# Patient Record
Sex: Female | Born: 1955 | Race: White | Hispanic: No | Marital: Married | Smoking: Former smoker
Health system: Southern US, Community
[De-identification: ages and names within clinical notes are randomized; demographics above are authoritative.]

## PROBLEM LIST (undated history)

## (undated) DIAGNOSIS — M5030 Other cervical disc degeneration, unspecified cervical region: Secondary | ICD-10-CM

## (undated) DIAGNOSIS — G8929 Other chronic pain: Secondary | ICD-10-CM

## (undated) DIAGNOSIS — F329 Major depressive disorder, single episode, unspecified: Secondary | ICD-10-CM

## (undated) DIAGNOSIS — R269 Unspecified abnormalities of gait and mobility: Secondary | ICD-10-CM

## (undated) DIAGNOSIS — M549 Dorsalgia, unspecified: Secondary | ICD-10-CM

## (undated) DIAGNOSIS — M810 Age-related osteoporosis without current pathological fracture: Secondary | ICD-10-CM

## (undated) DIAGNOSIS — F32A Depression, unspecified: Secondary | ICD-10-CM

## (undated) DIAGNOSIS — M1990 Unspecified osteoarthritis, unspecified site: Secondary | ICD-10-CM

## (undated) DIAGNOSIS — F419 Anxiety disorder, unspecified: Secondary | ICD-10-CM

## (undated) DIAGNOSIS — M542 Cervicalgia: Secondary | ICD-10-CM

## (undated) HISTORY — DX: Unspecified osteoarthritis, unspecified site: M19.90

## (undated) HISTORY — PX: HERNIA REPAIR: SHX51

## (undated) HISTORY — DX: Major depressive disorder, single episode, unspecified: F32.9

## (undated) HISTORY — PX: ABDOMINAL HYSTERECTOMY: SHX81

## (undated) HISTORY — DX: Dorsalgia, unspecified: M54.9

## (undated) HISTORY — DX: Anxiety disorder, unspecified: F41.9

## (undated) HISTORY — DX: Unspecified abnormalities of gait and mobility: R26.9

## (undated) HISTORY — DX: Depression, unspecified: F32.A

## (undated) HISTORY — PX: TUBAL LIGATION: SHX77

---

## 1998-04-02 ENCOUNTER — Other Ambulatory Visit: Admission: RE | Admit: 1998-04-02 | Discharge: 1998-04-02 | Payer: Self-pay | Admitting: Gynecology

## 1999-03-23 ENCOUNTER — Emergency Department (HOSPITAL_COMMUNITY): Admission: EM | Admit: 1999-03-23 | Discharge: 1999-03-23 | Payer: Self-pay | Admitting: *Deleted

## 1999-05-23 ENCOUNTER — Other Ambulatory Visit: Admission: RE | Admit: 1999-05-23 | Discharge: 1999-05-23 | Payer: Self-pay | Admitting: Gynecology

## 1999-07-08 ENCOUNTER — Encounter: Admission: RE | Admit: 1999-07-08 | Discharge: 1999-07-08 | Payer: Self-pay | Admitting: Gynecology

## 2000-05-20 ENCOUNTER — Other Ambulatory Visit: Admission: RE | Admit: 2000-05-20 | Discharge: 2000-05-20 | Payer: Self-pay | Admitting: Gynecology

## 2000-07-10 ENCOUNTER — Encounter: Payer: Self-pay | Admitting: Gynecology

## 2000-07-10 ENCOUNTER — Encounter: Admission: RE | Admit: 2000-07-10 | Discharge: 2000-07-10 | Payer: Self-pay | Admitting: Gynecology

## 2001-05-12 ENCOUNTER — Other Ambulatory Visit: Admission: RE | Admit: 2001-05-12 | Discharge: 2001-05-12 | Payer: Self-pay | Admitting: Gynecology

## 2001-07-14 ENCOUNTER — Encounter: Payer: Self-pay | Admitting: Gynecology

## 2001-07-14 ENCOUNTER — Encounter: Admission: RE | Admit: 2001-07-14 | Discharge: 2001-07-14 | Payer: Self-pay | Admitting: Gynecology

## 2002-05-19 ENCOUNTER — Encounter: Payer: Self-pay | Admitting: General Surgery

## 2002-05-19 ENCOUNTER — Other Ambulatory Visit: Admission: RE | Admit: 2002-05-19 | Discharge: 2002-05-19 | Payer: Self-pay | Admitting: Gynecology

## 2002-05-19 ENCOUNTER — Ambulatory Visit (HOSPITAL_COMMUNITY): Admission: RE | Admit: 2002-05-19 | Discharge: 2002-05-19 | Payer: Self-pay | Admitting: General Surgery

## 2002-07-19 ENCOUNTER — Encounter: Payer: Self-pay | Admitting: Gynecology

## 2002-07-19 ENCOUNTER — Encounter: Admission: RE | Admit: 2002-07-19 | Discharge: 2002-07-19 | Payer: Self-pay | Admitting: Gynecology

## 2003-07-27 ENCOUNTER — Encounter: Admission: RE | Admit: 2003-07-27 | Discharge: 2003-07-27 | Payer: Self-pay | Admitting: Gynecology

## 2003-11-02 ENCOUNTER — Other Ambulatory Visit: Admission: RE | Admit: 2003-11-02 | Discharge: 2003-11-02 | Payer: Self-pay | Admitting: Gynecology

## 2004-09-12 ENCOUNTER — Encounter: Admission: RE | Admit: 2004-09-12 | Discharge: 2004-09-12 | Payer: Self-pay | Admitting: Gynecology

## 2004-11-04 ENCOUNTER — Other Ambulatory Visit: Admission: RE | Admit: 2004-11-04 | Discharge: 2004-11-04 | Payer: Self-pay | Admitting: Gynecology

## 2005-09-24 ENCOUNTER — Encounter: Admission: RE | Admit: 2005-09-24 | Discharge: 2005-09-24 | Payer: Self-pay | Admitting: Gynecology

## 2007-12-29 ENCOUNTER — Other Ambulatory Visit: Admission: RE | Admit: 2007-12-29 | Discharge: 2007-12-29 | Payer: Self-pay | Admitting: Obstetrics and Gynecology

## 2009-02-06 ENCOUNTER — Other Ambulatory Visit: Admission: RE | Admit: 2009-02-06 | Discharge: 2009-02-06 | Payer: Self-pay | Admitting: Obstetrics and Gynecology

## 2010-03-04 ENCOUNTER — Other Ambulatory Visit: Admission: RE | Admit: 2010-03-04 | Discharge: 2010-03-04 | Payer: Self-pay | Admitting: Obstetrics and Gynecology

## 2010-05-22 ENCOUNTER — Ambulatory Visit (HOSPITAL_COMMUNITY): Admission: RE | Admit: 2010-05-22 | Discharge: 2010-05-22 | Payer: Self-pay | Admitting: Internal Medicine

## 2010-07-15 ENCOUNTER — Ambulatory Visit (HOSPITAL_COMMUNITY): Admission: RE | Admit: 2010-07-15 | Discharge: 2010-07-15 | Payer: Self-pay | Admitting: Obstetrics and Gynecology

## 2010-10-13 ENCOUNTER — Encounter: Payer: Self-pay | Admitting: Internal Medicine

## 2010-11-04 ENCOUNTER — Encounter: Payer: Self-pay | Admitting: Orthopedic Surgery

## 2010-11-04 ENCOUNTER — Emergency Department (HOSPITAL_COMMUNITY): Payer: Self-pay

## 2010-11-04 ENCOUNTER — Emergency Department (HOSPITAL_COMMUNITY)
Admission: EM | Admit: 2010-11-04 | Discharge: 2010-11-04 | Disposition: A | Discharge status: Home / Self Care | Payer: Self-pay | Attending: Emergency Medicine | Admitting: Emergency Medicine

## 2010-11-04 ENCOUNTER — Encounter (HOSPITAL_COMMUNITY): Payer: Self-pay | Admitting: Radiology

## 2010-11-04 DIAGNOSIS — W19XXXA Unspecified fall, initial encounter: Secondary | ICD-10-CM | POA: Insufficient documentation

## 2010-11-04 DIAGNOSIS — M129 Arthropathy, unspecified: Secondary | ICD-10-CM | POA: Insufficient documentation

## 2010-11-04 DIAGNOSIS — F411 Generalized anxiety disorder: Secondary | ICD-10-CM | POA: Insufficient documentation

## 2010-11-04 DIAGNOSIS — E7800 Pure hypercholesterolemia, unspecified: Secondary | ICD-10-CM | POA: Insufficient documentation

## 2010-11-04 DIAGNOSIS — Z79899 Other long term (current) drug therapy: Secondary | ICD-10-CM | POA: Insufficient documentation

## 2010-11-04 DIAGNOSIS — S92009A Unspecified fracture of unspecified calcaneus, initial encounter for closed fracture: Secondary | ICD-10-CM | POA: Insufficient documentation

## 2010-11-06 ENCOUNTER — Ambulatory Visit (INDEPENDENT_AMBULATORY_CARE_PROVIDER_SITE_OTHER): Payer: Self-pay | Admitting: Orthopedic Surgery

## 2010-11-06 ENCOUNTER — Encounter: Payer: Self-pay | Admitting: Orthopedic Surgery

## 2010-11-06 DIAGNOSIS — S92009A Unspecified fracture of unspecified calcaneus, initial encounter for closed fracture: Secondary | ICD-10-CM

## 2010-11-13 NOTE — Assessment & Plan Note (Signed)
Summary: ap er 11/04/10 fx rt ankle xr there./bcbs/bsf   Visit Type:  Initial Consult Referring Provider:  emergency room  CC:  RIGHT ankle and foot pain .  History of Present Illness: This is a 55 yo female who stepped off a curb at [facility] injured her foot and ankle.     Date of injury was February 13  Initial treatment at the emergency room. She was started on oxycodone, she was placed in a splint.  She had x-rays and a CT scan, which shows a fracture of the distal portion of the calcaneus with avulsion injury of the dorsal talus and also fracture of the cuboid area, which are nondisplaced.  Symptoms, sharp, dull, throbbing, stabbing, burning pain. Intensity, 7/10, timing, intermittent. Pain is worse in the afternoon, pain is improved with medication and ice. Associated symptoms or bruising, numbness, tingling, and swelling.  Past History:  Past Medical History: back pain, degenerative disc disease.  Depression.  Anxiety  Past Surgical History: cesarean section, tubal ligation.   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 No alcohol use. No street drug use.  GED was completed.  Review of Systems Constitutional:  Denies weight loss, weight gain, fever, chills, and fatigue. Cardiovascular:  Denies chest pain, palpitations, fainting, and murmurs. Respiratory:  Denies short of breath, wheezing, couch, tightness, pain on inspiration, and snoring . Gastrointestinal:  Denies heartburn, nausea, vomiting, diarrhea, constipation, and blood in your stools. Genitourinary:  Denies frequency, urgency, difficulty urinating, painful urination, flank pain, and bleeding in urine. Neurologic:  Complains of numbness and tingling; denies unsteady gait, dizziness, tremors, and seizure. Musculoskeletal:  Denies joint pain, swelling, instability, stiffness, redness, heat, and muscle pain. Endocrine:  Complains of  excessive thirst; denies exessive urination and heat or cold intolerance. Psychiatric:  Denies nervousness, depression, anxiety, and hallucinations. Skin:  Denies changes in the skin, poor healing, rash, itching, and redness. HEENT:  Denies blurred or double vision, eye pain, redness, and watering. Immunology:  Denies seasonal allergies, sinus problems, and allergic to bee stings. Hemoatologic:  Complains of easy bleeding; denies brusing.  Physical Exam  Msk:  The patient is well developed and nourished, with normal grooming and hygiene. The body habitus is  normal  Pulses:  The pulses and perfusion were normal with normal color, temperature     Neurologic:  The coordination and sensation were normal  The reflexes were normal   Skin:  ECCHYMOSIS right foot ankle + swelling  Psych:  alert and cooperative; normal mood and affect; normal attention span and concentration Additional Exam:  right ankle foot   swelling and tenderness lateral ankle and foot   compartments of the foot are soft  ankle ROM 0-20 degrees  ankle  stability is normal   motor is normal      Impression & Recommendations:  Problem # 1:  CLOSED FRACTURE OF CALCANEUS (ICD-825.0) Assessment New The x-rays and CT were done at [facility]. The report and the films have been reviewed.  non op fracture calcaneous distal incl avulsion calc-cub joint   Orders: New Patient Level III (16109) EMR Misc Charge Code [facility])  Medications Added to Medication List This Visit: 1)  Norco 5-325 Mg Tabs (Hydrocodone-acetaminophen) .Marland Kitchen.. 1-2 q 4-6 as needed pain  Patient Instructions: 1)  FEB 27TH CAST APPLICATION RIGHT FOOT/ANKLE 2)  NO WEIGHT ON RIGHT FOOT Prescriptions: NORCO 5-325 MG TABS (HYDROCODONE-ACETAMINOPHEN) 1-2 q 4-6 as needed pain  #84 x 2  Entered and Authorized by:   Fuller Canada MD   Signed by:   Fuller Canada MD on 11/06/2010   Method used:   Print then Give to Patient   RxID:    [id number]    Orders Added: 1)  New Patient Level III [14782] 2)  EMR Misc Charge Code [EMRMisc]

## 2010-11-18 ENCOUNTER — Ambulatory Visit (INDEPENDENT_AMBULATORY_CARE_PROVIDER_SITE_OTHER): Payer: BC Managed Care – PPO | Admitting: Orthopedic Surgery

## 2010-11-18 ENCOUNTER — Encounter: Payer: Self-pay | Admitting: Orthopedic Surgery

## 2010-11-18 DIAGNOSIS — S92009A Unspecified fracture of unspecified calcaneus, initial encounter for closed fracture: Secondary | ICD-10-CM

## 2010-11-21 ENCOUNTER — Encounter: Payer: Self-pay | Admitting: Orthopedic Surgery

## 2010-11-28 NOTE — Medication Information (Signed)
Summary: Tax adviser   Imported By: Cammie Sickle 11/18/2010 18:34:34  _____________________________________________________________________  External Attachment:    Type:   Image     Comment:   External Document

## 2010-11-28 NOTE — Assessment & Plan Note (Signed)
Summary: RE-CK/CAST APPLICATION RT FOOT/ANKLE/FX CARE/SELF PAY/CAF   Visit Type:  Follow-up Referring Provider:  emergency room  CC:  right foot fracture.  History of Present Illness: I saw Nicole Welch in the office today for a followup visit.  She is a 55 years old woman with the complaint of:  right foot fracture  Date of injury was February 13  Initial treatment splint  She had x-rays and a CT scan, which shows a fracture of the distal portion of the calcaneus with avulsion injury of the dorsal talus and also fracture of the cuboid area, which are nondisplaced.  Medications: Norco 5mg .  Today, apply cast.  Complaints: none  Her foot looks acceptable to place a short leg nonweightbearing cast which was applied    Other Orders: Post-Op Check (16109) Short Leg Cast (60454)  Patient Instructions: 1)  returns in 4 weeks for x-rays of her foot   Orders Added: 1)  Post-Op Check [99024] 2)  Short Leg Cast [29405]

## 2010-12-05 ENCOUNTER — Encounter: Payer: Self-pay | Admitting: Orthopedic Surgery

## 2010-12-06 ENCOUNTER — Encounter: Payer: Self-pay | Admitting: Orthopedic Surgery

## 2010-12-19 ENCOUNTER — Ambulatory Visit (INDEPENDENT_AMBULATORY_CARE_PROVIDER_SITE_OTHER): Payer: BC Managed Care – PPO | Admitting: Orthopedic Surgery

## 2010-12-19 DIAGNOSIS — S92009A Unspecified fracture of unspecified calcaneus, initial encounter for closed fracture: Secondary | ICD-10-CM

## 2010-12-19 NOTE — Letter (Signed)
Summary: History form  History form   Imported By: Cammie Sickle 12/08/2010 12:36:09  _____________________________________________________________________  External Attachment:    Type:   Image     Comment:   External Document

## 2010-12-19 NOTE — Progress Notes (Signed)
Followup status post fracture of the foot.  She had a CT scan which showed a fracture of the distal portion of the calcaneus with avulsion of the dorsal talus and fracture the cuboid area nondisplaced he was in a cast for 6 weeks.  Injury date was February 13.  Repeat x-rays today show fracture resolution.  Advised to him progressively weight-bear as tolerated follow up as needed  Separate identifiable x-ray report AP lateral of the RIGHT foot including oblique for fracture followup  Previously noted fractures have healed laminas normal  Impression normal x-ray  Patient advised to follow up as needed

## 2010-12-19 NOTE — Patient Instructions (Signed)
Wear regular shoe/sneaker   Use walker for 1 week

## 2010-12-19 NOTE — Letter (Signed)
Summary: Medical record request Disability Determin  Medical record request Disability Determin   Imported By: Cammie Sickle 12/09/2010 22:09:00  _____________________________________________________________________  External Attachment:    Type:   Image     Comment:   External Document

## 2010-12-19 NOTE — Letter (Signed)
Summary: Medical record request [facility]  Medical record request DLG   Imported By: Cammie Sickle 12/09/2010 22:09:38  _____________________________________________________________________  External Attachment:    Type:   Image     Comment:   External Document

## 2010-12-24 ENCOUNTER — Telehealth: Payer: Self-pay | Admitting: Orthopedic Surgery

## 2010-12-24 ENCOUNTER — Encounter: Payer: Self-pay | Admitting: *Deleted

## 2011-01-29 ENCOUNTER — Telehealth: Payer: Self-pay | Admitting: Orthopedic Surgery

## 2011-01-29 NOTE — Telephone Encounter (Signed)
Patient called to ask about ankle,has had some swelling, "not all the time".  Last office visit "As needed" as ankle fracture healed. Any recommendations other than rest, ice, elevate?  Home ph# [phone]

## 2011-01-29 NOTE — Telephone Encounter (Signed)
No other suggestions

## 2011-02-11 ENCOUNTER — Ambulatory Visit: Payer: BC Managed Care – PPO | Admitting: Orthopedic Surgery

## 2011-07-04 NOTE — Telephone Encounter (Signed)
Closed this encounter since it is from 12/24/10

## 2011-07-14 ENCOUNTER — Other Ambulatory Visit (HOSPITAL_COMMUNITY): Payer: Self-pay | Admitting: Internal Medicine

## 2011-07-14 DIAGNOSIS — Z139 Encounter for screening, unspecified: Secondary | ICD-10-CM

## 2011-07-17 ENCOUNTER — Ambulatory Visit (HOSPITAL_COMMUNITY)
Admission: RE | Admit: 2011-07-17 | Discharge: 2011-07-17 | Disposition: A | Discharge status: Home / Self Care | Payer: Medicaid Other | Source: Ambulatory Visit | Attending: Internal Medicine | Admitting: Internal Medicine

## 2011-07-17 DIAGNOSIS — Z1231 Encounter for screening mammogram for malignant neoplasm of breast: Secondary | ICD-10-CM | POA: Insufficient documentation

## 2011-07-17 DIAGNOSIS — Z139 Encounter for screening, unspecified: Secondary | ICD-10-CM

## 2011-08-06 ENCOUNTER — Encounter: Payer: Self-pay | Admitting: Orthopedic Surgery

## 2011-08-06 ENCOUNTER — Ambulatory Visit (INDEPENDENT_AMBULATORY_CARE_PROVIDER_SITE_OTHER): Payer: Medicaid Other | Admitting: Orthopedic Surgery

## 2011-08-06 VITALS — BP 104/78 | Ht 62.0 in | Wt 169.0 lb

## 2011-08-06 DIAGNOSIS — M25579 Pain in unspecified ankle and joints of unspecified foot: Secondary | ICD-10-CM

## 2011-08-06 NOTE — Progress Notes (Signed)
   Visit previous RIGHT foot injury.  She had a CT scan which showed a fracture of the distal portion of the calcaneus with avulsion of the dorsal talus and fracture the cuboid area nondisplaced he was in a cast for 6 weeks  Review of systems negative.  Exam shows normal ambulation.  Vital signs as recorded. Ankle range of motion is normal. The foot is essentially nontender with no deformity except for dorsally over the painless. The foot is stable. Muscle tone is normal. Strength assessment normal. Alignment, again, is normal.  Recommend Spenco foot orthotics and Aspercreme.  Diagnosis posttraumatic arthrosis

## 2011-08-06 NOTE — Patient Instructions (Signed)
Apply aspercreme to your foot 3 x a day for 6 weeks   Wear orthotics for 6 months

## 2011-08-07 ENCOUNTER — Other Ambulatory Visit (HOSPITAL_COMMUNITY): Payer: Self-pay | Admitting: Internal Medicine

## 2011-08-07 DIAGNOSIS — Z139 Encounter for screening, unspecified: Secondary | ICD-10-CM

## 2011-08-12 ENCOUNTER — Ambulatory Visit (HOSPITAL_COMMUNITY)
Admission: RE | Admit: 2011-08-12 | Discharge: 2011-08-12 | Disposition: A | Discharge status: Home / Self Care | Payer: Medicaid Other | Source: Ambulatory Visit | Attending: Internal Medicine | Admitting: Internal Medicine

## 2011-08-12 DIAGNOSIS — M818 Other osteoporosis without current pathological fracture: Secondary | ICD-10-CM | POA: Insufficient documentation

## 2011-08-12 DIAGNOSIS — Z139 Encounter for screening, unspecified: Secondary | ICD-10-CM

## 2011-08-12 DIAGNOSIS — Z780 Asymptomatic menopausal state: Secondary | ICD-10-CM | POA: Insufficient documentation

## 2011-10-07 ENCOUNTER — Other Ambulatory Visit (HOSPITAL_COMMUNITY)
Admission: RE | Admit: 2011-10-07 | Discharge: 2011-10-07 | Disposition: A | Discharge status: Home / Self Care | Payer: Medicaid Other | Source: Ambulatory Visit | Attending: Obstetrics and Gynecology | Admitting: Obstetrics and Gynecology

## 2011-10-07 ENCOUNTER — Other Ambulatory Visit: Payer: Self-pay | Admitting: Adult Health

## 2011-10-07 DIAGNOSIS — Z01419 Encounter for gynecological examination (general) (routine) without abnormal findings: Secondary | ICD-10-CM | POA: Insufficient documentation

## 2012-01-05 ENCOUNTER — Other Ambulatory Visit (HOSPITAL_COMMUNITY): Payer: Self-pay | Admitting: Internal Medicine

## 2012-01-05 DIAGNOSIS — M549 Dorsalgia, unspecified: Secondary | ICD-10-CM

## 2012-01-08 ENCOUNTER — Other Ambulatory Visit (HOSPITAL_COMMUNITY): Payer: Medicaid Other

## 2012-01-09 ENCOUNTER — Ambulatory Visit (HOSPITAL_COMMUNITY)
Admission: RE | Admit: 2012-01-09 | Discharge: 2012-01-09 | Disposition: A | Discharge status: Home / Self Care | Payer: Medicaid Other | Source: Ambulatory Visit | Attending: Internal Medicine | Admitting: Internal Medicine

## 2012-01-09 DIAGNOSIS — M545 Low back pain, unspecified: Secondary | ICD-10-CM | POA: Insufficient documentation

## 2012-01-09 DIAGNOSIS — M549 Dorsalgia, unspecified: Secondary | ICD-10-CM

## 2012-01-09 DIAGNOSIS — M5126 Other intervertebral disc displacement, lumbar region: Secondary | ICD-10-CM | POA: Insufficient documentation

## 2012-01-09 DIAGNOSIS — M79609 Pain in unspecified limb: Secondary | ICD-10-CM | POA: Insufficient documentation

## 2012-02-24 ENCOUNTER — Ambulatory Visit (HOSPITAL_COMMUNITY)
Admission: RE | Admit: 2012-02-24 | Discharge: 2012-02-24 | Disposition: A | Discharge status: Home / Self Care | Payer: Medicaid Other | Source: Ambulatory Visit | Attending: Physician Assistant | Admitting: Physician Assistant

## 2012-02-24 ENCOUNTER — Ambulatory Visit (HOSPITAL_COMMUNITY): Payer: Self-pay | Admitting: Physical Therapy

## 2012-02-24 DIAGNOSIS — IMO0001 Reserved for inherently not codable concepts without codable children: Secondary | ICD-10-CM | POA: Insufficient documentation

## 2012-02-24 DIAGNOSIS — M545 Low back pain, unspecified: Secondary | ICD-10-CM | POA: Insufficient documentation

## 2012-02-24 DIAGNOSIS — M6281 Muscle weakness (generalized): Secondary | ICD-10-CM | POA: Insufficient documentation

## 2012-02-24 NOTE — Evaluation (Signed)
Physical Therapy Evaluation  Patient Details  Name: Nicole Welch MRN: [id number] Date of Birth: Apr 11, 1956  Today's Date: 02/24/2012 Time: 4098-1191 PT Time Calculation (min): 34 min  Visit#: 1  of 8   Re-eval: 03/25/12    Authorization: medicaid  Past Medical History:  Past Medical History  Diagnosis Date  . Back pain   . DJD (degenerative joint disease)   . Depression   . Anxiety   . Gait abnormality    Past Surgical History:  Past Surgical History  Procedure Date  . Cesarean section   . Tubal ligation     Subjective  [facility] ATTNClaris Gower [address] [place], [place] [address] Phone: [phone] Fax: [phone]   INITIAL EVALUATION  Physical Therapy   Patient Name: Nicole Welch Date Of Birth: [birth year] Guardian Name: N/A Treatment ICD-9 Code: 7242 Address: [address] Date of Evaluation: 02/24/2012 [place], [place] [address] Requested Dates of Service: 02/26/2012 - 03/25/2012 Therapy History: No known therapy for this problem Reason For Referral: Recipient has a new injury, disease or condition Prior Level of Function: Independent/Modified Independent with all ADLs (OT/PT) or Audition, Communication, Voice and/or Swallowing Skills (ST/AUD) Additional Medical History: Ms. Delamora states that she has been having low back pain for 15 years but it has gotten worse lately. The patient states that both her legs go numb all the way down to the feet. She states that she has pain from the back of her neck to her tailbone in the center of the back. She has been referred to PT to improve her pain and functional mobility. Prematurity: N/A Severity Level: N/A Treatment Goals:  1. Goal: Pt to be able to sit through church service without increase pain  Baseline: Pt has increased pain when sitting through church service.  Duration: 4 Week(s)  2. Goal: Pt pain level to be no greater than a 5.  Baseline: Pain level varies from a 0  to a 9/10  Duration: 4 Week(s)  3. Goal: Pt to be able to complete her housework without stopping  Baseline: Pt states she has to take multiple breaks when she does her house work.  Duration: 4 Week(s)  4. Goal: DF R 3/5 L 4/5; knee ext B 3+/5; ham B 4/5; hip flex B 3-/5; AB 4/5 ; ext 3+/5 ;  Baseline: improve mm 1 grade to decrease pain  Duration: 4 Week(s)  5. Goal: fb decreased 30% with return more painful than going down; ext decreased 15%; with both activities pt lost balance  Baseline: Lumbar ROM wnl  Duration: 4 Week(s)  Treatment Frequency/Duration: 2x/week for 4 weeks  Units per visit: N/A  Additional Information: N/A    Exercise/Treatments Supine Ab Set: 5 reps Glut Set: 5 reps Other Supine Lumbar Exercises: Kegal x 10  Prone  Other Prone Lumbar Exercises: anterior tilt x 10     Physical Therapy Assessment and Plan PT Assessment and Plan Clinical Impression Statement: Pt with signs and symptoms of instability who will benefit from skilled therapy to decrease symptom of pain.   Pt will benefit from skilled therapeutic intervention in order to improve on the following deficits: Pain Rehab Potential: Good PT Frequency: Min 2X/week PT Duration: 4 weeks PT Plan: begin bent knee raise, bridge, clam and prone heel squeeze .        Problem List Patient Active Problem List  Diagnoses  . CLOSED FRACTURE OF CALCANEUS    PT - End of  Session Activity Tolerance: Patient tolerated treatment well General Behavior During Session: [facility] for tasks performed Cognition: [facility] for tasks performed   Savi Lastinger,CINDY 02/24/2012, 4:23 PM  Physician Documentation Your signature is required to indicate approval of the treatment plan as stated above.  Please sign and either send electronically or make a copy of this report for your files and return this physician signed original.   Please mark one 1.__approve of plan  2. ___approve of plan with the following  conditions.   ______________________________                                                          _____________________ Physician Signature                                                                                                             Date

## 2012-03-04 ENCOUNTER — Ambulatory Visit (HOSPITAL_COMMUNITY): Payer: Medicaid Other | Admitting: Physical Therapy

## 2012-03-12 ENCOUNTER — Ambulatory Visit (HOSPITAL_COMMUNITY)
Admission: RE | Admit: 2012-03-12 | Discharge: 2012-03-12 | Disposition: A | Discharge status: Home / Self Care | Payer: Medicaid Other | Source: Ambulatory Visit | Attending: Physical Therapy | Admitting: Physical Therapy

## 2012-03-12 NOTE — Progress Notes (Signed)
Physical Therapy Treatment Patient Details  Name: Nicole Welch MRN: [id number] Date of Birth: 07-14-1956  Today's Date: 03/12/2012 Time: 4098-1191 PT Time Calculation (min): 41 min  Visit#: 2  of 8   Re-eval: 03/25/12    Authorization: medicaid  Authorization Time Period:    Authorization Visit#: 1  of 3    Subjective: Symptoms/Limitations Symptoms: Pt request not to do supine ex due to increased pain in this position.  Pt c/o of neck pain demonstrates significant decreased ROM of cervical spine. Pain Assessment Currently in Pain?: Yes Pain Score:   8 Pain Location: Back Pain Orientation: Mid Pain Type: Chronic pain  Precautions/Restrictions     Exercise/Treatments Aerobic Stationary Bike:  (7' at 2.5)    Standing Scapular Retraction: Strengthening;Theraband Theraband Level (Scapular Retraction): Level 3 (Green) Row: 10 reps Shoulder Extension: Strengthening;10 reps;Theraband Theraband Level (Shoulder Extension): Level 3 (Green) Seated Long [employer] on Chair: 10 reps Other Seated Lumbar Exercises:  (c-retraction, scapular retraction, ab,glut,+adduction x10) Other Seated Lumbar Exercises:  (c rom ex x10 for ext and SB)     Modalities Modalities: Moist Heat Moist Heat Therapy Number Minutes Moist Heat: 20 Minutes Moist Heat Location:  (back while sitting doing ex)  Physical Therapy Assessment and Plan PT Assessment and Plan Clinical Impression Statement: Pt tolerated ex while sitting needing multiple verbal cuing for proper technique PT Plan: begin hip roll out/in as well as prone with two pillows for heel squeeze; SLR; chin tuck head raise.    Goals    Problem List Patient Active Problem List  Diagnosis  . CLOSED FRACTURE OF CALCANEUS    General Behavior During Session: [facility] for tasks performed Cognition: [facility] for tasks performed  GP No functional reporting required  Angelena Sand,CINDY 03/12/2012, 3:21 PM

## 2012-03-16 ENCOUNTER — Ambulatory Visit (HOSPITAL_COMMUNITY)
Admission: RE | Admit: 2012-03-16 | Discharge: 2012-03-16 | Disposition: A | Discharge status: Home / Self Care | Payer: Medicaid Other | Source: Ambulatory Visit | Attending: Internal Medicine | Admitting: Internal Medicine

## 2012-03-16 NOTE — Progress Notes (Signed)
Physical Therapy Treatment Patient Details  Name: YARISBEL MIRANDA MRN: [id number] Date of Birth: [birth year]  Today's Date: 03/16/2012 Time: 4098-1191 PT Time Calculation (min): 41 min  Visit#: 3  of 8   Re-eval: 03/25/12    Authorization: Medicaid  Authorization Time Period: 02/26/12-03/25/12  Authorization Visit#: 2  of 3    Subjective: Symptoms/Limitations Symptoms: Pt states her R ankle is bothering her and her foot is numb.  Pt has prior fx.   Pain Assessment Pain Score:   6 Pain Location: Back Pain Orientation: Mid;Lower;Right;Left Pain Type: Chronic pain  Precautions/Restrictions     Exercise/Treatments     Aerobic Stationary Bike: 8' 2.5   Standing Scapular Retraction: Strengthening;Theraband Theraband Level (Scapular Retraction): Level 3 (Green) Row: 10 reps Shoulder Extension: Strengthening;10 reps;Theraband Theraband Level (Shoulder Extension): Level 3 (Green) Seated Long [employer] on Chair: 10 reps Other Seated Lumbar Exercises:  (c-retraction, scapular retraction, ab,glut,+adduction x10) Other Seated Lumbar Exercises:  (c rom ex x10 for ext and SB)   Prone  Single Arm Raise: Right;Left;5 reps Straight Leg Raise: 10 reps Other Prone Lumbar Exercises: scapular rows .10    Modalities Modalities: Moist Heat Moist Heat Therapy Number Minutes Moist Heat: 20 Minutes Moist Heat Location:  (back while in chair with ex and prone)  Physical Therapy Assessment and Plan PT Assessment and Plan Clinical Impression Statement: Pt unable to add sitting roll hips in/out due to pt stating this hurt her R ankle but  completed prone ex with verbal cue well PT Plan: give pt t-band and advance HEP to complete ex at home, attempt hip rolls.  Next treatment is pt last medicaid covered treatment assess whether further visits are needed     Problem List Patient Active Problem List  Diagnosis  . CLOSED FRACTURE OF CALCANEUS    PT - End of Session Activity Tolerance:  Patient tolerated treatment well General Behavior During Session: [facility] for tasks performed Cognition: [facility] for tasks performed  GP No functional reporting required  Loana Salvaggio,CINDY 03/16/2012, 3:12 PM

## 2012-03-23 ENCOUNTER — Ambulatory Visit (HOSPITAL_COMMUNITY): Payer: Medicaid Other | Admitting: Physical Therapy

## 2012-04-02 ENCOUNTER — Other Ambulatory Visit (HOSPITAL_COMMUNITY): Payer: Self-pay | Admitting: Internal Medicine

## 2012-04-02 DIAGNOSIS — M542 Cervicalgia: Secondary | ICD-10-CM

## 2012-04-02 DIAGNOSIS — M5412 Radiculopathy, cervical region: Secondary | ICD-10-CM

## 2012-04-05 ENCOUNTER — Ambulatory Visit (HOSPITAL_COMMUNITY)
Admission: RE | Admit: 2012-04-05 | Discharge: 2012-04-05 | Disposition: A | Discharge status: Home / Self Care | Payer: Medicaid Other | Source: Ambulatory Visit | Attending: Internal Medicine | Admitting: Internal Medicine

## 2012-04-05 DIAGNOSIS — M542 Cervicalgia: Secondary | ICD-10-CM | POA: Insufficient documentation

## 2012-04-05 DIAGNOSIS — M5126 Other intervertebral disc displacement, lumbar region: Secondary | ICD-10-CM | POA: Insufficient documentation

## 2012-04-05 DIAGNOSIS — M5412 Radiculopathy, cervical region: Secondary | ICD-10-CM

## 2012-04-25 ENCOUNTER — Emergency Department (HOSPITAL_COMMUNITY)
Admission: EM | Admit: 2012-04-25 | Discharge: 2012-04-25 | Disposition: A | Discharge status: Home / Self Care | Payer: Medicaid Other | Attending: Emergency Medicine | Admitting: Emergency Medicine

## 2012-04-25 ENCOUNTER — Encounter (HOSPITAL_COMMUNITY): Payer: Self-pay | Admitting: Emergency Medicine

## 2012-04-25 ENCOUNTER — Emergency Department (HOSPITAL_COMMUNITY): Payer: Medicaid Other

## 2012-04-25 DIAGNOSIS — S93409A Sprain of unspecified ligament of unspecified ankle, initial encounter: Secondary | ICD-10-CM

## 2012-04-25 DIAGNOSIS — Y92009 Unspecified place in unspecified non-institutional (private) residence as the place of occurrence of the external cause: Secondary | ICD-10-CM | POA: Insufficient documentation

## 2012-04-25 DIAGNOSIS — F17200 Nicotine dependence, unspecified, uncomplicated: Secondary | ICD-10-CM | POA: Insufficient documentation

## 2012-04-25 DIAGNOSIS — S8990XA Unspecified injury of unspecified lower leg, initial encounter: Secondary | ICD-10-CM | POA: Insufficient documentation

## 2012-04-25 DIAGNOSIS — W19XXXA Unspecified fall, initial encounter: Secondary | ICD-10-CM | POA: Insufficient documentation

## 2012-04-25 DIAGNOSIS — S99929A Unspecified injury of unspecified foot, initial encounter: Secondary | ICD-10-CM | POA: Insufficient documentation

## 2012-04-25 DIAGNOSIS — M1990 Unspecified osteoarthritis, unspecified site: Secondary | ICD-10-CM | POA: Insufficient documentation

## 2012-04-25 NOTE — ED Provider Notes (Signed)
History     CSN: [id number]  Arrival date & time 04/25/12  1545   First MD Initiated Contact with Patient 04/25/12 1636      Chief Complaint  Patient presents with  . Ankle Pain    (Consider location/radiation/quality/duration/timing/severity/associated sxs/prior treatment) Patient is a 56 y.o. female presenting with ankle pain. The history is provided by the patient.  Ankle Pain  Incident onset: several  hours PTA. The incident occurred at home. The injury mechanism was torsion and a fall. The pain is present in the right ankle. The quality of the pain is described as aching. The pain is mild. The pain has been improving since onset. Pertinent negatives include no numbness, no inability to bear weight, no loss of motion, no muscle weakness, no loss of sensation and no tingling. She reports no foreign bodies present. The symptoms are aggravated by activity, bearing weight and palpation. Treatments tried: oral narcotic. The treatment provided moderate relief.    Past Medical History  Diagnosis Date  . Back pain   . DJD (degenerative joint disease)   . Depression   . Anxiety   . Gait abnormality     Past Surgical History  Procedure Date  . Cesarean section   . Tubal ligation     Family History  Problem Relation Age of Onset  . Heart disease      family history   . Arthritis      family history     History  Substance Use Topics  . Smoking status: Current Everyday Smoker    Types: Cigarettes  . Smokeless tobacco: Not on file  . Alcohol Use: No    OB History    Grav Para Term Preterm Abortions TAB SAB Ect Mult Living                  Review of Systems  Constitutional: Negative for fever and chills.  HENT: Negative for neck pain.   Genitourinary: Negative for dysuria and difficulty urinating.  Musculoskeletal: Positive for joint swelling and arthralgias. Negative for back pain.  Skin: Negative for color change and wound.  Neurological: Negative for dizziness,  tingling and numbness.  All other systems reviewed and are negative.    Allergies  Review of patient's allergies indicates no known allergies.  Home Medications   Current Outpatient Rx  Name Route Sig Dispense Refill  . ALPRAZOLAM 1 MG PO TABS      . CEPHALEXIN 500 MG PO CAPS      . CYCLOBENZAPRINE HCL 10 MG PO TABS Oral Take 10 mg by mouth 3 (three) times daily as needed.      . MELOXICAM 15 MG PO TABS      . MIRTAZAPINE 15 MG PO TABS      . NITROFURANTOIN MONOHYD MACRO 100 MG PO CAPS      . OXYCODONE-ACETAMINOPHEN 5-325 MG PO TABS      . SIMVASTATIN 80 MG PO TABS Oral Take 80 mg by mouth at bedtime.        BP 154/76  Pulse 104  Temp 98.2 F (36.8 C) (Oral)  Resp 17  SpO2 97%  Physical Exam  Nursing note and vitals reviewed. Constitutional: She is oriented to person, place, and time. She appears well-developed and well-nourished. No distress.  HENT:  Head: Normocephalic and atraumatic.  Neck: Normal range of motion.  Cardiovascular: Normal rate, regular rhythm, normal heart sounds and intact distal pulses.   Pulmonary/Chest: Effort normal and breath sounds normal.  Musculoskeletal: She exhibits edema and tenderness.       Right ankle: She exhibits swelling. She exhibits normal range of motion, no ecchymosis, no deformity, no laceration and normal pulse. tenderness. Lateral malleolus tenderness found. No head of 5th metatarsal and no proximal fibula tenderness found. Achilles tendon normal.       Feet:       Right lateral ankle is ttp, mild STS is present.  ROM is preserved.  DP pulse is brisk, sensation intact.  No erythema, abrasion, bruising or deformity.  No proximal tenderness  Neurological: She is alert and oriented to person, place, and time. She exhibits normal muscle tone. Coordination normal.  Skin: Skin is warm and dry.    ED Course  Procedures (including critical care time)  Labs Reviewed - No data to display Dg Ankle Complete Right  04/25/2012   *RADIOLOGY REPORT*  Clinical Data: Twisted right ankle.  Joint pain.  RIGHT ANKLE - COMPLETE 3+ VIEW  Comparison: CT 11/04/2010.  Findings: Soft tissue hematoma is present adjacent to the lateral malleolus.  The ankle mortise is congruent.   Malleoli appear intact.  Cortical irregularity is present in the lateral calcaneus compatible with old anterior process of the calcaneus fracture.  No acute fracture identified. Mild cortical irregularity and lucency in the lateral talar dome compatible with tiny osteochondral lesion seen on prior CT.  IMPRESSION: No acute osseous abnormality.  Lateral malleolar soft tissue hematoma.  Lateral calcaneal irregularity compatible with previously seen anterior process of the calcaneus fracture.  Original Report Authenticated By: Andreas Newport, M.D.       ASO applied.  Pian improved, remains NV intact.     MDM    Pt has own walker at home.  Recommended RICE therapy.  Pt agrees to f/u with ortho if the sx's are not improving.    The patient appears reasonably screened and/or stabilized for discharge and I doubt any other medical condition or other [facility] requiring further screening, evaluation, or treatment in the ED at this time prior to discharge.       Natale Thoma L. Sherita Decoste, [place] 04/27/12 1352

## 2012-04-25 NOTE — ED Notes (Signed)
Pt tripped and fell and c/o pain to r ankle. Slight swelling noted. Nad. Has been ambulatory since fall. Took half of a pain pill pta.

## 2012-04-28 NOTE — ED Provider Notes (Signed)
Medical screening examination/treatment/procedure(s) were performed by non-physician practitioner and as supervising physician I was immediately available for consultation/collaboration.   Lyanne Co, MD 04/28/12 [phone]

## 2012-05-12 ENCOUNTER — Encounter: Payer: Self-pay | Admitting: Orthopedic Surgery

## 2012-05-12 ENCOUNTER — Ambulatory Visit (INDEPENDENT_AMBULATORY_CARE_PROVIDER_SITE_OTHER): Payer: Medicaid Other | Admitting: Orthopedic Surgery

## 2012-05-12 VITALS — BP 140/70 | Ht 62.0 in | Wt 153.0 lb

## 2012-05-12 DIAGNOSIS — S93409A Sprain of unspecified ligament of unspecified ankle, initial encounter: Secondary | ICD-10-CM

## 2012-05-12 NOTE — Patient Instructions (Signed)
activities as tolerated 

## 2012-05-12 NOTE — Progress Notes (Signed)
  Subjective:    Nicole Welch is a 56 y.o. female who presents with right ankle pain. Onset of the symptoms was 17 days ago . Inciting event: inverted while walking. Current symptoms include: ability to bear weight, but with some pain. Aggravating factors: none. Symptoms have stabilized. Patient has had prior ankle problems. Evaluation to date: plain films: normal. Treatment to date: brace which is effective. The following portions of the patient's history were reviewed and updated as appropriate: allergies, current medications, past family history, past medical history, past social history, past surgical history and problem list.   Review of Systems - Negative except no positive findings are listed  Objective:  The patient's overall appearance was normal. Grooming and hygiene were normal. She was alert, awake, and oriented x3. Mood and affect were normal. Ambulation was supported by a walker.  Normal. Neurovascular check.  BP 140/70  Ht 5\' 2"  (1.575 m)  Wt 153 lb (69.4 kg)  BMI 27.98 kg/m2 Right ankle:   no effusion, full range of motion, no tenderness. the dorsum of the foot was discolored from previous subcutaneous bleeding  Left ankle:   normal   Imaging: X-ray of the right ankle(s): no fracture, dislocation, swelling or degenerative changes noted    Assessment:    Ankle sprain    Plan:    Natural history and expected course discussed. Questions answered.

## 2012-05-17 ENCOUNTER — Encounter (HOSPITAL_COMMUNITY): Payer: Self-pay | Admitting: *Deleted

## 2012-05-17 ENCOUNTER — Emergency Department (HOSPITAL_COMMUNITY)
Admission: EM | Admit: 2012-05-17 | Discharge: 2012-05-17 | Disposition: A | Discharge status: Home / Self Care | Payer: Medicaid Other | Attending: Emergency Medicine | Admitting: Emergency Medicine

## 2012-05-17 DIAGNOSIS — F17200 Nicotine dependence, unspecified, uncomplicated: Secondary | ICD-10-CM | POA: Insufficient documentation

## 2012-05-17 DIAGNOSIS — G8929 Other chronic pain: Secondary | ICD-10-CM | POA: Insufficient documentation

## 2012-05-17 DIAGNOSIS — S93401A Sprain of unspecified ligament of right ankle, initial encounter: Secondary | ICD-10-CM

## 2012-05-17 DIAGNOSIS — X58XXXA Exposure to other specified factors, initial encounter: Secondary | ICD-10-CM | POA: Insufficient documentation

## 2012-05-17 DIAGNOSIS — M5030 Other cervical disc degeneration, unspecified cervical region: Secondary | ICD-10-CM | POA: Insufficient documentation

## 2012-05-17 DIAGNOSIS — M810 Age-related osteoporosis without current pathological fracture: Secondary | ICD-10-CM | POA: Insufficient documentation

## 2012-05-17 DIAGNOSIS — S93409A Sprain of unspecified ligament of unspecified ankle, initial encounter: Secondary | ICD-10-CM | POA: Insufficient documentation

## 2012-05-17 DIAGNOSIS — F411 Generalized anxiety disorder: Secondary | ICD-10-CM | POA: Insufficient documentation

## 2012-05-17 HISTORY — DX: Cervicalgia: M54.2

## 2012-05-17 HISTORY — DX: Other chronic pain: G89.29

## 2012-05-17 HISTORY — DX: Other cervical disc degeneration, unspecified cervical region: M50.30

## 2012-05-17 HISTORY — DX: Age-related osteoporosis without current pathological fracture: M81.0

## 2012-05-17 NOTE — ED Notes (Signed)
Pt with right ankle pain, states that she twisted it 3 weeks ago and was seen here

## 2012-05-17 NOTE — ED Provider Notes (Signed)
Medical screening examination/treatment/procedure(s) were performed by non-physician practitioner and as supervising physician I was immediately available for consultation/collaboration. Devoria Albe, MD, Armando Gang   Ward Givens, MD 05/17/12 [phone]

## 2012-05-17 NOTE — ED Notes (Signed)
Numbness rt foot, Seen here for ankle injury app 3 weeks ago.  Given a splint to wear.  Since then has seen Dr Sherwood Gambler and then sent to Dr Romeo Apple.  Dr Romeo Apple told her to stop wearing the splint and since stopping wearing the splint , pt has had numbness of her foot. No swelling, color good,

## 2012-05-17 NOTE — ED Provider Notes (Signed)
History     CSN: [id number]  Arrival date & time 05/17/12  1119   First MD Initiated Contact with Patient 05/17/12 1201      Chief Complaint  Patient presents with  . Ankle Pain    (Consider location/radiation/quality/duration/timing/severity/associated sxs/prior treatment) HPI Comments: Pt sprained R ankle 3 weeks ago.  Was seen here and fitted for an ASO splint.  She has seen dr. Sherwood Gambler and then dr. Romeo Apple.  Dr. Romeo Apple told her to stop wearing the splint and start walking on it.  She is having increasing pain since doing that and sometimes numbness.  Has not re-injured the ankle.  Patient is a 56 y.o. female presenting with ankle pain. The history is provided by the patient. No language interpreter was used.  Ankle Pain  Associated symptoms include numbness.    Past Medical History  Diagnosis Date  . Back pain   . DJD (degenerative joint disease)   . Depression   . Anxiety   . Gait abnormality   . Chronic neck pain   . DDD (degenerative disc disease), cervical   . Osteoporosis     Past Surgical History  Procedure Date  . Cesarean section   . Tubal ligation   . Abdominal hysterectomy     Family History  Problem Relation Age of Onset  . Heart disease      family history   . Arthritis      family history     History  Substance Use Topics  . Smoking status: Current Everyday Smoker    Types: Cigarettes  . Smokeless tobacco: Not on file  . Alcohol Use: No    OB History    Grav Para Term Preterm Abortions TAB SAB Ect Mult Living                  Review of Systems  Constitutional: Negative for fever and chills.  Musculoskeletal:       Ankle pain  Neurological: Positive for numbness.  All other systems reviewed and are negative.    Allergies  Review of patient's allergies indicates no known allergies.  Home Medications   Current Outpatient Rx  Name Route Sig Dispense Refill  . ALENDRONATE SODIUM 70 MG PO TABS Oral Take 70 mg by mouth every 7  (seven) days. Take with a full glass of water on an empty stomach. Takes on Saturdays    . ALPRAZOLAM 1 MG PO TABS Oral Take 1 mg by mouth 4 (four) times daily as needed. anxiety    . CALCIUM + D3 600-200 MG-UNIT PO TABS Oral Take 2 tablets by mouth daily.    . OXYCODONE-ACETAMINOPHEN 5-325 MG PO TABS Oral Take 1 tablet by mouth every 6 (six) hours as needed. Back pain    . PRAVASTATIN SODIUM 40 MG PO TABS Oral Take 40 mg by mouth at bedtime.    . SERTRALINE HCL 50 MG PO TABS Oral Take 50 mg by mouth daily.    . MELOXICAM 15 MG PO TABS      . NITROFURANTOIN MONOHYD MACRO 100 MG PO CAPS        BP 152/75  Pulse 96  Temp 97.9 F (36.6 C) (Oral)  Resp 20  Ht 5\' 2"  (1.575 m)  Wt 153 lb (69.4 kg)  BMI 27.98 kg/m2  SpO2 98%  Physical Exam  Nursing note and vitals reviewed. Constitutional: She is oriented to person, place, and time. She appears well-developed and well-nourished. No distress.  HENT:  Head:  Normocephalic and atraumatic.  Eyes: EOM are normal.  Neck: Normal range of motion.  Cardiovascular: Normal rate, regular rhythm and normal heart sounds.   Pulmonary/Chest: Effort normal and breath sounds normal.  Abdominal: Soft. She exhibits no distension. There is no tenderness.  Musculoskeletal: She exhibits tenderness.       Right ankle: She exhibits decreased range of motion. She exhibits no swelling, no ecchymosis, no deformity, no laceration and normal pulse. tenderness. Lateral malleolus tenderness found.       Feet:  Neurological: She is alert and oriented to person, place, and time.  Skin: Skin is warm and dry.  Psychiatric: She has a normal mood and affect. Judgment normal.    ED Course  Procedures (including critical care time)  Labs Reviewed - No data to display No results found.   1. Right ankle sprain       MDM  Gradually decrease ASO splint wearing.  Call dr. Romeo Apple about this and about the numbness.        Evalina Field, [place] 05/17/12 [phone]

## 2012-05-18 ENCOUNTER — Telehealth: Payer: Self-pay | Admitting: Orthopedic Surgery

## 2012-05-18 NOTE — Telephone Encounter (Signed)
THE SHOES OR THE BRACE ARE TOO TIGHT LOOSEN THEM

## 2012-05-18 NOTE — Telephone Encounter (Signed)
Nicole Welch says her foot is numb while wearing socks and tennis shoes. The numbness goes away when she removes the tennis shoe and wears the brace. Wants to know if the numbness is normal and should she be wearing the brace more?  Her phone # [phone]

## 2012-05-18 NOTE — Telephone Encounter (Signed)
Nicole Welch says when she wears socks and tennis shoes, her foot gets numb.  The numbness goes away when she takes the tennis shoes off and wears the brace.  Asking if this is normal and should she be wearing the brace more.  This may be a duplicate, but I did it this morning and don't think I routed to you.

## 2012-05-19 NOTE — Telephone Encounter (Signed)
Left message to call office

## 2012-07-19 ENCOUNTER — Other Ambulatory Visit (HOSPITAL_COMMUNITY): Payer: Self-pay | Admitting: Internal Medicine

## 2012-07-19 DIAGNOSIS — Z1231 Encounter for screening mammogram for malignant neoplasm of breast: Secondary | ICD-10-CM

## 2012-08-10 ENCOUNTER — Ambulatory Visit (HOSPITAL_COMMUNITY)
Admission: RE | Admit: 2012-08-10 | Discharge: 2012-08-10 | Disposition: A | Discharge status: Home / Self Care | Payer: Self-pay | Source: Ambulatory Visit | Attending: Internal Medicine | Admitting: Internal Medicine

## 2012-08-10 DIAGNOSIS — Z1231 Encounter for screening mammogram for malignant neoplasm of breast: Secondary | ICD-10-CM

## 2013-08-24 ENCOUNTER — Other Ambulatory Visit (HOSPITAL_COMMUNITY): Payer: Self-pay | Admitting: Internal Medicine

## 2013-08-24 DIAGNOSIS — Z139 Encounter for screening, unspecified: Secondary | ICD-10-CM

## 2013-08-25 ENCOUNTER — Ambulatory Visit (HOSPITAL_COMMUNITY)
Admission: RE | Admit: 2013-08-25 | Discharge: 2013-08-25 | Disposition: A | Discharge status: Home / Self Care | Payer: Medicare Other | Source: Ambulatory Visit | Attending: Internal Medicine | Admitting: Internal Medicine

## 2013-08-25 DIAGNOSIS — Z139 Encounter for screening, unspecified: Secondary | ICD-10-CM

## 2013-08-25 DIAGNOSIS — Z1231 Encounter for screening mammogram for malignant neoplasm of breast: Secondary | ICD-10-CM | POA: Insufficient documentation

## 2013-08-29 ENCOUNTER — Other Ambulatory Visit: Payer: Self-pay | Admitting: Internal Medicine

## 2013-08-29 DIAGNOSIS — R928 Other abnormal and inconclusive findings on diagnostic imaging of breast: Secondary | ICD-10-CM

## 2013-09-21 ENCOUNTER — Other Ambulatory Visit: Payer: Self-pay | Admitting: Internal Medicine

## 2013-09-21 ENCOUNTER — Ambulatory Visit (HOSPITAL_COMMUNITY)
Admission: RE | Admit: 2013-09-21 | Discharge: 2013-09-21 | Disposition: A | Discharge status: Home / Self Care | Payer: Medicare Other | Source: Ambulatory Visit | Attending: Internal Medicine | Admitting: Internal Medicine

## 2013-09-21 DIAGNOSIS — R928 Other abnormal and inconclusive findings on diagnostic imaging of breast: Secondary | ICD-10-CM

## 2013-09-21 DIAGNOSIS — N63 Unspecified lump in unspecified breast: Secondary | ICD-10-CM | POA: Insufficient documentation

## 2013-09-21 DIAGNOSIS — N6320 Unspecified lump in the left breast, unspecified quadrant: Secondary | ICD-10-CM

## 2013-09-28 ENCOUNTER — Other Ambulatory Visit: Payer: Self-pay | Admitting: Internal Medicine

## 2013-09-28 ENCOUNTER — Encounter (HOSPITAL_COMMUNITY): Payer: Medicare Other

## 2013-09-28 ENCOUNTER — Ambulatory Visit
Admission: RE | Admit: 2013-09-28 | Discharge: 2013-09-28 | Disposition: A | Discharge status: Home / Self Care | Payer: Medicare HMO | Source: Ambulatory Visit | Attending: Internal Medicine | Admitting: Internal Medicine

## 2013-09-28 DIAGNOSIS — N6320 Unspecified lump in the left breast, unspecified quadrant: Secondary | ICD-10-CM

## 2014-04-22 ENCOUNTER — Inpatient Hospital Stay (HOSPITAL_COMMUNITY)
Admission: EM | Admit: 2014-04-22 | Discharge: 2014-04-23 | DRG: 203 | Disposition: A | Discharge status: Home / Self Care | Payer: Medicare HMO | Attending: Internal Medicine | Admitting: Internal Medicine

## 2014-04-22 ENCOUNTER — Emergency Department (HOSPITAL_COMMUNITY): Payer: Medicare HMO

## 2014-04-22 ENCOUNTER — Encounter (HOSPITAL_COMMUNITY): Payer: Self-pay | Admitting: Emergency Medicine

## 2014-04-22 DIAGNOSIS — J209 Acute bronchitis, unspecified: Principal | ICD-10-CM

## 2014-04-22 DIAGNOSIS — M549 Dorsalgia, unspecified: Secondary | ICD-10-CM | POA: Diagnosis present

## 2014-04-22 DIAGNOSIS — Z87891 Personal history of nicotine dependence: Secondary | ICD-10-CM

## 2014-04-22 DIAGNOSIS — E785 Hyperlipidemia, unspecified: Secondary | ICD-10-CM | POA: Diagnosis present

## 2014-04-22 DIAGNOSIS — M810 Age-related osteoporosis without current pathological fracture: Secondary | ICD-10-CM | POA: Diagnosis present

## 2014-04-22 DIAGNOSIS — M1990 Unspecified osteoarthritis, unspecified site: Secondary | ICD-10-CM | POA: Diagnosis present

## 2014-04-22 DIAGNOSIS — G8929 Other chronic pain: Secondary | ICD-10-CM | POA: Diagnosis present

## 2014-04-22 DIAGNOSIS — N390 Urinary tract infection, site not specified: Secondary | ICD-10-CM | POA: Diagnosis present

## 2014-04-22 DIAGNOSIS — D72829 Elevated white blood cell count, unspecified: Secondary | ICD-10-CM | POA: Diagnosis present

## 2014-04-22 DIAGNOSIS — F411 Generalized anxiety disorder: Secondary | ICD-10-CM | POA: Diagnosis present

## 2014-04-22 DIAGNOSIS — R112 Nausea with vomiting, unspecified: Secondary | ICD-10-CM | POA: Diagnosis present

## 2014-04-22 LAB — URINALYSIS, ROUTINE W REFLEX MICROSCOPIC
BILIRUBIN URINE: NEGATIVE
Glucose, UA: NEGATIVE mg/dL
Ketones, ur: NEGATIVE mg/dL
Leukocytes, UA: NEGATIVE
Nitrite: NEGATIVE
PROTEIN: NEGATIVE mg/dL
Specific Gravity, Urine: <1.005 — ABNORMAL LOW (ref 1.005–1.030)
UROBILINOGEN UA: 0.2 mg/dL (ref 0.0–1.0)
pH: 5.5 (ref 5.0–8.0)

## 2014-04-22 LAB — HEPATIC FUNCTION PANEL
ALBUMIN: 3.7 g/dL (ref 3.5–5.2)
ALK PHOS: 109 U/L (ref 39–117)
ALT: 43 U/L — AB (ref 0–35)
AST: 32 U/L (ref 0–37)
BILIRUBIN TOTAL: 0.3 mg/dL (ref 0.3–1.2)
Bilirubin, Direct: <0.2 mg/dL (ref 0.0–0.3)
Total Protein: 7.6 g/dL (ref 6.0–8.3)

## 2014-04-22 LAB — BASIC METABOLIC PANEL
ANION GAP: 13 (ref 5–15)
BUN: 12 mg/dL (ref 6–23)
CO2: 26 mEq/L (ref 19–32)
CREATININE: 0.66 mg/dL (ref 0.50–1.10)
Calcium: 8.9 mg/dL (ref 8.4–10.5)
Chloride: 99 mEq/L (ref 96–112)
GFR calc non Af Amer: >90 mL/min (ref >90)
Glucose, Bld: 123 mg/dL — ABNORMAL HIGH (ref 70–99)
Potassium: 4.3 mEq/L (ref 3.7–5.3)
Sodium: 138 mEq/L (ref 137–147)

## 2014-04-22 LAB — CBC WITH DIFFERENTIAL/PLATELET
BASOS ABS: 0 10*3/uL (ref 0.0–0.1)
BASOS PCT: 0 % (ref 0–1)
Eosinophils Absolute: 0 10*3/uL (ref 0.0–0.7)
Eosinophils Relative: 0 % (ref 0–5)
HCT: 47.7 % — ABNORMAL HIGH (ref 36.0–46.0)
HEMOGLOBIN: 16.3 g/dL — AB (ref 12.0–15.0)
Lymphocytes Relative: 3 % — ABNORMAL LOW (ref 12–46)
Lymphs Abs: 0.6 10*3/uL — ABNORMAL LOW (ref 0.7–4.0)
MCH: 32.5 pg (ref 26.0–34.0)
MCHC: 34.2 g/dL (ref 30.0–36.0)
MCV: 95.2 fL (ref 78.0–100.0)
Monocytes Absolute: 0.7 10*3/uL (ref 0.1–1.0)
Monocytes Relative: 4 % (ref 3–12)
Neutro Abs: 17.7 10*3/uL — ABNORMAL HIGH (ref 1.7–7.7)
Neutrophils Relative %: 93 % — ABNORMAL HIGH (ref 43–77)
Platelets: 265 10*3/uL (ref 150–400)
RBC: 5.01 MIL/uL (ref 3.87–5.11)
RDW: 13 % (ref 11.5–15.5)
WBC: 19 10*3/uL — ABNORMAL HIGH (ref 4.0–10.5)

## 2014-04-22 LAB — URINE MICROSCOPIC-ADD ON

## 2014-04-22 MED ORDER — SODIUM CHLORIDE 0.9 % IJ SOLN: 3.0000 mL | INTRAMUSCULAR | Status: DC | PRN

## 2014-04-22 MED ORDER — SODIUM CHLORIDE 0.9 % IV SOLN
1000.0000 mL | Freq: Once | INTRAVENOUS | Status: AC
  Administered 2014-04-22: 1000 mL via INTRAVENOUS

## 2014-04-22 MED ORDER — ONDANSETRON HCL 4 MG/2ML IJ SOLN: 4.0000 mg | Freq: Four times a day (QID) | INTRAMUSCULAR | Status: DC | PRN

## 2014-04-22 MED ORDER — ONDANSETRON HCL 4 MG PO TABS: 4.0000 mg | ORAL_TABLET | Freq: Four times a day (QID) | ORAL | Status: DC | PRN

## 2014-04-22 MED ORDER — OXYCODONE-ACETAMINOPHEN 5-325 MG PO TABS
1.0000 | ORAL_TABLET | Freq: Four times a day (QID) | ORAL | Status: DC | PRN
  Administered 2014-04-22 – 2014-04-23 (×2): 1 via ORAL
  Filled 2014-04-22 (×2): qty 1

## 2014-04-22 MED ORDER — SODIUM CHLORIDE 0.9 % IV SOLN: 250.0000 mL | INTRAVENOUS | Status: DC | PRN

## 2014-04-22 MED ORDER — ACETAMINOPHEN 325 MG PO TABS
650.0000 mg | ORAL_TABLET | Freq: Once | ORAL | Status: AC
  Administered 2014-04-22: 650 mg via ORAL
  Filled 2014-04-22: qty 2

## 2014-04-22 MED ORDER — ALPRAZOLAM 1 MG PO TABS
1.0000 mg | ORAL_TABLET | Freq: Three times a day (TID) | ORAL | Status: DC | PRN
  Administered 2014-04-22 – 2014-04-23 (×2): 1 mg via ORAL
  Filled 2014-04-22 (×2): qty 1

## 2014-04-22 MED ORDER — DEXTROSE 5 % IV SOLN
1.0000 g | Freq: Once | INTRAVENOUS | Status: AC
  Administered 2014-04-22: 1 g via INTRAVENOUS

## 2014-04-22 MED ORDER — SODIUM CHLORIDE 0.9 % IV SOLN
INTRAVENOUS | Status: DC
  Administered 2014-04-22: 23:00:00 via INTRAVENOUS

## 2014-04-22 MED ORDER — MELOXICAM 7.5 MG PO TABS
7.5000 mg | ORAL_TABLET | Freq: Every day | ORAL | Status: DC
  Administered 2014-04-23: 7.5 mg via ORAL
  Filled 2014-04-22 (×2): qty 1

## 2014-04-22 MED ORDER — SIMVASTATIN 20 MG PO TABS: 20.0000 mg | ORAL_TABLET | Freq: Every day | ORAL | Status: DC

## 2014-04-22 MED ORDER — ALBUTEROL SULFATE (2.5 MG/3ML) 0.083% IN NEBU: 2.5000 mg | INHALATION_SOLUTION | RESPIRATORY_TRACT | Status: DC | PRN

## 2014-04-22 MED ORDER — LEVOFLOXACIN IN D5W 500 MG/100ML IV SOLN
INTRAVENOUS | Status: AC
  Filled 2014-04-22: qty 100

## 2014-04-22 MED ORDER — ACETAMINOPHEN 325 MG PO TABS
ORAL_TABLET | ORAL | Status: AC
  Administered 2014-04-22: 16:00:00 650 mg via ORAL
  Filled 2014-04-22: qty 1

## 2014-04-22 MED ORDER — SODIUM CHLORIDE 0.9 % IJ SOLN
3.0000 mL | Freq: Two times a day (BID) | INTRAMUSCULAR | Status: DC
  Administered 2014-04-23: 3 mL via INTRAVENOUS

## 2014-04-22 MED ORDER — LEVOFLOXACIN IN D5W 500 MG/100ML IV SOLN
500.0000 mg | INTRAVENOUS | Status: DC
  Administered 2014-04-22: 500 mg via INTRAVENOUS
  Filled 2014-04-22 (×2): qty 100

## 2014-04-22 MED ORDER — SERTRALINE HCL 50 MG PO TABS
50.0000 mg | ORAL_TABLET | Freq: Every day | ORAL | Status: DC
  Filled 2014-04-22: qty 1

## 2014-04-22 MED ORDER — DEXTROSE 5 % IV SOLN
INTRAVENOUS | Status: AC
  Administered 2014-04-22: 17:00:00 1 g via INTRAVENOUS
  Filled 2014-04-22: qty 10

## 2014-04-22 MED ORDER — ONDANSETRON 8 MG PO TBDP
8.0000 mg | ORAL_TABLET | Freq: Once | ORAL | Status: AC
  Administered 2014-04-22: 8 mg via ORAL
  Filled 2014-04-22: qty 1

## 2014-04-22 NOTE — ED Provider Notes (Signed)
CSN: [id number]     Arrival date & time 04/22/14  1512 History   First MD Initiated Contact with Patient 04/22/14 1700     Chief Complaint  Patient presents with  . Emesis     (Consider location/radiation/quality/duration/timing/severity/associated sxs/prior Treatment) Patient is a 58 y.o. female presenting with vomiting. The history is provided by the patient (pt had some vomiting today and is on antibiotics for dysuria).  Emesis Severity:  Moderate Timing:  Intermittent Quality:  Malodorous material Able to tolerate:  Liquids Progression:  Unchanged Associated symptoms: no abdominal pain, no diarrhea and no headaches     Past Medical History  Diagnosis Date  . Back pain   . DJD (degenerative joint disease)   . Depression   . Anxiety   . Gait abnormality   . Chronic neck pain   . DDD (degenerative disc disease), cervical   . Osteoporosis    Past Surgical History  Procedure Laterality Date  . Cesarean section    . Tubal ligation    . Abdominal hysterectomy     Family History  Problem Relation Age of Onset  . Heart disease      family history   . Arthritis      family history    History  Substance Use Topics  . Smoking status: Current Every Day Smoker    Types: Cigarettes  . Smokeless tobacco: Not on file  . Alcohol Use: No   OB History   Grav Para Term Preterm Abortions TAB SAB Ect Mult Living                 Review of Systems  Constitutional: Negative for appetite change and fatigue.  HENT: Negative for congestion, ear discharge and sinus pressure.   Eyes: Negative for discharge.  Respiratory: Negative for cough.   Cardiovascular: Negative for chest pain.  Gastrointestinal: Positive for vomiting. Negative for abdominal pain and diarrhea.  Genitourinary: Negative for frequency and hematuria.  Musculoskeletal: Negative for back pain.  Skin: Negative for rash.  Neurological: Negative for seizures and headaches.  Psychiatric/Behavioral: Negative for  hallucinations.      Allergies  Review of patient's allergies indicates no known allergies.  Home Medications   Prior to Admission medications   Medication Sig Start Date End Date Taking? Authorizing Provider  alendronate (FOSAMAX) 70 MG tablet Take 70 mg by mouth every 7 (seven) days. Take with a full glass of water on an empty stomach. Takes on Saturdays   Yes Historical Provider, MD  ALPRAZolam Prudy Feeler(XANAX) 1 MG tablet Take 1 mg by mouth 4 (four) times daily as needed. anxiety 12/13/10  Yes Historical Provider, MD  Calcium Carb-Cholecalciferol (CALCIUM + D3) 600-200 MG-UNIT TABS Take 2 tablets by mouth daily.   Yes Historical Provider, MD  meloxicam (MOBIC) 15 MG tablet Take 7.5 mg by mouth daily.  09/28/10  Yes Historical Provider, MD  nitrofurantoin, macrocrystal-monohydrate, (MACROBID) 100 MG capsule Take 100 mg by mouth 2 (two) times daily. For 7 days 04/21/14 09/18/10  Yes Historical Provider, MD  oxyCODONE-acetaminophen (PERCOCET) 5-325 MG per tablet Take 1 tablet by mouth every 6 (six) hours as needed. Back pain 10/21/10  Yes Historical Provider, MD  pravastatin (PRAVACHOL) 40 MG tablet Take 40 mg by mouth at bedtime.   Yes Historical Provider, MD  sertraline (ZOLOFT) 50 MG tablet Take 50 mg by mouth daily.   Yes Historical Provider, MD   BP 104/58  Pulse 103  Temp(Src) 99.7 F (37.6 C) (Oral)  Resp 16  Ht 5\' 2"  (1.575 m)  Wt 165 lb (74.844 kg)  BMI 30.17 kg/m2  SpO2 92% Physical Exam  Constitutional: She is oriented to person, place, and time. She appears well-developed.  HENT:  Head: Normocephalic.  Eyes: Conjunctivae and EOM are normal. No scleral icterus.  Neck: Neck supple. No thyromegaly present.  Cardiovascular: Normal rate and regular rhythm.  Exam reveals no gallop and no friction rub.   No murmur heard. Pulmonary/Chest: No stridor. She has no wheezes. She has no rales. She exhibits no tenderness.  Abdominal: She exhibits no distension. There is no tenderness. There  is no rebound.  Musculoskeletal: Normal range of motion. She exhibits no edema.  Lymphadenopathy:    She has no cervical adenopathy.  Neurological: She is oriented to person, place, and time. She exhibits normal muscle tone. Coordination normal.  Skin: No rash noted. No erythema.  Psychiatric: She has a normal mood and affect. Her behavior is normal.    ED Course  Procedures (including critical care time) Labs Review Labs Reviewed  CBC WITH DIFFERENTIAL - Abnormal; Notable for the following:    WBC 19.0 (*)    Hemoglobin 16.3 (*)    HCT 47.7 (*)    Neutrophils Relative % 93 (*)    Neutro Abs 17.7 (*)    Lymphocytes Relative 3 (*)    Lymphs Abs 0.6 (*)    All other components within normal limits  BASIC METABOLIC PANEL - Abnormal; Notable for the following:    Glucose, Bld 123 (*)    All other components within normal limits  URINALYSIS, ROUTINE W REFLEX MICROSCOPIC - Abnormal; Notable for the following:    Specific Gravity, Urine <1.005 (*)    Hgb urine dipstick TRACE (*)    All other components within normal limits  URINE MICROSCOPIC-ADD ON - Abnormal; Notable for the following:    Squamous Epithelial / LPF FEW (*)    All other components within normal limits  URINE CULTURE  HEPATIC FUNCTION PANEL    Imaging Review Dg Chest 2 View  04/22/2014   CLINICAL DATA:  Weakness  EXAM: CHEST  2 VIEW  COMPARISON:  None.  FINDINGS: The heart size and mediastinal contours are within normal limits. Both lungs are clear. The visualized skeletal structures are unremarkable.  IMPRESSION: No active cardiopulmonary disease.   Electronically Signed   By: Alcide Clever M.D.   On: 04/22/2014 20:11     EKG Interpretation None      MDM   Final diagnoses:  Leukocytosis    Will admit for leukocytosis and tx possibly for uti  The chart was scribed for me under my direct supervision.  I personally performed the history, physical, and medical decision making and all procedures in the evaluation  of this patient.Benny Lennert, MD 04/22/14 2059

## 2014-04-22 NOTE — H&P (Addendum)
PCP:   Cassell SmilesFUSCO,LAWRENCE J., MD   Chief Complaint:  Nausea and vomiting  HPI: 58 year old female who   has a past medical history of Back pain; DJD (degenerative joint disease); Depression; Anxiety; Gait abnormality; Chronic neck pain; DDD (degenerative disc disease), cervical; and Osteoporosis. Today came to the ED with two-day history of nausea vomiting. As per patient she developed bladder infection, last week, though she never got tested for it. Her primary care physician called Cipro on 7/28, but she felt dizzy so he changed to Macrobid on 7/31. Patient says that she took it yesterday today, but this morning she started having fever and chills with nausea and vomiting. She denies dysuria at this time. UA done in the ED was negative for infection but patient has leukocytosis with white count of 19,000. She also has been coughing for the past few days, and blames it on air-conditioner. She denies shortness of breath. Denies coughing up any phlegm. Denies diarrhea. She denies abdominal pain no chest pain.   Allergies:  No Known Allergies    Past Medical History  Diagnosis Date  . Back pain   . DJD (degenerative joint disease)   . Depression   . Anxiety   . Gait abnormality   . Chronic neck pain   . DDD (degenerative disc disease), cervical   . Osteoporosis     Past Surgical History  Procedure Laterality Date  . Cesarean section    . Tubal ligation    . Abdominal hysterectomy      Prior to Admission medications   Medication Sig Start Date End Date Taking? Authorizing Provider  alendronate (FOSAMAX) 70 MG tablet Take 70 mg by mouth every 7 (seven) days. Take with a full glass of water on an empty stomach. Takes on Saturdays   Yes Historical Provider, MD  ALPRAZolam Prudy Feeler(XANAX) 1 MG tablet Take 1 mg by mouth 4 (four) times daily as needed. anxiety 12/13/10  Yes Historical Provider, MD  Calcium Carb-Cholecalciferol (CALCIUM + D3) 600-200 MG-UNIT TABS Take 2 tablets by mouth daily.    Yes Historical Provider, MD  meloxicam (MOBIC) 15 MG tablet Take 7.5 mg by mouth daily.  09/28/10  Yes Historical Provider, MD  nitrofurantoin, macrocrystal-monohydrate, (MACROBID) 100 MG capsule Take 100 mg by mouth 2 (two) times daily. For 7 days 04/21/14 09/18/10  Yes Historical Provider, MD  oxyCODONE-acetaminophen (PERCOCET) 5-325 MG per tablet Take 1 tablet by mouth every 6 (six) hours as needed. Back pain 10/21/10  Yes Historical Provider, MD  pravastatin (PRAVACHOL) 40 MG tablet Take 40 mg by mouth at bedtime.   Yes Historical Provider, MD  sertraline (ZOLOFT) 50 MG tablet Take 50 mg by mouth daily.   Yes Historical Provider, MD    Social History:  reports that she quit smoking about 7 months ago. Her smoking use included Cigarettes. She smoked 0.00 packs per day. She does not have any smokeless tobacco history on file. She reports that she does not drink alcohol or use illicit drugs.  Family History  Problem Relation Age of Onset  . Heart disease      family history   . Arthritis      family history      All the positives are listed in BOLD  Review of Systems:  HEENT: Headache, blurred vision, runny nose, sore throat Neck: Hypothyroidism, hyperthyroidism,,lymphadenopathy Chest : Shortness of breath, history of COPD, Asthma Heart : Chest pain, history of coronary arterey disease GI:  Nausea, vomiting, diarrhea, constipation, GERD GU:  Dysuria, urgency, frequency of urination, hematuria Neuro: Stroke, seizures, syncope Psych: Depression, anxiety, hallucinations   Physical Exam: Blood pressure 113/53, pulse 94, temperature 98.5 F (36.9 C), temperature source Oral, resp. rate 18, height 5\' 2"  (1.575 m), weight 81.647 kg (180 lb), SpO2 95.00%. Constitutional:   Patient is a well-developed and well-nourished *female in no acute distress and cooperative with exam. Head: Normocephalic and atraumatic Mouth: Mucus membranes moist Eyes: PERRL, EOMI, conjunctivae normal Neck: Supple,  No Thyromegaly Cardiovascular: RRR, S1 normal, S2 normal Pulmonary/Chest: CTAB, no wheezes, rales, or rhonchi Abdominal: Soft. Non-tender, non-distended, bowel sounds are normal, no masses, organomegaly, or guarding present.  Neurological: A&O x3, Strenght is normal and symmetric bilaterally, cranial nerve II-XII are grossly intact, no focal motor deficit, sensory intact to light touch bilaterally.  Extremities : No Cyanosis, Clubbing or Edema  Labs on Admission:  Basic Metabolic Panel:  Recent Labs Lab 04/22/14 1622  NA 138  K 4.3  CL 99  CO2 26  GLUCOSE 123*  BUN 12  CREATININE 0.66  CALCIUM 8.9   Liver Function Tests:  Recent Labs Lab 04/22/14 2044  AST 32  ALT 43*  ALKPHOS 109  BILITOT 0.3  PROT 7.6  ALBUMIN 3.7   No results found for this basename: LIPASE, AMYLASE,  in the last 168 hours No results found for this basename: AMMONIA,  in the last 168 hours CBC:  Recent Labs Lab 04/22/14 1622  WBC 19.0*  NEUTROABS 17.7*  HGB 16.3*  HCT 47.7*  MCV 95.2  PLT 265    Radiological Exams on Admission: Dg Chest 2 View  04/22/2014   CLINICAL DATA:  Weakness  EXAM: CHEST  2 VIEW  COMPARISON:  None.  FINDINGS: The heart size and mediastinal contours are within normal limits. Both lungs are clear. The visualized skeletal structures are unremarkable.  IMPRESSION: No active cardiopulmonary disease.   Electronically Signed   By: Alcide Clever M.D.   On: 04/22/2014 20:11       Assessment/Plan Principal Problem:   Leukocytosis Active Problems:   Acute bronchitis   Other and unspecified hyperlipidemia  Leukocytosis ? Cause, patient had symptoms of UTI but UA is clear today. Urine culture has been sent. She does not have any symptoms of dysuria or urgency or frequency of urination at this time. Patient does have cough, and appears congested. Was started patient on Levaquin which should cover both bronchitis as well as UTI. We'll follow CBC in a.m.  Nausea and  vomiting Started after she took Macrobid, resolved at this time. LFTs are within normal range. Will give Zofran when necessary. Continue to monitor.  Hyperlipidemia Continue Pravachol.  Chronic back pain Continue Percocet when necessary  Anxiety Continue when necessary Xanax and Zoloft 50 mg by mouth daily  DVT prophylaxis Lovenox  Code status: patient is full code  Family discussion: no family at bedside   Time Spent on Admission: 60 min  Jari Dipasquale S [facility] Pager: [phone] 04/22/2014, 10:20 PM  If 7PM-7AM, please contact night-coverage  [email or web address]  Password [email or web address]

## 2014-04-22 NOTE — ED Notes (Signed)
Pt states she was dx with kidney infection on 7/28 and began taking cipro on 7/29 and nitrofurantoin on 7/31. Pt c/o feeling worse with n/v today.

## 2014-04-23 DIAGNOSIS — N390 Urinary tract infection, site not specified: Secondary | ICD-10-CM | POA: Diagnosis present

## 2014-04-23 LAB — COMPREHENSIVE METABOLIC PANEL
ALK PHOS: 86 U/L (ref 39–117)
ALT: 41 U/L — ABNORMAL HIGH (ref 0–35)
AST: 34 U/L (ref 0–37)
Albumin: 2.7 g/dL — ABNORMAL LOW (ref 3.5–5.2)
Anion gap: 8 (ref 5–15)
BUN: 9 mg/dL (ref 6–23)
CALCIUM: 8.3 mg/dL — AB (ref 8.4–10.5)
CO2: 27 meq/L (ref 19–32)
Chloride: 103 mEq/L (ref 96–112)
Creatinine, Ser: 0.66 mg/dL (ref 0.50–1.10)
Glucose, Bld: 102 mg/dL — ABNORMAL HIGH (ref 70–99)
POTASSIUM: 4.1 meq/L (ref 3.7–5.3)
SODIUM: 138 meq/L (ref 137–147)
TOTAL PROTEIN: 6.2 g/dL (ref 6.0–8.3)
Total Bilirubin: 0.3 mg/dL (ref 0.3–1.2)

## 2014-04-23 LAB — CBC
HCT: 43 % (ref 36.0–46.0)
Hemoglobin: 14.3 g/dL (ref 12.0–15.0)
MCH: 31.6 pg (ref 26.0–34.0)
MCHC: 33.3 g/dL (ref 30.0–36.0)
MCV: 95.1 fL (ref 78.0–100.0)
PLATELETS: 247 10*3/uL (ref 150–400)
RBC: 4.52 MIL/uL (ref 3.87–5.11)
RDW: 13.2 % (ref 11.5–15.5)
WBC: 11.3 10*3/uL — ABNORMAL HIGH (ref 4.0–10.5)

## 2014-04-23 MED ORDER — LEVOFLOXACIN 500 MG PO TABS
500.0000 mg | ORAL_TABLET | Freq: Every day | ORAL | Status: DC
  Administered 2014-04-23: 500 mg via ORAL
  Filled 2014-04-23: qty 1

## 2014-04-23 MED ORDER — LEVOFLOXACIN 500 MG PO TABS: 500.0000 mg | ORAL_TABLET | Freq: Every day | ORAL | Status: DC

## 2014-04-23 MED ORDER — ALBUTEROL SULFATE HFA 108 (90 BASE) MCG/ACT IN AERS: 2.0000 | INHALATION_SPRAY | Freq: Four times a day (QID) | RESPIRATORY_TRACT | Status: DC | PRN

## 2014-04-23 NOTE — Progress Notes (Signed)
Utilization review Completed Haily Caley RN BSN   

## 2014-04-23 NOTE — Discharge Summary (Signed)
[facility]  Physician Discharge Summary   Patient ID: Nicole Welch MRN: [id number] DOB/AGE: 03/15/1956 58 y.o.  Admit date: 04/22/2014 Discharge date: 04/23/2014  PCP: Cassell Smiles., MD  DISCHARGE DIAGNOSES:  Principal Problem:   Leukocytosis Active Problems:   Acute bronchitis   Other and unspecified hyperlipidemia   UTI (urinary tract infection)   RECOMMENDATIONS FOR OUTPATIENT FOLLOW UP: 1. Needs close follow up with PCP  DISCHARGE CONDITION: fair  Diet recommendation: Regular  Filed Weights   04/22/14 1521 04/22/14 2204  Weight: 74.844 kg (165 lb) 81.647 kg (180 lb)    INITIAL HISTORY: 58 yo female presented with nausea and vomiting. She was recently diagnosed with a UTI by her primary care physician. She was initially started on ciprofloxacin on July 28, but she felt dizzy, so, it was changed over to [facility] on July 31. She subsequently became nauseous and started having vomiting. She also has been having a cough.  Consultations:  None  Procedures:  None  HOSPITAL COURSE:   Leukocytosis  She was have a WBC of 19,000, when she had was admitted to the hospital. There was no clear infectious source. She was recently being treated for a UTI. UA here was were unremarkable. She also was complaining of cough and so, she underwent a chest x-ray, which was also unremarkable. WBC has improved to 11,000 today. Etiology remains unclear. Urine culture is pending. Blood cultures were not drawn. She hasn't had any further fever. Since she is feeling better this morning she is requesting to be discharged.  Nausea and vomiting  This started after she took Macrobid. Could be medication related. Symptoms have resolved. Her diet will be advanced. If she's able to tolerate her meals she could potentially be discharged. Abdomen is benign. LFTs are normal.  Recently diagnosed UTI. She has not tolerated Cipro or Macrobid. She was given intravenous Levaquin, which  she seems to have tolerated. We will change this over to oral, and give it later today to see, if she tolerates.   Cough with possible mild bronchitis. Appears to be improved. Chest x-ray did not show any infiltrate. She'll be prescribed albuterol inhaler as needed. Continue with antibiotics.  Hyperlipidemia  Continue Pravachol.   Chronic back pain  Continue Percocet as before  Anxiety  Continue Xanax and Zoloft 50 mg by mouth daily  Patient feels better and requested that she be discharged. She is apparently going out of town tomorrow. We will advance her diet. We will ambulate her. If she tolerates diet and tolerates her oral antibiotic later today she could potentially be discharged. I have told her to take frequent breaks during her road trip to the beach, which will last about 3-4 hours. This was explained to her daughter, as well.   PERTINENT LABS:  The results of significant diagnostics from this hospitalization (including imaging, microbiology, ancillary and laboratory) are listed below for reference.     Labs: Basic Metabolic Panel:  Recent Labs Lab 04/22/14 1622 04/23/14 0608  NA 138 138  K 4.3 4.1  CL 99 103  CO2 26 27  GLUCOSE 123* 102*  BUN 12 9  CREATININE 0.66 0.66  CALCIUM 8.9 8.3*   Liver Function Tests:  Recent Labs Lab 04/22/14 2044 04/23/14 0608  AST 32 34  ALT 43* 41*  ALKPHOS 109 86  BILITOT 0.3 0.3  PROT 7.6 6.2  ALBUMIN 3.7 2.7*   CBC:  Recent Labs Lab 04/22/14 1622 04/23/14 0608  WBC 19.0* 11.3*  NEUTROABS 17.7*  --  HGB 16.3* 14.3  HCT 47.7* 43.0  MCV 95.2 95.1  PLT 265 247    IMAGING STUDIES Dg Chest 2 View  04/22/2014   CLINICAL DATA:  Weakness  EXAM: CHEST  2 VIEW  COMPARISON:  None.  FINDINGS: The heart size and mediastinal contours are within normal limits. Both lungs are clear. The visualized skeletal structures are unremarkable.  IMPRESSION: No active cardiopulmonary disease.   Electronically Signed   By: Alcide CleverMark  Lukens  M.D.   On: 04/22/2014 20:11    DISCHARGE EXAMINATION: Filed Vitals:   04/22/14 2126 04/22/14 2204 04/23/14 0617 04/23/14 1022  BP: 117/59 113/53 97/40 110/60  Pulse: 96 94 85 89  Temp:  98.5 F (36.9 C) 99 F (37.2 C)   TempSrc:  Oral Oral   Resp: 18 18 20 20   Height:  5\' 2"  (1.575 m)    Weight:  81.647 kg (180 lb)    SpO2: 95% 95% 93% 96%   General appearance: alert, cooperative, appears stated age, no distress and moderately obese Resp: Diminished air entry at the bases without any wheezing Cardio: regular rate and rhythm, S1, S2 normal, no murmur, click, rub or gallop GI: soft, non-tender; bowel sounds normal; no masses,  no organomegaly Neurologic: Alert and oriented x3. No focal neurological deficits are noted  DISPOSITION: Home   ALLERGIES: No Known Allergies  Current Discharge Medication List    START taking these medications   Details  albuterol (PROVENTIL HFA;VENTOLIN HFA) 108 (90 BASE) MCG/ACT inhaler Inhale 2 puffs into the lungs every 6 (six) hours as needed for wheezing or shortness of breath. Qty: 1 Inhaler, Refills: 2    levofloxacin (LEVAQUIN) 500 MG tablet Take 1 tablet (500 mg total) by mouth daily. For 5 more days starting 04/24/14 Qty: 5 tablet, Refills: 0      CONTINUE these medications which have NOT CHANGED   Details  alendronate (FOSAMAX) 70 MG tablet Take 70 mg by mouth every 7 (seven) days. Take with a full glass of water on an empty stomach. Takes on Saturdays    ALPRAZolam (XANAX) 1 MG tablet Take 1 mg by mouth 4 (four) times daily as needed. anxiety    Calcium Carb-Cholecalciferol (CALCIUM + D3) 600-200 MG-UNIT TABS Take 2 tablets by mouth daily.    meloxicam (MOBIC) 15 MG tablet Take 7.5 mg by mouth daily.     oxyCODONE-acetaminophen (PERCOCET) 5-325 MG per tablet Take 1 tablet by mouth every 6 (six) hours as needed. Back pain    pravastatin (PRAVACHOL) 40 MG tablet Take 40 mg by mouth at bedtime.    sertraline (ZOLOFT) 50 MG tablet  Take 50 mg by mouth daily.      STOP taking these medications     nitrofurantoin, macrocrystal-monohydrate, (MACROBID) 100 MG capsule        Follow-up Information   Follow up with Cassell SmilesFUSCO,LAWRENCE J., MD. Schedule an appointment as soon as possible for a visit in 1 week. (post hospitalization follow up)    Specialty:  Internal Medicine   Contact information:   Kem Parkinson[address] [place] [facility] [id number] [phone]       TOTAL DISCHARGE TIME: 35 mins  [facility] LPKRISHNAN,Raja Caputi  [facility] Pager [phone]  04/23/2014, 2:37 PM  Disclaimer: This note was dictated with voice recognition software. Similar sounding words can inadvertently be transcribed and may not be corrected upon review.

## 2014-04-23 NOTE — Discharge Instructions (Signed)

## 2014-04-23 NOTE — Progress Notes (Signed)
Late entry: pt d/c this afternoon. Pt verbalizes understanding of d/c instructions, medications, when to call md and follow up appts. All questions answered. IV d/c, site bled, but was controlled quickly at time of d/c. Pt received prescriptions, and had nausea med called in to pharmacy. D/c via wheelchair accompanied by family and NT. Sheryn BisonGordon, Benjamin Merrihew Warner

## 2014-04-24 LAB — URINE CULTURE
COLONY COUNT: NO GROWTH
Culture: NO GROWTH
SPECIAL REQUESTS: NORMAL

## 2014-11-22 DIAGNOSIS — Z6830 Body mass index (BMI) 30.0-30.9, adult: Secondary | ICD-10-CM | POA: Diagnosis not present

## 2014-11-22 DIAGNOSIS — G894 Chronic pain syndrome: Secondary | ICD-10-CM | POA: Diagnosis not present

## 2014-11-22 DIAGNOSIS — M810 Age-related osteoporosis without current pathological fracture: Secondary | ICD-10-CM | POA: Diagnosis not present

## 2014-11-22 DIAGNOSIS — L309 Dermatitis, unspecified: Secondary | ICD-10-CM | POA: Diagnosis not present

## 2015-02-19 DIAGNOSIS — Z6832 Body mass index (BMI) 32.0-32.9, adult: Secondary | ICD-10-CM | POA: Diagnosis not present

## 2015-02-19 DIAGNOSIS — G894 Chronic pain syndrome: Secondary | ICD-10-CM | POA: Diagnosis not present

## 2015-02-19 DIAGNOSIS — E6609 Other obesity due to excess calories: Secondary | ICD-10-CM | POA: Diagnosis not present

## 2015-03-23 DIAGNOSIS — J069 Acute upper respiratory infection, unspecified: Secondary | ICD-10-CM | POA: Diagnosis not present

## 2015-03-23 DIAGNOSIS — Z1389 Encounter for screening for other disorder: Secondary | ICD-10-CM | POA: Diagnosis not present

## 2015-03-23 DIAGNOSIS — E6609 Other obesity due to excess calories: Secondary | ICD-10-CM | POA: Diagnosis not present

## 2015-03-23 DIAGNOSIS — Z6831 Body mass index (BMI) 31.0-31.9, adult: Secondary | ICD-10-CM | POA: Diagnosis not present

## 2015-03-23 DIAGNOSIS — J0100 Acute maxillary sinusitis, unspecified: Secondary | ICD-10-CM | POA: Diagnosis not present

## 2015-07-12 DIAGNOSIS — M461 Sacroiliitis, not elsewhere classified: Secondary | ICD-10-CM | POA: Diagnosis not present

## 2015-07-12 DIAGNOSIS — M791 Myalgia: Secondary | ICD-10-CM | POA: Diagnosis not present

## 2015-08-03 DIAGNOSIS — F419 Anxiety disorder, unspecified: Secondary | ICD-10-CM | POA: Diagnosis not present

## 2015-08-03 DIAGNOSIS — Z1389 Encounter for screening for other disorder: Secondary | ICD-10-CM | POA: Diagnosis not present

## 2015-08-03 DIAGNOSIS — E6609 Other obesity due to excess calories: Secondary | ICD-10-CM | POA: Diagnosis not present

## 2015-08-03 DIAGNOSIS — Z6831 Body mass index (BMI) 31.0-31.9, adult: Secondary | ICD-10-CM | POA: Diagnosis not present

## 2015-08-03 DIAGNOSIS — M1991 Primary osteoarthritis, unspecified site: Secondary | ICD-10-CM | POA: Diagnosis not present

## 2015-08-03 DIAGNOSIS — M810 Age-related osteoporosis without current pathological fracture: Secondary | ICD-10-CM | POA: Diagnosis not present

## 2015-08-03 DIAGNOSIS — G894 Chronic pain syndrome: Secondary | ICD-10-CM | POA: Diagnosis not present

## 2015-08-03 DIAGNOSIS — E782 Mixed hyperlipidemia: Secondary | ICD-10-CM | POA: Diagnosis not present

## 2015-08-08 ENCOUNTER — Other Ambulatory Visit: Payer: Self-pay

## 2015-08-08 DIAGNOSIS — Z1231 Encounter for screening mammogram for malignant neoplasm of breast: Secondary | ICD-10-CM

## 2015-09-11 ENCOUNTER — Ambulatory Visit
Admission: RE | Admit: 2015-09-11 | Discharge: 2015-09-11 | Disposition: A | Discharge status: Home / Self Care | Payer: Commercial Managed Care - HMO | Source: Ambulatory Visit

## 2015-09-11 DIAGNOSIS — Z1231 Encounter for screening mammogram for malignant neoplasm of breast: Secondary | ICD-10-CM | POA: Diagnosis not present

## 2015-09-23 DIAGNOSIS — I509 Heart failure, unspecified: Secondary | ICD-10-CM

## 2015-09-23 HISTORY — DX: Heart failure, unspecified: I50.9

## 2015-11-06 DIAGNOSIS — G894 Chronic pain syndrome: Secondary | ICD-10-CM | POA: Diagnosis not present

## 2015-11-06 DIAGNOSIS — J069 Acute upper respiratory infection, unspecified: Secondary | ICD-10-CM | POA: Diagnosis not present

## 2015-11-06 DIAGNOSIS — Z6832 Body mass index (BMI) 32.0-32.9, adult: Secondary | ICD-10-CM | POA: Diagnosis not present

## 2015-11-06 DIAGNOSIS — E6609 Other obesity due to excess calories: Secondary | ICD-10-CM | POA: Diagnosis not present

## 2015-11-06 DIAGNOSIS — Z1389 Encounter for screening for other disorder: Secondary | ICD-10-CM | POA: Diagnosis not present

## 2016-02-27 DIAGNOSIS — E782 Mixed hyperlipidemia: Secondary | ICD-10-CM | POA: Diagnosis not present

## 2016-02-27 DIAGNOSIS — F419 Anxiety disorder, unspecified: Secondary | ICD-10-CM | POA: Diagnosis not present

## 2016-02-27 DIAGNOSIS — Z6832 Body mass index (BMI) 32.0-32.9, adult: Secondary | ICD-10-CM | POA: Diagnosis not present

## 2016-02-27 DIAGNOSIS — G894 Chronic pain syndrome: Secondary | ICD-10-CM | POA: Diagnosis not present

## 2016-08-07 ENCOUNTER — Other Ambulatory Visit: Payer: Self-pay | Admitting: Internal Medicine

## 2016-08-07 DIAGNOSIS — Z1231 Encounter for screening mammogram for malignant neoplasm of breast: Secondary | ICD-10-CM

## 2016-09-11 ENCOUNTER — Ambulatory Visit
Admission: RE | Admit: 2016-09-11 | Discharge: 2016-09-11 | Disposition: A | Discharge status: Home / Self Care | Payer: Commercial Managed Care - HMO | Source: Ambulatory Visit | Attending: Internal Medicine | Admitting: Internal Medicine

## 2016-09-11 DIAGNOSIS — Z1231 Encounter for screening mammogram for malignant neoplasm of breast: Secondary | ICD-10-CM | POA: Diagnosis not present

## 2016-09-12 DIAGNOSIS — M810 Age-related osteoporosis without current pathological fracture: Secondary | ICD-10-CM | POA: Diagnosis not present

## 2016-09-12 DIAGNOSIS — M47816 Spondylosis without myelopathy or radiculopathy, lumbar region: Secondary | ICD-10-CM | POA: Diagnosis not present

## 2016-09-12 DIAGNOSIS — F419 Anxiety disorder, unspecified: Secondary | ICD-10-CM | POA: Diagnosis not present

## 2016-09-12 DIAGNOSIS — G894 Chronic pain syndrome: Secondary | ICD-10-CM | POA: Diagnosis not present

## 2016-09-12 DIAGNOSIS — Z6832 Body mass index (BMI) 32.0-32.9, adult: Secondary | ICD-10-CM | POA: Diagnosis not present

## 2016-09-12 DIAGNOSIS — E669 Obesity, unspecified: Secondary | ICD-10-CM | POA: Diagnosis not present

## 2016-09-12 DIAGNOSIS — J329 Chronic sinusitis, unspecified: Secondary | ICD-10-CM | POA: Diagnosis not present

## 2016-09-12 DIAGNOSIS — Z719 Counseling, unspecified: Secondary | ICD-10-CM | POA: Diagnosis not present

## 2016-09-12 DIAGNOSIS — J418 Mixed simple and mucopurulent chronic bronchitis: Secondary | ICD-10-CM | POA: Diagnosis not present

## 2017-03-09 DIAGNOSIS — Z1211 Encounter for screening for malignant neoplasm of colon: Secondary | ICD-10-CM | POA: Diagnosis not present

## 2017-03-18 DIAGNOSIS — M1991 Primary osteoarthritis, unspecified site: Secondary | ICD-10-CM | POA: Diagnosis not present

## 2017-03-18 DIAGNOSIS — Z1389 Encounter for screening for other disorder: Secondary | ICD-10-CM | POA: Diagnosis not present

## 2017-03-18 DIAGNOSIS — G894 Chronic pain syndrome: Secondary | ICD-10-CM | POA: Diagnosis not present

## 2017-03-18 DIAGNOSIS — J329 Chronic sinusitis, unspecified: Secondary | ICD-10-CM | POA: Diagnosis not present

## 2017-03-18 DIAGNOSIS — Z6831 Body mass index (BMI) 31.0-31.9, adult: Secondary | ICD-10-CM | POA: Diagnosis not present

## 2017-07-22 DIAGNOSIS — Z23 Encounter for immunization: Secondary | ICD-10-CM | POA: Diagnosis not present

## 2017-07-25 ENCOUNTER — Ambulatory Visit (HOSPITAL_COMMUNITY)
Admission: EM | Admit: 2017-07-25 | Discharge: 2017-07-25 | Disposition: A | Discharge status: Home / Self Care | Payer: Medicare HMO | Attending: Family Medicine | Admitting: Family Medicine

## 2017-07-25 ENCOUNTER — Encounter (HOSPITAL_COMMUNITY): Payer: Self-pay | Admitting: Emergency Medicine

## 2017-07-25 DIAGNOSIS — H00015 Hordeolum externum left lower eyelid: Secondary | ICD-10-CM

## 2017-07-25 MED ORDER — TOBRAMYCIN 0.3 % OP SOLN: 1.0000 [drp] | Freq: Four times a day (QID) | OPHTHALMIC | 0 refills | Status: DC

## 2017-07-25 NOTE — ED Triage Notes (Signed)
Left eye pink and redness below left eye.  Patient [place] and has put drops in a family members eye that has a stye--not sure what is causing this.  No change in vision-"i can see fine"

## 2017-07-27 NOTE — ED Provider Notes (Signed)
   [facility]   [phone] 07/25/17 Arrival Time: 1858  ASSESSMENT & PLAN:  1. Hordeolum externum of left lower eyelid     Meds ordered this encounter  Medications  . tobramycin (TOBREX) 0.3 % ophthalmic solution    Sig: Place 1 drop into the left eye every 6 (six) hours.    Dispense:  5 mL    Refill:  0   Will f/u if not showing significant improvement within a few days. Reviewed expectations re: course of current medical issues. Questions answered. Outlined signs and symptoms indicating need for more acute intervention. Patient verbalized understanding. After Visit Summary given.   SUBJECTIVE:  Nicole Welch is a 61 y.o. female who presents with complaint of small bump on L lower eyelid. Gradual onset over a few days. Some discomfort. No eye drainage. No visual changes. No eye/skin injury. No OTC treatment. Does not wear contact lenses.  ROS: As per HPI.   OBJECTIVE:  Vitals:   07/25/17 1952  BP: (!) 177/79  Pulse: (!) 113  Resp: (!) 1  Temp: 98 F (36.7 C)  TempSrc: Oral  SpO2: 96%    General appearance: alert; no distress Eyes: PERRLA; EOMI; conjunctiva normal; small style on lower left medial eyelid HENT: normocephalic; atraumatic; TMs normal; nasal mucosa normal; oral mucosa normal Neck: supple Skin: warm and dry Psychological: alert and cooperative; normal mood and affect  No Known Allergies  Past Medical History:  Diagnosis Date  . Anxiety   . Back pain   . Chronic neck pain   . DDD (degenerative disc disease), cervical   . Depression   . DJD (degenerative joint disease)   . Gait abnormality   . Osteoporosis    Social History   Socioeconomic History  . Marital status: Married    Spouse name: Not on file  . Number of children: Not on file  . Years of education: ged   . Highest education level: Not on file  Social Needs  . Financial resource strain: Not on file  . Food insecurity - worry: Not on file  . Food insecurity -  inability: Not on file  . Transportation needs - medical: Not on file  . Transportation needs - non-medical: Not on file  Occupational History  . Occupation: Set designermanufacturing work   Tobacco Use  . Smoking status: Former Smoker    Types: Cigarettes    Last attempt to quit: 08/22/2013    Years since quitting: 3.9  Substance and Sexual Activity  . Alcohol use: No  . Drug use: No  . Sexual activity: Not on file  Other Topics Concern  . Not on file  Social History Narrative  . Not on file   Family History  Problem Relation Age of Onset  . Heart disease Unknown        family history   . Arthritis Unknown        family history    Past Surgical History:  Procedure Laterality Date  . ABDOMINAL HYSTERECTOMY    . CESAREAN SECTION    . TUBAL LIGATION       Mardella LaymanHagler, Erminia Mcnew, MD 07/27/17 [phone]

## 2017-08-03 ENCOUNTER — Other Ambulatory Visit: Payer: Self-pay | Admitting: Internal Medicine

## 2017-08-03 DIAGNOSIS — Z1231 Encounter for screening mammogram for malignant neoplasm of breast: Secondary | ICD-10-CM

## 2017-09-18 ENCOUNTER — Ambulatory Visit: Payer: Medicare HMO

## 2017-10-13 ENCOUNTER — Ambulatory Visit
Admission: RE | Admit: 2017-10-13 | Discharge: 2017-10-13 | Disposition: A | Discharge status: Home / Self Care | Payer: Medicare HMO | Source: Ambulatory Visit | Attending: Internal Medicine | Admitting: Internal Medicine

## 2017-10-13 DIAGNOSIS — Z1231 Encounter for screening mammogram for malignant neoplasm of breast: Secondary | ICD-10-CM

## 2018-06-16 DIAGNOSIS — Z23 Encounter for immunization: Secondary | ICD-10-CM | POA: Diagnosis not present

## 2018-06-16 DIAGNOSIS — F330 Major depressive disorder, recurrent, mild: Secondary | ICD-10-CM | POA: Diagnosis not present

## 2018-06-16 DIAGNOSIS — Z0001 Encounter for general adult medical examination with abnormal findings: Secondary | ICD-10-CM | POA: Diagnosis not present

## 2018-06-16 DIAGNOSIS — G894 Chronic pain syndrome: Secondary | ICD-10-CM | POA: Diagnosis not present

## 2018-06-16 DIAGNOSIS — M1991 Primary osteoarthritis, unspecified site: Secondary | ICD-10-CM | POA: Diagnosis not present

## 2018-06-16 DIAGNOSIS — Z6832 Body mass index (BMI) 32.0-32.9, adult: Secondary | ICD-10-CM | POA: Diagnosis not present

## 2018-06-16 DIAGNOSIS — E6609 Other obesity due to excess calories: Secondary | ICD-10-CM | POA: Diagnosis not present

## 2018-06-16 DIAGNOSIS — F419 Anxiety disorder, unspecified: Secondary | ICD-10-CM | POA: Diagnosis not present

## 2018-06-16 DIAGNOSIS — M810 Age-related osteoporosis without current pathological fracture: Secondary | ICD-10-CM | POA: Diagnosis not present

## 2018-07-13 ENCOUNTER — Encounter (HOSPITAL_COMMUNITY): Payer: Self-pay | Admitting: Emergency Medicine

## 2018-07-13 ENCOUNTER — Emergency Department (HOSPITAL_COMMUNITY): Payer: Medicare HMO

## 2018-07-13 ENCOUNTER — Other Ambulatory Visit: Payer: Self-pay

## 2018-07-13 ENCOUNTER — Inpatient Hospital Stay (HOSPITAL_COMMUNITY)
Admission: EM | Admit: 2018-07-13 | Discharge: 2018-07-23 | DRG: 291 | Disposition: A | Discharge status: Home Health | Payer: Medicare HMO | Attending: Internal Medicine | Admitting: Internal Medicine

## 2018-07-13 DIAGNOSIS — M549 Dorsalgia, unspecified: Secondary | ICD-10-CM | POA: Diagnosis present

## 2018-07-13 DIAGNOSIS — Z8249 Family history of ischemic heart disease and other diseases of the circulatory system: Secondary | ICD-10-CM

## 2018-07-13 DIAGNOSIS — Z7983 Long term (current) use of bisphosphonates: Secondary | ICD-10-CM

## 2018-07-13 DIAGNOSIS — E876 Hypokalemia: Secondary | ICD-10-CM | POA: Diagnosis not present

## 2018-07-13 DIAGNOSIS — I248 Other forms of acute ischemic heart disease: Secondary | ICD-10-CM | POA: Diagnosis present

## 2018-07-13 DIAGNOSIS — R05 Cough: Secondary | ICD-10-CM | POA: Diagnosis not present

## 2018-07-13 DIAGNOSIS — F17210 Nicotine dependence, cigarettes, uncomplicated: Secondary | ICD-10-CM | POA: Diagnosis present

## 2018-07-13 DIAGNOSIS — Z791 Long term (current) use of non-steroidal anti-inflammatories (NSAID): Secondary | ICD-10-CM | POA: Diagnosis not present

## 2018-07-13 DIAGNOSIS — E8770 Fluid overload, unspecified: Secondary | ICD-10-CM | POA: Diagnosis not present

## 2018-07-13 DIAGNOSIS — J9601 Acute respiratory failure with hypoxia: Secondary | ICD-10-CM | POA: Diagnosis present

## 2018-07-13 DIAGNOSIS — R531 Weakness: Secondary | ICD-10-CM | POA: Diagnosis not present

## 2018-07-13 DIAGNOSIS — I5031 Acute diastolic (congestive) heart failure: Secondary | ICD-10-CM | POA: Diagnosis not present

## 2018-07-13 DIAGNOSIS — Z720 Tobacco use: Secondary | ICD-10-CM | POA: Diagnosis present

## 2018-07-13 DIAGNOSIS — R0600 Dyspnea, unspecified: Secondary | ICD-10-CM

## 2018-07-13 DIAGNOSIS — F419 Anxiety disorder, unspecified: Secondary | ICD-10-CM | POA: Diagnosis present

## 2018-07-13 DIAGNOSIS — Z90710 Acquired absence of both cervix and uterus: Secondary | ICD-10-CM

## 2018-07-13 DIAGNOSIS — M810 Age-related osteoporosis without current pathological fracture: Secondary | ICD-10-CM | POA: Diagnosis not present

## 2018-07-13 DIAGNOSIS — I5020 Unspecified systolic (congestive) heart failure: Secondary | ICD-10-CM

## 2018-07-13 DIAGNOSIS — G8929 Other chronic pain: Secondary | ICD-10-CM | POA: Diagnosis present

## 2018-07-13 DIAGNOSIS — F410 Panic disorder [episodic paroxysmal anxiety] without agoraphobia: Secondary | ICD-10-CM | POA: Diagnosis present

## 2018-07-13 DIAGNOSIS — I2510 Atherosclerotic heart disease of native coronary artery without angina pectoris: Secondary | ICD-10-CM | POA: Diagnosis present

## 2018-07-13 DIAGNOSIS — E782 Mixed hyperlipidemia: Secondary | ICD-10-CM | POA: Diagnosis not present

## 2018-07-13 DIAGNOSIS — I5030 Unspecified diastolic (congestive) heart failure: Secondary | ICD-10-CM | POA: Diagnosis not present

## 2018-07-13 DIAGNOSIS — R931 Abnormal findings on diagnostic imaging of heart and coronary circulation: Secondary | ICD-10-CM | POA: Diagnosis not present

## 2018-07-13 DIAGNOSIS — I509 Heart failure, unspecified: Secondary | ICD-10-CM | POA: Diagnosis not present

## 2018-07-13 DIAGNOSIS — M5030 Other cervical disc degeneration, unspecified cervical region: Secondary | ICD-10-CM | POA: Diagnosis not present

## 2018-07-13 DIAGNOSIS — E785 Hyperlipidemia, unspecified: Secondary | ICD-10-CM | POA: Diagnosis not present

## 2018-07-13 DIAGNOSIS — R0602 Shortness of breath: Secondary | ICD-10-CM | POA: Diagnosis not present

## 2018-07-13 DIAGNOSIS — Z79899 Other long term (current) drug therapy: Secondary | ICD-10-CM

## 2018-07-13 DIAGNOSIS — I2489 Other forms of acute ischemic heart disease: Secondary | ICD-10-CM | POA: Diagnosis present

## 2018-07-13 LAB — TROPONIN I
TROPONIN I: 0.13 ng/mL — AB (ref <0.03)
Troponin I: 0.12 ng/mL (ref <0.03)

## 2018-07-13 LAB — CBC WITH DIFFERENTIAL/PLATELET
ABS IMMATURE GRANULOCYTES: 0.02 10*3/uL (ref 0.00–0.07)
Basophils Absolute: 0 10*3/uL (ref 0.0–0.1)
Basophils Relative: 0 %
Eosinophils Absolute: 0 10*3/uL (ref 0.0–0.5)
Eosinophils Relative: 1 %
HCT: 54.3 % — ABNORMAL HIGH (ref 36.0–46.0)
HEMOGLOBIN: 16.6 g/dL — AB (ref 12.0–15.0)
IMMATURE GRANULOCYTES: 0 %
LYMPHS ABS: 0.6 10*3/uL — AB (ref 0.7–4.0)
Lymphocytes Relative: 8 %
MCH: 31.2 pg (ref 26.0–34.0)
MCHC: 30.6 g/dL (ref 30.0–36.0)
MCV: 102.1 fL — AB (ref 80.0–100.0)
MONO ABS: 0.6 10*3/uL (ref 0.1–1.0)
MONOS PCT: 9 %
NEUTROS ABS: 5.6 10*3/uL (ref 1.7–7.7)
NEUTROS PCT: 82 %
Platelets: 308 10*3/uL (ref 150–400)
RBC: 5.32 MIL/uL — ABNORMAL HIGH (ref 3.87–5.11)
RDW: 15 % (ref 11.5–15.5)
WBC: 6.8 10*3/uL (ref 4.0–10.5)
nRBC: 0.3 % — ABNORMAL HIGH (ref 0.0–0.2)

## 2018-07-13 LAB — COMPREHENSIVE METABOLIC PANEL
ALT: 37 U/L (ref 0–44)
AST: 63 U/L — AB (ref 15–41)
Albumin: 3.2 g/dL — ABNORMAL LOW (ref 3.5–5.0)
Alkaline Phosphatase: 127 U/L — ABNORMAL HIGH (ref 38–126)
Anion gap: 10 (ref 5–15)
BILIRUBIN TOTAL: 2.1 mg/dL — AB (ref 0.3–1.2)
BUN: 8 mg/dL (ref 8–23)
CO2: 31 mmol/L (ref 22–32)
CREATININE: 0.69 mg/dL (ref 0.44–1.00)
Calcium: 8.5 mg/dL — ABNORMAL LOW (ref 8.9–10.3)
Chloride: 92 mmol/L — ABNORMAL LOW (ref 98–111)
GFR calc Af Amer: >60 mL/min (ref >60)
Glucose, Bld: 126 mg/dL — ABNORMAL HIGH (ref 70–99)
Potassium: 4.9 mmol/L (ref 3.5–5.1)
Sodium: 133 mmol/L — ABNORMAL LOW (ref 135–145)
TOTAL PROTEIN: 7.1 g/dL (ref 6.5–8.1)

## 2018-07-13 LAB — URINALYSIS, ROUTINE W REFLEX MICROSCOPIC
Bilirubin Urine: NEGATIVE
GLUCOSE, UA: NEGATIVE mg/dL
Ketones, ur: NEGATIVE mg/dL
Nitrite: NEGATIVE
PH: 7 (ref 5.0–8.0)
Protein, ur: NEGATIVE mg/dL
Specific Gravity, Urine: 1.004 — ABNORMAL LOW (ref 1.005–1.030)

## 2018-07-13 LAB — BRAIN NATRIURETIC PEPTIDE: B NATRIURETIC PEPTIDE 5: 1970 pg/mL — AB (ref 0.0–100.0)

## 2018-07-13 LAB — LACTIC ACID, PLASMA: LACTIC ACID, VENOUS: 1.3 mmol/L (ref 0.5–1.9)

## 2018-07-13 MED ORDER — OXYCODONE-ACETAMINOPHEN 5-325 MG PO TABS
1.0000 | ORAL_TABLET | Freq: Four times a day (QID) | ORAL | Status: DC | PRN
  Administered 2018-07-14 – 2018-07-22 (×11): 1 via ORAL
  Filled 2018-07-13 (×11): qty 1

## 2018-07-13 MED ORDER — FUROSEMIDE 10 MG/ML IJ SOLN
20.0000 mg | Freq: Once | INTRAMUSCULAR | Status: AC
  Administered 2018-07-13: 20 mg via INTRAVENOUS
  Filled 2018-07-13: qty 2

## 2018-07-13 MED ORDER — IPRATROPIUM-ALBUTEROL 0.5-2.5 (3) MG/3ML IN SOLN: 3.0000 mL | Freq: Four times a day (QID) | RESPIRATORY_TRACT | Status: DC | PRN

## 2018-07-13 MED ORDER — ALPRAZOLAM 1 MG PO TABS
1.0000 mg | ORAL_TABLET | Freq: Every evening | ORAL | Status: DC | PRN
  Administered 2018-07-13: 1 mg via ORAL
  Filled 2018-07-13: qty 1

## 2018-07-13 MED ORDER — ACETAMINOPHEN 650 MG RE SUPP: 650.0000 mg | Freq: Four times a day (QID) | RECTAL | Status: DC | PRN

## 2018-07-13 MED ORDER — SIMVASTATIN 20 MG PO TABS
40.0000 mg | ORAL_TABLET | Freq: Every day | ORAL | Status: DC
  Administered 2018-07-13 – 2018-07-22 (×10): 40 mg via ORAL
  Filled 2018-07-13 (×7): qty 2
  Filled 2018-07-13: qty 4
  Filled 2018-07-13 (×2): qty 2

## 2018-07-13 MED ORDER — EZETIMIBE 10 MG PO TABS
10.0000 mg | ORAL_TABLET | Freq: Every day | ORAL | Status: DC
  Administered 2018-07-13 – 2018-07-22 (×10): 10 mg via ORAL
  Filled 2018-07-13 (×10): qty 1

## 2018-07-13 MED ORDER — FUROSEMIDE 10 MG/ML IJ SOLN
40.0000 mg | Freq: Two times a day (BID) | INTRAMUSCULAR | Status: DC
  Administered 2018-07-14 – 2018-07-16 (×5): 40 mg via INTRAVENOUS
  Filled 2018-07-13 (×5): qty 4

## 2018-07-13 MED ORDER — ACETAMINOPHEN 325 MG PO TABS: 650.0000 mg | ORAL_TABLET | Freq: Four times a day (QID) | ORAL | Status: DC | PRN

## 2018-07-13 MED ORDER — ENOXAPARIN SODIUM 40 MG/0.4ML ~~LOC~~ SOLN
40.0000 mg | SUBCUTANEOUS | Status: DC
  Administered 2018-07-13 – 2018-07-22 (×10): 40 mg via SUBCUTANEOUS
  Filled 2018-07-13 (×10): qty 0.4

## 2018-07-13 MED ORDER — POLYETHYLENE GLYCOL 3350 17 G PO PACK: 17.0000 g | PACK | Freq: Every day | ORAL | Status: DC | PRN

## 2018-07-13 MED ORDER — SODIUM CHLORIDE 0.9 % IV BOLUS
1000.0000 mL | Freq: Once | INTRAVENOUS | Status: AC
  Administered 2018-07-13: 1000 mL via INTRAVENOUS

## 2018-07-13 MED ORDER — NITROGLYCERIN 2 % TD OINT
1.0000 [in_us] | TOPICAL_OINTMENT | Freq: Once | TRANSDERMAL | Status: AC
  Administered 2018-07-13: 1 [in_us] via TOPICAL
  Filled 2018-07-13: qty 1

## 2018-07-13 NOTE — ED Triage Notes (Signed)
Pt reports flu-like symptoms including generalized weakness, sore throat, and malaise since Sunday. Also c/o swelling to feet and to face. NAD noted.

## 2018-07-13 NOTE — ED Notes (Signed)
Date and time results received: 07/13/18 5:17 PM   Test: trop Critical Value: 0.12  Name of Provider Notified: [email or web address]  Orders Received? Or Actions Taken?:

## 2018-07-13 NOTE — ED Provider Notes (Signed)
[facility] EMERGENCY DEPARTMENT Provider Note   CSN: [id number] Arrival date & time: 07/13/18  1337     History   Chief Complaint Chief Complaint  Patient presents with  . Weakness    HPI Nicole Welch is a 62 y.o. female.  Patient presents with congestion and throat weakness.  Some shortness of breath  The history is provided by the patient. No language interpreter was used.  Weakness  Primary symptoms include no focal weakness. This is a new problem. The current episode started more than 2 days ago. The problem has not changed since onset.There was no focality noted. There has been no fever. Associated symptoms include shortness of breath. Pertinent negatives include no chest pain and no headaches. There were no medications administered prior to arrival. Associated medical issues do not include trauma.    Past Medical History:  Diagnosis Date  . Anxiety   . Back pain   . Chronic neck pain   . DDD (degenerative disc disease), cervical   . Depression   . DJD (degenerative joint disease)   . Gait abnormality   . Osteoporosis     Patient Active Problem List   Diagnosis Date Noted  . UTI (urinary tract infection) 04/23/2014  . Leukocytosis 04/22/2014  . Acute bronchitis 04/22/2014  . Other and unspecified hyperlipidemia 04/22/2014  . CLOSED FRACTURE OF CALCANEUS 11/06/2010    Past Surgical History:  Procedure Laterality Date  . ABDOMINAL HYSTERECTOMY    . CESAREAN SECTION    . TUBAL LIGATION       OB History   None      Home Medications    Prior to Admission medications   Medication Sig Start Date End Date Taking? Authorizing Provider  alendronate (FOSAMAX) 70 MG tablet Take 70 mg by mouth every Tuesday. Take with a full glass of water on an empty stomach. Takes on Saturdays    Yes [provider]  ALPRAZolam Prudy Feeler) 1 MG tablet Take 1 mg by mouth 4 (four) times daily as needed. anxiety 12/13/10  Yes [provider]  Calcium  Carb-Cholecalciferol (CALCIUM + D3) 600-200 MG-UNIT TABS Take 1 tablet by mouth daily.    Yes [provider]  ezetimibe (ZETIA) 10 MG tablet Take 10 mg by mouth at bedtime. 06/01/18  Yes [provider]  meloxicam (MOBIC) 15 MG tablet Take 15 mg by mouth daily as needed for pain.  09/28/10  Yes [provider]  oxyCODONE-acetaminophen (PERCOCET) 5-325 MG per tablet Take 1 tablet by mouth every 6 (six) hours as needed. Back pain 10/21/10  Yes [provider]  simvastatin (ZOCOR) 40 MG tablet Take 40 mg by mouth at bedtime. 04/26/18  Yes [provider]    Family History Family History  Problem Relation Age of Onset  . Heart disease Unknown        family history   . Arthritis Unknown        family history     Social History Social History   Tobacco Use  . Smoking status: Current Every Day Smoker    Packs/day: 1.00    Types: Cigarettes  . Smokeless tobacco: Never Used  Substance Use Topics  . Alcohol use: No  . Drug use: No     Allergies   Patient has no known allergies.   Review of Systems Review of Systems  Constitutional: Negative for appetite change and fatigue.  HENT: Negative for congestion, ear discharge and sinus pressure.  Drainage in the throat  Eyes: Negative for discharge.  Respiratory: Positive for shortness of breath. Negative for cough.   Cardiovascular: Negative for chest pain.  Gastrointestinal: Negative for abdominal pain and diarrhea.  Genitourinary: Negative for frequency and hematuria.  Musculoskeletal: Negative for back pain.  Skin: Negative for rash.  Neurological: Positive for weakness. Negative for focal weakness, seizures and headaches.  Psychiatric/Behavioral: Negative for hallucinations.     Physical Exam Updated Vital Signs BP 119/72   Pulse 96   Temp 98.7 F (37.1 C) (Oral)   Resp 17   Ht 5\' 2"  (1.575 m)   Wt 88.5 kg   SpO2 93%   BMI 35.67 kg/m   Physical Exam  Constitutional: She  is oriented to person, place, and time. She appears well-developed.  HENT:  Head: Normocephalic.  Eyes: Conjunctivae and EOM are normal. No scleral icterus.  Neck: Neck supple. No thyromegaly present.  Cardiovascular: Normal rate and regular rhythm. Exam reveals no gallop and no friction rub.  No murmur heard. Pulmonary/Chest: No stridor. She has wheezes. She has no rales. She exhibits no tenderness.  Abdominal: She exhibits no distension. There is no tenderness. There is no rebound.  Musculoskeletal: Normal range of motion. She exhibits no edema.  Lymphadenopathy:    She has no cervical adenopathy.  Neurological: She is oriented to person, place, and time. She exhibits normal muscle tone. Coordination normal.  Skin: No rash noted. No erythema.  Psychiatric: She has a normal mood and affect. Her behavior is normal.     ED Treatments / Results  Labs (all labs ordered are listed, but only abnormal results are displayed) Labs Reviewed  CBC WITH DIFFERENTIAL/PLATELET - Abnormal; Notable for the following components:      Result Value   RBC 5.32 (*)    Hemoglobin 16.6 (*)    HCT 54.3 (*)    MCV 102.1 (*)    nRBC 0.3 (*)    Lymphs Abs 0.6 (*)    All other components within normal limits  COMPREHENSIVE METABOLIC PANEL - Abnormal; Notable for the following components:   Sodium 133 (*)    Chloride 92 (*)    Glucose, Bld 126 (*)    Calcium 8.5 (*)    Albumin 3.2 (*)    AST 63 (*)    Alkaline Phosphatase 127 (*)    Total Bilirubin 2.1 (*)    All other components within normal limits  URINALYSIS, ROUTINE W REFLEX MICROSCOPIC - Abnormal; Notable for the following components:   APPearance HAZY (*)    Specific Gravity, Urine 1.004 (*)    Hgb urine dipstick SMALL (*)    Leukocytes, UA MODERATE (*)    Bacteria, UA RARE (*)    All other components within normal limits  TROPONIN I - Abnormal; Notable for the following components:   Troponin I 0.12 (*)    All other components within  normal limits  BRAIN NATRIURETIC PEPTIDE - Abnormal; Notable for the following components:   B Natriuretic Peptide 1,970.0 (*)    All other components within normal limits  URINE CULTURE  LACTIC ACID, PLASMA    EKG None  Radiology Dg Chest 2 View  Result Date: 07/13/2018 CLINICAL DATA:  Cough, congestion EXAM: CHEST - 2 VIEW COMPARISON:  04/22/2014 FINDINGS: There is bilateral diffuse mild interstitial thickening. There is a trace right pleural effusion. There is no left pleural effusion. There is no pneumothorax. There is stable cardiomegaly. There is no acute osseous abnormality. IMPRESSION: Findings most  concerning for mild CHF. Electronically Signed   By: Elige Ko   On: 07/13/2018 15:14    Procedures Procedures (including critical care time)  Medications Ordered in ED Medications  sodium chloride 0.9 % bolus 1,000 mL (0 mLs Intravenous Stopped 07/13/18 1550)  furosemide (LASIX) injection 20 mg (20 mg Intravenous Given 07/13/18 1726)  nitroGLYCERIN (NITROGLYN) 2 % ointment 1 inch (1 inch Topical Given 07/13/18 1829)     Initial Impression / Assessment and Plan / ED Course  I have reviewed the triage vital signs and the nursing notes.  Pertinent labs & imaging results that were available during my care of the patient were reviewed by me and considered in my medical decision making (see chart for details).     CRITICAL CARE Performed by: Bethann Berkshire Total critical care time: 40 minutes Critical care time was exclusive of separately billable procedures and treating other patients. Critical care was necessary to treat or prevent imminent or life-threatening deterioration. Critical care was time spent personally by me on the following activities: development of treatment plan with patient and/or surrogate as well as nursing, discussions with consultants, evaluation of patient's response to treatment, examination of patient, obtaining history from patient or surrogate,  ordering and performing treatments and interventions, ordering and review of laboratory studies, ordering and review of radiographic studies, pulse oximetry and re-evaluation of patient's condition.  Patient has congestive heart failure.  She is hypoxic with her sats at 85% on room air.  I spoke with cardiology and it was felt that the patient can be diuresed at any point hospital and have cardiology see her tomorrow Final Clinical Impressions(s) / ED Diagnoses   Final diagnoses:  Systolic heart failure, unspecified HF chronicity [facility])    ED Discharge Orders    None       Bethann Berkshire, MD 07/13/18 [phone]

## 2018-07-13 NOTE — ED Notes (Signed)
Placed pt on 2L nasal cannula. Sats increased to 90s.

## 2018-07-13 NOTE — H&P (Signed)
History and Physical    Nicole Welch ZOX:[id number] DOB: [birth year] DOA: 07/13/2018  PCP: Elfredia Nevins, MD   Patient coming from: Home  Chief Complaint: Leg swelling, generalized weakness  HPI: Nicole Welch is a 62 y.o. female with medical history significant for tobacco abuse, anxiety, who presented to the ED with reports of sore throat generalized weakness and malaise of 4 days duration, and leg swelling without pain or redness of 2 days duration.  She endorses stable 2 pillow use.  Denies chest pain or arm pain. Denies cough or shortness of breath.  Reports subjective fevers.  Denies dysuria or frequency.  Patient reports chronic unchanged back pain.   ED Course: Soft to stable blood pressure in the ED systolic 104 -131.  Mild intermittent tachypnea to 23.  O2 sats on ED arrival 85% on room air, improved with 2 L nasal cannula.  Two-view chest x-ray concerning for mild CHF, stable cardiomegaly.  BNP elevated 1970.  Troponin elevated 0.12. ALP and bilirubin mildly elevated otherwise unremarkable CBC CMP.  Patient was given 1L bolus, and then 20 mg IV Lasix.  Elevated troponin cardiology Dr. Eden Emms was consulted and recommended admission here at [facility]. Hospitalist was called to admit for new diagnosis of likely CHF.  Review of Systems: As per HPI all other systems reviewed and negative.  Past Medical History:  Diagnosis Date  . Anxiety   . Back pain   . Chronic neck pain   . DDD (degenerative disc disease), cervical   . Depression   . DJD (degenerative joint disease)   . Gait abnormality   . Osteoporosis     Past Surgical History:  Procedure Laterality Date  . ABDOMINAL HYSTERECTOMY    . CESAREAN SECTION    . TUBAL LIGATION       reports that she has been smoking cigarettes. She has been smoking about 1.00 pack per day. She has never used smokeless tobacco. She reports that she does not drink alcohol or use drugs.  No Known Allergies  Family History    Problem Relation Age of Onset  . Heart disease Unknown        family history   . Arthritis Unknown        family history     Prior to Admission medications   Medication Sig Start Date End Date Taking? Authorizing Provider  alendronate (FOSAMAX) 70 MG tablet Take 70 mg by mouth every Tuesday. Take with a full glass of water on an empty stomach. Takes on Saturdays    Yes [provider]  ALPRAZolam Prudy Feeler) 1 MG tablet Take 1 mg by mouth 4 (four) times daily as needed. anxiety 12/13/10  Yes [provider]  Calcium Carb-Cholecalciferol (CALCIUM + D3) 600-200 MG-UNIT TABS Take 1 tablet by mouth daily.    Yes [provider]  ezetimibe (ZETIA) 10 MG tablet Take 10 mg by mouth at bedtime. 06/01/18  Yes [provider]  meloxicam (MOBIC) 15 MG tablet Take 15 mg by mouth daily as needed for pain.  09/28/10  Yes [provider]  oxyCODONE-acetaminophen (PERCOCET) 5-325 MG per tablet Take 1 tablet by mouth every 6 (six) hours as needed. Back pain 10/21/10  Yes [provider]  simvastatin (ZOCOR) 40 MG tablet Take 40 mg by mouth at bedtime. 04/26/18  Yes [provider]    Physical Exam: Vitals:   07/13/18 1700 07/13/18 1730 07/13/18 1903 07/13/18 2000  BP:   120/67 106/65  Pulse: 100 96  97 94  Resp:   17 18  Temp:      TempSrc:      SpO2: 95% 93% 95% 92%  Weight:      Height:        Constitutional: NAD, calm, comfortable Vitals:   07/13/18 1700 07/13/18 1730 07/13/18 1903 07/13/18 2000  BP:   120/67 106/65  Pulse: 100 96 97 94  Resp:   17 18  Temp:      TempSrc:      SpO2: 95% 93% 95% 92%  Weight:      Height:       Eyes: PERRL, lids and conjunctivae normal ENMT: Mucous membranes are moist. Posterior pharynx clear of any exudate or lesions.Normal dentition.  Neck: normal, supple, no masses, no thyromegaly Respiratory: Diffusely decreased air entry bilaterally without wheezing, crackles not appreciated. Normal respiratory  effort. No accessory muscle use.  Cardiovascular: Regular rate and rhythm, 3/6 new systolic murmurs / no rubs / gallops. 1+ pitting pedal edema bilaterally to mid leg, 2+ pedal pulses. Abdomen: no tenderness, no masses palpated. No hepatosplenomegaly. Bowel sounds positive.  Musculoskeletal: no clubbing / cyanosis. No joint deformity upper and lower extremities. Good ROM, no contractures. Normal muscle tone.  Skin: no rashes, lesions, ulcers. No induration Neurologic: CN 2-12 grossly intact.  Strength 5/5 in all 4.  Psychiatric: Normal judgment and insight. Alert and oriented x 3. Normal mood.   Labs on Admission: I have personally reviewed following labs and imaging studies  CBC: Recent Labs  Lab 07/13/18 1403  WBC 6.8  NEUTROABS 5.6  HGB 16.6*  HCT 54.3*  MCV 102.1*  PLT 308   Basic Metabolic Panel: Recent Labs  Lab 07/13/18 1403  NA 133*  K 4.9  CL 92*  CO2 31  GLUCOSE 126*  BUN 8  CREATININE 0.69  CALCIUM 8.5*   Liver Function Tests: Recent Labs  Lab 07/13/18 1403  AST 63*  ALT 37  ALKPHOS 127*  BILITOT 2.1*  PROT 7.1  ALBUMIN 3.2*   Cardiac Enzymes: Recent Labs  Lab 07/13/18 1403  TROPONINI 0.12*   Urine analysis:    Component Value Date/Time   COLORURINE YELLOW 07/13/2018 1404   APPEARANCEUR HAZY (A) 07/13/2018 1404   LABSPEC 1.004 (L) 07/13/2018 1404   PHURINE 7.0 07/13/2018 1404   GLUCOSEU NEGATIVE 07/13/2018 1404   HGBUR SMALL (A) 07/13/2018 1404   BILIRUBINUR NEGATIVE 07/13/2018 1404   KETONESUR NEGATIVE 07/13/2018 1404   PROTEINUR NEGATIVE 07/13/2018 1404   UROBILINOGEN 0.2 04/22/2014 1810   NITRITE NEGATIVE 07/13/2018 1404   LEUKOCYTESUR MODERATE (A) 07/13/2018 1404    Radiological Exams on Admission: Dg Chest 2 View  Result Date: 07/13/2018 CLINICAL DATA:  Cough, congestion EXAM: CHEST - 2 VIEW COMPARISON:  04/22/2014 FINDINGS: There is bilateral diffuse mild interstitial thickening. There is a trace right pleural effusion. There is  no left pleural effusion. There is no pneumothorax. There is stable cardiomegaly. There is no acute osseous abnormality. IMPRESSION: Findings most concerning for mild CHF. Electronically Signed   By: Elige Ko   On: 07/13/2018 15:14    EKG: Independently reviewed.  Sinus rhythm QTC 443 normal intervals  Assessment/Plan Principal Problem:   Fluid overload Active Problems:   Tobacco abuse   Anxiety   Fluid overload-pedal edema bilateral, hypoxic to 85% on room air, 2 view chest x-ray suggestive of mild CHF with stable cardiomegaly.  BNP markedly elevated 1970, no priors. No Echo on file.  Reports initial sore throat- ?  Viral etiology for cardiac decompensation. Elevated troponin  0.12, without chest pain.  IV Lasix 20 mg given in ED -Repeat IV Lasix 201 then continue Lasix 40 BID - Strict input output, daily weights - CMP a.m, with mild elevated Bilirubin and ALP - Monitor blood pressures while diuresing  Elevated troponin- 0.12 >> 0.13. ? troponin leak from the myopathy vs demand Ischemia.  EKG without ST or T wave abnormalities. No chest pain, shortness of breath but patient hypoxic.  Tobacco abuse, dyslipidemia. -Troponin trend - ECHO - Cardiology consult order placed. -Continue home Zetia and Zocor  Tobacco abuse  Anxiety-  -Continue home Xanax 1 mg PRN at night  Chronic back pain-continue home Percocets as needed every q6H  DVT prophylaxis: Lovenox Code Status: Full Family Communication: Daughter at bedside Disposition Plan: Per rounding team Consults called: CArdiology Admission status: Obs, tele   Onnie Boer MD [facility] Pager [phone] From 3PM-11PM.  Otherwise please contact night-coverage [email or web address] Password [facility]  07/13/2018, 11:04 PM

## 2018-07-13 NOTE — ED Notes (Signed)
Pt walked to restroom on RA with NT for urine sample. When pt came back to stretcher, pt's O2 sat was 58% on RA. Pt denies SOB.

## 2018-07-14 ENCOUNTER — Encounter (HOSPITAL_COMMUNITY): Payer: Self-pay | Admitting: *Deleted

## 2018-07-14 ENCOUNTER — Observation Stay (HOSPITAL_COMMUNITY): Payer: Medicare HMO

## 2018-07-14 DIAGNOSIS — R531 Weakness: Secondary | ICD-10-CM | POA: Diagnosis present

## 2018-07-14 DIAGNOSIS — Z720 Tobacco use: Secondary | ICD-10-CM | POA: Diagnosis not present

## 2018-07-14 DIAGNOSIS — E782 Mixed hyperlipidemia: Secondary | ICD-10-CM

## 2018-07-14 DIAGNOSIS — I2510 Atherosclerotic heart disease of native coronary artery without angina pectoris: Secondary | ICD-10-CM | POA: Diagnosis present

## 2018-07-14 DIAGNOSIS — Z79899 Other long term (current) drug therapy: Secondary | ICD-10-CM | POA: Diagnosis not present

## 2018-07-14 DIAGNOSIS — E785 Hyperlipidemia, unspecified: Secondary | ICD-10-CM | POA: Diagnosis present

## 2018-07-14 DIAGNOSIS — I248 Other forms of acute ischemic heart disease: Secondary | ICD-10-CM

## 2018-07-14 DIAGNOSIS — M810 Age-related osteoporosis without current pathological fracture: Secondary | ICD-10-CM | POA: Diagnosis present

## 2018-07-14 DIAGNOSIS — I5030 Unspecified diastolic (congestive) heart failure: Secondary | ICD-10-CM | POA: Diagnosis not present

## 2018-07-14 DIAGNOSIS — I509 Heart failure, unspecified: Secondary | ICD-10-CM | POA: Diagnosis not present

## 2018-07-14 DIAGNOSIS — E876 Hypokalemia: Secondary | ICD-10-CM | POA: Diagnosis not present

## 2018-07-14 DIAGNOSIS — Z7983 Long term (current) use of bisphosphonates: Secondary | ICD-10-CM | POA: Diagnosis not present

## 2018-07-14 DIAGNOSIS — F410 Panic disorder [episodic paroxysmal anxiety] without agoraphobia: Secondary | ICD-10-CM | POA: Diagnosis present

## 2018-07-14 DIAGNOSIS — F17210 Nicotine dependence, cigarettes, uncomplicated: Secondary | ICD-10-CM | POA: Diagnosis present

## 2018-07-14 DIAGNOSIS — I5031 Acute diastolic (congestive) heart failure: Secondary | ICD-10-CM | POA: Diagnosis present

## 2018-07-14 DIAGNOSIS — G8929 Other chronic pain: Secondary | ICD-10-CM | POA: Diagnosis present

## 2018-07-14 DIAGNOSIS — M549 Dorsalgia, unspecified: Secondary | ICD-10-CM | POA: Diagnosis present

## 2018-07-14 DIAGNOSIS — F419 Anxiety disorder, unspecified: Secondary | ICD-10-CM | POA: Diagnosis not present

## 2018-07-14 DIAGNOSIS — J9601 Acute respiratory failure with hypoxia: Secondary | ICD-10-CM | POA: Diagnosis present

## 2018-07-14 DIAGNOSIS — E8770 Fluid overload, unspecified: Secondary | ICD-10-CM | POA: Diagnosis not present

## 2018-07-14 DIAGNOSIS — Z791 Long term (current) use of non-steroidal anti-inflammatories (NSAID): Secondary | ICD-10-CM | POA: Diagnosis not present

## 2018-07-14 DIAGNOSIS — Z8249 Family history of ischemic heart disease and other diseases of the circulatory system: Secondary | ICD-10-CM | POA: Diagnosis not present

## 2018-07-14 DIAGNOSIS — Z90710 Acquired absence of both cervix and uterus: Secondary | ICD-10-CM | POA: Diagnosis not present

## 2018-07-14 DIAGNOSIS — M5030 Other cervical disc degeneration, unspecified cervical region: Secondary | ICD-10-CM | POA: Diagnosis present

## 2018-07-14 LAB — COMPREHENSIVE METABOLIC PANEL
ALT: 32 U/L (ref 0–44)
ANION GAP: 8 (ref 5–15)
AST: 40 U/L (ref 15–41)
Albumin: 3 g/dL — ABNORMAL LOW (ref 3.5–5.0)
Alkaline Phosphatase: 116 U/L (ref 38–126)
BUN: 8 mg/dL (ref 8–23)
CHLORIDE: 93 mmol/L — AB (ref 98–111)
CO2: 35 mmol/L — AB (ref 22–32)
CREATININE: 0.68 mg/dL (ref 0.44–1.00)
Calcium: 7.8 mg/dL — ABNORMAL LOW (ref 8.9–10.3)
Glucose, Bld: 117 mg/dL — ABNORMAL HIGH (ref 70–99)
Potassium: 3.9 mmol/L (ref 3.5–5.1)
SODIUM: 136 mmol/L (ref 135–145)
Total Bilirubin: 1.4 mg/dL — ABNORMAL HIGH (ref 0.3–1.2)
Total Protein: 6.7 g/dL (ref 6.5–8.1)

## 2018-07-14 LAB — ECHOCARDIOGRAM COMPLETE
HEIGHTINCHES: 62 in
WEIGHTICAEL: 3166.4 [oz_av]

## 2018-07-14 LAB — TROPONIN I: TROPONIN I: 0.14 ng/mL — AB (ref <0.03)

## 2018-07-14 MED ORDER — PERFLUTREN LIPID MICROSPHERE
1.0000 mL | INTRAVENOUS | Status: AC | PRN
  Administered 2018-07-14: 2 mL via INTRAVENOUS

## 2018-07-14 MED ORDER — ALPRAZOLAM 1 MG PO TABS
1.0000 mg | ORAL_TABLET | Freq: Two times a day (BID) | ORAL | Status: DC | PRN
  Administered 2018-07-14 – 2018-07-15 (×3): 1 mg via ORAL
  Filled 2018-07-14 (×3): qty 1

## 2018-07-14 NOTE — Plan of Care (Signed)
Nutrition Education Note  RD consulted for nutrition education regarding onset CHF.   Patient is a  62 y.o. female with PMH: smoking tobacco, chronic back/neck pain, depression who presents fluid overloaded.  RD provided "Heart Healthy Nutrition Therapy" handout from the Academy of [employer]. Reviewed patient's dietary recall. Her usual intake is 2 meals daily. Breakfast and Dinner with a daily snack mid-day. Breakfast: oatmeal during the week and biscuit on the weekend. Dinner is variety of spaghetti, salmon cakes, chicken, canned vegetables. We discussed pouring the liquid out and rinsing, heating in fresh water to decrease sodium content.   Provided examples on ways to decrease sodium intake in diet. Discouraged intake of processed foods- particularly (bacon, sausage, cheeses) and use of salt shaker. Encouraged fresh fruits and vegetables as well as whole grain sources of carbohydrates to maximize fiber intake.   RD discussed why it is important for patient to adhere to diet recommendations, and emphasized the role of fluids, foods to avoid, and importance of weighing self daily. Teach back method used.  Expect good compliance with the help and support of her daughter. Home health nurse will follow up with her as well.    Body mass index is 36.2 kg/m. Pt meets criteria for obesity based on current BMI. Patient is unable to provide usual body weight.  Current diet order is Heart Healthy, patient is consuming approximately >75% of meals at this time.   Labs and medications reviewed. No further nutrition interventions warranted at this time. RD contact information provided. If additional nutrition issues arise, please re-consult RD.   Royann Shivers MS,RD,CSG,LDN Office: [phone] Pager: [phone]

## 2018-07-14 NOTE — Consult Note (Addendum)
Cardiology Consult    Patient ID: Nicole Welch; [id number]; Sep 19, 1956   Admit date: 07/13/2018 Date of Consult: 07/14/2018  Primary Care Provider: Redmond School, MD Primary Cardiologist: [facility] - Dr. Bronson Ing  Patient Profile    Nicole Welch is a 62 y.o. female with past medical history of HLD, anxiety, chronic back pain, tobacco use and family history of CAD who is being seen today for the evaluation of elevated troponin and CHF at the request of Dr. Denton Brick.   History of Present Illness    [name] presented to [facility] ED on 07/13/2018 for evaluation of generalized weakness, malaise, and dyspnea over the past few days.  In talking with the patient and her daughter today, she reports being in her usual state of health over the past few weeks but starting on Sunday, she started to develop sinus congestion and nasal discharge. She reports associated fatigue during that timeframe but denies any specific dyspnea, chest pain, palpitations, orthopnea, or PND. She has noticed occasional swelling along her ankles bilaterally over the past few weeks which typically resolves with elevation of her lower extremities.  She has known HLD but denies any history of HTN, Type II DM, CAD, or CHF. Previously evaluated by Cardiology 20+ years ago and reports having an echocardiogram at that time which was "normal". Reports that her dad died from a heart attack in his 69's. She denies any alcohol use or drug use. She does smoke approximately 1 pack of cigarettes per day and has done so for over 40 years. Has been under increased stress at home due to marital complications and also raising her grandson who has autism.  While in the emergency department, her oxygen saturations were found to be at 85% on room air and improved to 90% with 2 L nasal cannula. Has been afebrile since admission. Initial labs show WBC 6.8, Hgb 16.6, platelets 308, Na+ 133, K+ 4.9, creatinine 0.69.  AST  63, ALT 37, and Alk Phos 127. BNP elevated to 1970.  EKG shows NSR, HR 95, with anterior Q-waves and nonspecific IVCD (no prior tracings available for comparison). UA showing moderate leukocytes with urine culture pending. Initial troponin found to be elevated to 0.12 with repeat values of 0.13 and 0.14.  CXR shows bilateral diffuse interstitial thickening and a trace right pleural effusion, overall consistent with CHF.  She received 2 doses of IV Lasix 20 mg while in the ED and has urinated over 10 times by her report.  Scheduled to receive IV Lasix 40 mg twice daily starting today.   Past Medical History:  Diagnosis Date  . Anxiety   . Back pain   . Chronic neck pain   . DDD (degenerative disc disease), cervical   . Depression   . DJD (degenerative joint disease)   . Gait abnormality   . Osteoporosis     Past Surgical History:  Procedure Laterality Date  . ABDOMINAL HYSTERECTOMY    . CESAREAN SECTION    . TUBAL LIGATION       Home Medications:  Prior to Admission medications   Medication Sig Start Date End Date Taking? Authorizing Provider  alendronate (FOSAMAX) 70 MG tablet Take 70 mg by mouth every Tuesday. Take with a full glass of water on an empty stomach. Takes on Saturdays    Yes [provider]  ALPRAZolam Duanne Moron) 1 MG tablet Take 1 mg by mouth 4 (four) times daily as needed. anxiety 12/13/10  Yes [provider]  Calcium Carb-Cholecalciferol (CALCIUM + D3) 600-200 MG-UNIT TABS Take 1 tablet by mouth daily.    Yes [provider]  ezetimibe (ZETIA) 10 MG tablet Take 10 mg by mouth at bedtime. 06/01/18  Yes [provider]  meloxicam (MOBIC) 15 MG tablet Take 15 mg by mouth daily as needed for pain.  09/28/10  Yes [provider]  oxyCODONE-acetaminophen (PERCOCET) 5-325 MG per tablet Take 1 tablet by mouth every 6 (six) hours as needed. Back pain 10/21/10  Yes [provider]  simvastatin (ZOCOR) 40 MG tablet Take 40 mg by  mouth at bedtime. 04/26/18  Yes [provider]    Inpatient Medications: Scheduled Meds: . enoxaparin (LOVENOX) injection  40 mg Subcutaneous Q24H  . ezetimibe  10 mg Oral QHS  . furosemide  40 mg Intravenous BID  . simvastatin  40 mg Oral QHS   Continuous Infusions:  PRN Meds: acetaminophen **OR** acetaminophen, ALPRAZolam, ipratropium-albuterol, oxyCODONE-acetaminophen, polyethylene glycol  Allergies:   No Known Allergies  Social History:   Social History   Socioeconomic History  . Marital status: Married    Spouse name: Not on file  . Number of children: Not on file  . Years of education: ged   . Highest education level: Not on file  Occupational History  . Occupation: Psychologist, educational work   Scientific laboratory technician  . Financial resource strain: Not on file  . Food insecurity:    Worry: Not on file    Inability: Not on file  . Transportation needs:    Medical: Not on file    Non-medical: Not on file  Tobacco Use  . Smoking status: Current Every Day Smoker    Packs/day: 1.00    Types: Cigarettes  . Smokeless tobacco: Never Used  Substance and Sexual Activity  . Alcohol use: No  . Drug use: No  . Sexual activity: Not on file  Lifestyle  . Physical activity:    Days per week: Not on file    Minutes per session: Not on file  . Stress: Not on file  Relationships  . Social connections:    Talks on phone: Not on file    Gets together: Not on file    Attends religious service: Not on file    Active member of club or organization: Not on file    Attends meetings of clubs or organizations: Not on file    Relationship status: Not on file  . Intimate partner violence:    Fear of current or ex partner: Not on file    Emotionally abused: Not on file    Physically abused: Not on file    Forced sexual activity: Not on file  Other Topics Concern  . Not on file  Social History Narrative  . Not on file     Family History:    Family History  Problem Relation Age of  Onset  . Heart disease Unknown        family history   . Arthritis Unknown        family history   . CAD Father        71's      Review of Systems    General:  No chills, fever, night sweats or weight changes.  Cardiovascular:  No chest pain, orthopnea, palpitations, paroxysmal nocturnal dyspnea. Positive for mild dyspnea and edema.  Dermatological: No rash, lesions/masses Respiratory: No cough. Positive for nasal discharge.  Urologic: No hematuria, dysuria Abdominal:   No nausea, vomiting,  diarrhea, bright red blood per rectum, melena, or hematemesis Neurologic:  No visual changes, wkns, changes in mental status.  All other systems reviewed and are otherwise negative except as noted above.  Physical Exam/Data    Vitals:   07/13/18 2205 07/13/18 2300 07/14/18 0624 07/14/18 0815  BP: 104/65 122/64 103/60   Pulse: 98 100 95   Resp: (!) 23 18 19    Temp:  97.6 F (36.4 C) 97.9 F (36.6 C)   TempSrc:  Oral Oral   SpO2: 91% 95% (!) 89% 93%  Weight:  89.8 kg 89.8 kg   Height:  5' 2"  (1.575 m)      Intake/Output Summary (Last 24 hours) at 07/14/2018 0851 Last data filed at 07/14/2018 0600 Gross per 24 hour  Intake 1000 ml  Output 425 ml  Net 575 ml   Filed Weights   07/13/18 1343 07/13/18 2300 07/14/18 0624  Weight: 88.5 kg 89.8 kg 89.8 kg   Body mass index is 36.2 kg/m.   General: Pleasant, Caucasian female appearing in NAD Psych: Normal affect. Neuro: Alert and oriented X 3. Moves all extremities spontaneously. HEENT: Normal  Neck: Supple without bruits. JVD at 9cm. Lungs:  Resp regular and unlabored, rales along bases bilaterally. On 2L [place].  Heart: RRR no s3, s4, or murmurs. Abdomen: Soft, non-tender, non-distended, BS + x 4.  Extremities: No clubbing or cyanosis. Trace lower extremity edema. DP/PT/Radials 2+ and equal bilaterally.   EKG:  The EKG was personally reviewed and demonstrates: NSR, HR 95, with anterior Q-waves and nonspecific IVCD (no prior  tracings available for comparison)   Labs/Studies     Relevant CV Studies:  None on File - Echocardiogram Pending  Laboratory Data:  Chemistry Recent Labs  Lab 07/13/18 1403 07/14/18 0209  NA 133* 136  K 4.9 3.9  CL 92* 93*  CO2 31 35*  GLUCOSE 126* 117*  BUN 8 8  CREATININE 0.69 0.68  CALCIUM 8.5* 7.8*  GFRNONAA >60 >60  GFRAA >60 >60  ANIONGAP 10 8    Recent Labs  Lab 07/13/18 1403 07/14/18 0209  PROT 7.1 6.7  ALBUMIN 3.2* 3.0*  AST 63* 40  ALT 37 32  ALKPHOS 127* 116  BILITOT 2.1* 1.4*   Hematology Recent Labs  Lab 07/13/18 1403  WBC 6.8  RBC 5.32*  HGB 16.6*  HCT 54.3*  MCV 102.1*  MCH 31.2  MCHC 30.6  RDW 15.0  PLT 308   Cardiac Enzymes Recent Labs  Lab 07/13/18 1403 07/13/18 2017 07/14/18 0209  TROPONINI 0.12* 0.13* 0.14*   No results for input(s): TROPIPOC in the last 168 hours.  BNP Recent Labs  Lab 07/13/18 1403  BNP 1,970.0*    DDimer No results for input(s): DDIMER in the last 168 hours.  Radiology/Studies:  Dg Chest 2 View  Result Date: 07/13/2018 CLINICAL DATA:  Cough, congestion EXAM: CHEST - 2 VIEW COMPARISON:  04/22/2014 FINDINGS: There is bilateral diffuse mild interstitial thickening. There is a trace right pleural effusion. There is no left pleural effusion. There is no pneumothorax. There is stable cardiomegaly. There is no acute osseous abnormality. IMPRESSION: Findings most concerning for mild CHF. Electronically Signed   By: Kathreen Devoid   On: 07/13/2018 15:14     Assessment & Plan    1. Acute CHF Exacerbation (Echo pending to determine Systolic versus Diastolic dysfunction) - presented with mixed symptoms of nasal discharge, sinus congestion and generalized weakness. She denies any changes in her respiratory status but her daughter  does report she has been less active and has experienced difficulty when lying down to go to sleep at night but accredited this to her back pain. - was hypoxic at 85% on RA upon  admission but saturations are appropriate on 2L [place] currently. BNP elevated to 1970 and CXR shows bilateral diffuse interstitial thickening and a trace right pleural effusion, overall consistent with CHF. - she has been started on IV Lasix 66m BID and while accurate I&O's have not been recorded she repots having urinated over 10 times since her arrival. Would continue with IV Lasix at current dosing as she is responding well to this and creatinine has remained stable at 0.68. Echocardiogram is pending to assess for structural abnormalities. Continue to follow I&O's along with daily weights.   2. Elevated Troponin  - she denies any recent chest pain or dyspnea on exertion. Cyclic values have been flat at 0.12, 0.13 and 0.14 which is most consistent with demand ischemia in the setting of acute volume overload. EKG shows NSR, HR 95, with anterior Q-waves and nonspecific IVCD (no prior tracings available for comparison). She denies any known cardiac history and cardiac risk factors include HLD, tobacco use, and family history of CAD.  - plan to obtain an echocardiogram to assess LV function and wall motion. If no significant abnormalities, can consider stress testing as an outpatient.   3. HLD -Followed by PCP. She has been continued on PTA Zetia 10 mg daily and Simvastatin 40 mg daily.  4. Tobacco Use - she has a 40+ pack-year history. Cessation was advised. Reports home stressors sometimes exacerbate her use.    For questions or updates, please contact [email or web address] consult [email or web address] for contact info under Cardiology/STEMI.  Signed, BErma Heritage PA-C 07/14/2018, 8:51 AM Pager: [phone] The patient was seen and examined, and I agree with the history, physical exam, assessment and plan as documented above, with modifications as noted below. I have also personally reviewed all relevant documentation, old records, labs, and both radiographic and cardiovascular studies. I have  also independently interpreted old and new ECG's.  Briefly, this is a 62year old woman with a history of tobacco abuse and a family history of coronary disease whom we are asked to evaluate for elevated troponin and CHF.  She began developing some sinus congestion and nasal discharge and then noticed swelling of her hands and feet for which she came to the ED.  She denies chest pain and shortness of breath.  She was found to be hypoxic with markedly elevated BNP of 1970.  Chest x-ray showed findings consistent with CHF.  She received IV Lasix in the ED but her daughter does not think the urine was collected for measurement.  She and her daughter endorse having urinated multiple times since then.  The patient says she is feeling much better.  I doubt the accuracy of I's and O's.  I independently reviewed the ECG which demonstrates sinus rhythm with nonspecific T wave abnormalities and anterolateral infarct pattern.  An echocardiogram has been ordered which I will review to evaluate cardiac structure and function.  Troponins were 0.12 followed by 0.14.  She would benefit from an outpatient stress test given her cardiovascular risk factors.  I spoke to her about her dietary habits.  She occasionally eats fast food particularly when there is marital conflict.  She also eats canned foods but also tries to eat fresh salads.  The patient and her daughter would like some guidance regarding dietary modification.  I will place a nutrition consult.  Continue IV Lasix 40 mg twice daily.  We will reassess tomorrow.  Kate Sable, MD, [facility]  07/14/2018 10:42 AM

## 2018-07-14 NOTE — Progress Notes (Signed)
Pt continues to miss hat in commode. Output amounts are inaccurate. Pt educated on the importance of using hat and measuring output. Verbalized understanding.

## 2018-07-14 NOTE — Progress Notes (Signed)
CRITICAL VALUE ALERT  Critical Value:  Troponin .14  Date & Time Notied: 07-14-18 0320  Provider Notified: Sharl Ma, md  Orders Received/Actions taken: no new orders received

## 2018-07-14 NOTE — Discharge Summary (Signed)
PROGRESS NOTE    Nicole Welch  ZOX:[id number] DOB: 16-Aug-1956 DOA: 07/13/2018 PCP: Elfredia Nevins, MD    Brief Narrative:  62 year old female admitted to the hospital with shortness of breath and generalized weakness.  Found to have decompensated CHF exacerbation with mild elevation of troponin felt to be related to demand ischemia.  Admitted for intravenous diuresis.  Cardiology following.   Assessment & Plan:   Principal Problem:   Fluid overload Active Problems:   Tobacco abuse   Anxiety   Acute diastolic CHF (congestive heart failure) (HCC)   Demand ischemia (HCC)   Acute respiratory failure with hypoxia (HCC)   1. Acute diastolic congestive heart failure.  Currently on intravenous Lasix.  Still has evidence of volume overload.  Overall shortness of breath is improving.  Cardiology following.  Echo shows normal ejection fraction.  Continue current treatments. 2. Acute respiratory failure with hypoxia.  Related to CHF exacerbation.  Continue to wean down oxygen as tolerated. 3. Demand ischemia.  Noted to have mildly elevated troponins.  No complaints of chest pain.  Echocardiogram does show some wall motion abnormalities, unclear if these are old.  Further ischemic work-up per cardiology. 4. Anxiety.  Continue on Xanax as needed. 5. Tobacco abuse 6. Hyperlipidemia.  Continue statin   DVT prophylaxis: Lovenox Code Status: Full code Family Communication: Discussed with daughter at the bedside Disposition Plan: Discharge home once improved   Consultants:   Cardiology  Procedures:  Echo: - Left ventricle: The cavity size was normal. Mild proximal septal   hypertrophy. Systolic function was normal. The estimated ejection   fraction was in the range of 50% to 55%. Doppler parameters are   consistent with abnormal left ventricular relaxation (grade 1   diastolic dysfunction). Doppler parameters are consistent with   high ventricular filling pressure. - Regional  wall motion abnormality: Mild hypokinesis of the apical   anterior, apical inferior, apical septal, and apical myocardium.  - Aortic valve: There was trivial regurgitation.  Antimicrobials:      Subjective: Shortness of breath improving, no chest pain  Objective: Vitals:   07/13/18 2300 07/14/18 0624 07/14/18 0815 07/14/18 1313  BP: 122/64 103/60  (!) 102/42  Pulse: 100 95  (!) 52  Resp: 18 19  20   Temp: 97.6 F (36.4 C) 97.9 F (36.6 C)  98.4 F (36.9 C)  TempSrc: Oral Oral  Oral  SpO2: 95% (!) 89% 93% 90%  Weight: 89.8 kg 89.8 kg    Height: 5\' 2"  (1.575 m)       Intake/Output Summary (Last 24 hours) at 07/14/2018 1829 Last data filed at 07/14/2018 1800 Gross per 24 hour  Intake 840 ml  Output 1975 ml  Net -1135 ml   Filed Weights   07/13/18 1343 07/13/18 2300 07/14/18 0624  Weight: 88.5 kg 89.8 kg 89.8 kg    Examination:  General exam: Appears calm and comfortable  Respiratory system: crackles at bases. Respiratory effort normal. Cardiovascular system: S1 & S2 heard, RRR. No JVD, murmurs, rubs, gallops or clicks. 1+ pedal edema. Gastrointestinal system: Abdomen is nondistended, soft and nontender. No organomegaly or masses felt. Normal bowel sounds heard. Central nervous system: Alert and oriented. No focal neurological deficits. Extremities: Symmetric 5 x 5 power. Skin: No rashes, lesions or ulcers Psychiatry: Judgement and insight appear normal. Mood & affect appropriate.     Data Reviewed: I have personally reviewed following labs and imaging studies  CBC: Recent Labs  Lab 07/13/18 1403  WBC 6.8  NEUTROABS  5.6  HGB 16.6*  HCT 54.3*  MCV 102.1*  PLT 308   Basic Metabolic Panel: Recent Labs  Lab 07/13/18 1403 07/14/18 0209  NA 133* 136  K 4.9 3.9  CL 92* 93*  CO2 31 35*  GLUCOSE 126* 117*  BUN 8 8  CREATININE 0.69 0.68  CALCIUM 8.5* 7.8*   GFR: Estimated Creatinine Clearance: 75 mL/min (by C-G formula based on SCr of 0.68  mg/dL). Liver Function Tests: Recent Labs  Lab 07/13/18 1403 07/14/18 0209  AST 63* 40  ALT 37 32  ALKPHOS 127* 116  BILITOT 2.1* 1.4*  PROT 7.1 6.7  ALBUMIN 3.2* 3.0*   No results for input(s): LIPASE, AMYLASE in the last 168 hours. No results for input(s): AMMONIA in the last 168 hours. Coagulation Profile: No results for input(s): INR, PROTIME in the last 168 hours. Cardiac Enzymes: Recent Labs  Lab 07/13/18 1403 07/13/18 2017 07/14/18 0209  TROPONINI 0.12* 0.13* 0.14*   BNP (last 3 results) No results for input(s): PROBNP in the last 8760 hours. HbA1C: No results for input(s): HGBA1C in the last 72 hours. CBG: No results for input(s): GLUCAP in the last 168 hours. Lipid Profile: No results for input(s): CHOL, HDL, LDLCALC, TRIG, CHOLHDL, LDLDIRECT in the last 72 hours. Thyroid Function Tests: No results for input(s): TSH, T4TOTAL, FREET4, T3FREE, THYROIDAB in the last 72 hours. Anemia Panel: No results for input(s): VITAMINB12, FOLATE, FERRITIN, TIBC, IRON, RETICCTPCT in the last 72 hours. Sepsis Labs: Recent Labs  Lab 07/13/18 1523  LATICACIDVEN 1.3    No results found for this or any previous visit (from the past 240 hour(s)).       Radiology Studies: Dg Chest 2 View  Result Date: 07/13/2018 CLINICAL DATA:  Cough, congestion EXAM: CHEST - 2 VIEW COMPARISON:  04/22/2014 FINDINGS: There is bilateral diffuse mild interstitial thickening. There is a trace right pleural effusion. There is no left pleural effusion. There is no pneumothorax. There is stable cardiomegaly. There is no acute osseous abnormality. IMPRESSION: Findings most concerning for mild CHF. Electronically Signed   By: Elige Ko   On: 07/13/2018 15:14        Scheduled Meds: . enoxaparin (LOVENOX) injection  40 mg Subcutaneous Q24H  . ezetimibe  10 mg Oral QHS  . furosemide  40 mg Intravenous BID  . simvastatin  40 mg Oral QHS   Continuous Infusions:   LOS: 0 days    Time  spent:36mins    Erick Blinks, MD [facility] Pager [phone]  If 7PM-7AM, please contact night-coverage [email or web address] Password [facility] 07/14/2018, 6:29 PM

## 2018-07-14 NOTE — Progress Notes (Signed)
*  PRELIMINARY RESULTS* Echocardiogram 2D Echocardiogram with DEFINITY has been performed.  Nicole Welch 07/14/2018, 12:01 PM

## 2018-07-15 DIAGNOSIS — J9601 Acute respiratory failure with hypoxia: Secondary | ICD-10-CM

## 2018-07-15 DIAGNOSIS — E785 Hyperlipidemia, unspecified: Secondary | ICD-10-CM

## 2018-07-15 DIAGNOSIS — R931 Abnormal findings on diagnostic imaging of heart and coronary circulation: Secondary | ICD-10-CM

## 2018-07-15 DIAGNOSIS — F419 Anxiety disorder, unspecified: Secondary | ICD-10-CM

## 2018-07-15 DIAGNOSIS — I5031 Acute diastolic (congestive) heart failure: Principal | ICD-10-CM

## 2018-07-15 LAB — CBC
HEMATOCRIT: 57.3 % — AB (ref 36.0–46.0)
Hemoglobin: 17.8 g/dL — ABNORMAL HIGH (ref 12.0–15.0)
MCH: 32.4 pg (ref 26.0–34.0)
MCHC: 31.1 g/dL (ref 30.0–36.0)
MCV: 104.2 fL — AB (ref 80.0–100.0)
NRBC: 0 % (ref 0.0–0.2)
PLATELETS: 292 10*3/uL (ref 150–400)
RBC: 5.5 MIL/uL — ABNORMAL HIGH (ref 3.87–5.11)
RDW: 14.9 % (ref 11.5–15.5)
WBC: 9.5 10*3/uL (ref 4.0–10.5)

## 2018-07-15 LAB — COMPREHENSIVE METABOLIC PANEL
ALT: 28 U/L (ref 0–44)
AST: 32 U/L (ref 15–41)
Albumin: 3.2 g/dL — ABNORMAL LOW (ref 3.5–5.0)
Alkaline Phosphatase: 115 U/L (ref 38–126)
Anion gap: 13 (ref 5–15)
BILIRUBIN TOTAL: 1.4 mg/dL — AB (ref 0.3–1.2)
BUN: 13 mg/dL (ref 8–23)
CHLORIDE: 87 mmol/L — AB (ref 98–111)
CO2: 41 mmol/L — ABNORMAL HIGH (ref 22–32)
CREATININE: 0.69 mg/dL (ref 0.44–1.00)
Calcium: 8.1 mg/dL — ABNORMAL LOW (ref 8.9–10.3)
GFR calc Af Amer: >60 mL/min (ref >60)
GLUCOSE: 94 mg/dL (ref 70–99)
Potassium: 3.5 mmol/L (ref 3.5–5.1)
Sodium: 141 mmol/L (ref 135–145)
Total Protein: 7.2 g/dL (ref 6.5–8.1)

## 2018-07-15 LAB — HIV ANTIBODY (ROUTINE TESTING W REFLEX): HIV Screen 4th Generation wRfx: NONREACTIVE

## 2018-07-15 LAB — URINE CULTURE

## 2018-07-15 MED ORDER — ALPRAZOLAM 1 MG PO TABS
1.0000 mg | ORAL_TABLET | Freq: Three times a day (TID) | ORAL | Status: DC | PRN
  Administered 2018-07-15 – 2018-07-16 (×2): 1 mg via ORAL
  Filled 2018-07-15 (×2): qty 1

## 2018-07-15 NOTE — Care Management Note (Signed)
Case Management Note  Patient Details  Name: PETER DAQUILA MRN: [id number] Date of Birth: 12-03-1955  Subjective/Objective:     New CHF. From home with daughter. Independent. Acutely on oxygen, continuing IV Lasix today, anticipate weaning to room air.  Discussed [facility] CHF program. She is agreeable. Discussed diet needs and need to weigh daily.              Action/Plan: Dc home with home health RN for disease management. Tim of [facility] aware and will obtain order via Epic. Anticipate DC tomorrow 07/15/2018, will need home 02 eval.   Expected Discharge Date:       07/16/2018           Expected Discharge Plan:  Home w Home Health Services  In-House Referral:     Discharge planning Services  CM Consult  Post Acute Care Choice:  Home Health Choice offered to:  Patient  DME Arranged:    DME Agency:     HH Arranged:  RN, Disease Management HH Agency:  [facility] (now [facility])  Status of Service:  In process, will continue to follow  If discussed at Long Length of Stay Meetings, dates discussed:    Additional Comments:  Umaiza Matusik, Chrystine Oiler, RN 07/15/2018, 12:15 PM

## 2018-07-15 NOTE — Progress Notes (Signed)
SATURATION QUALIFICATIONS: (This note is used to comply with regulatory documentation for home oxygen)  Patient Saturations on Room Air at Rest = 94%  Patient Saturations on Room Air while Ambulating = 81%  Patient Saturations on 2 Liters of oxygen while Ambulating = 90%  Please briefly explain why patient needs home oxygen:

## 2018-07-15 NOTE — Progress Notes (Signed)
PROGRESS NOTE    Nicole Welch  ZOX:[id number] DOB: 01/05/1956 DOA: 07/13/2018 PCP: Elfredia Nevins, MD    Brief Narrative:  62 year old female admitted to the hospital with shortness of breath and generalized weakness.  Found to have decompensated CHF exacerbation with mild elevation of troponin felt to be related to demand ischemia.  Admitted for intravenous diuresis.  Cardiology following.   Assessment & Plan:   Principal Problem:   Fluid overload Active Problems:   Tobacco abuse   Anxiety   Acute diastolic CHF (congestive heart failure) (HCC)   Demand ischemia (HCC)   Acute respiratory failure with hypoxia (HCC)   1. Acute diastolic congestive heart failure.  Currently on intravenous Lasix.  Patient is still becoming dyspneic on exertion as well as hypoxic.  Still has some evidence of volume overload.  Continue on intravenous diuretics.  Cardiology following.  Echo shows normal ejection fraction.  Continue current treatments. 2. Acute respiratory failure with hypoxia.  Related to CHF exacerbation.  Continue to wean down oxygen as tolerated. 3. Demand ischemia.  Noted to have mildly elevated troponins.  No complaints of chest pain.  Echocardiogram does show some wall motion abnormalities, unclear if these are old.  Further ischemic work-up per cardiology. 4. Anxiety.  Continue on Xanax as needed. 5. Tobacco abuse 6. Hyperlipidemia.  Continue statin   DVT prophylaxis: Lovenox Code Status: Full code Family Communication: Discussed with daughter at the bedside Disposition Plan: Discharge home once improved   Consultants:   Cardiology  Procedures:  Echo: - Left ventricle: The cavity size was normal. Mild proximal septal   hypertrophy. Systolic function was normal. The estimated ejection   fraction was in the range of 50% to 55%. Doppler parameters are   consistent with abnormal left ventricular relaxation (grade 1   diastolic dysfunction). Doppler parameters are  consistent with   high ventricular filling pressure. - Regional wall motion abnormality: Mild hypokinesis of the apical   anterior, apical inferior, apical septal, and apical myocardium.  - Aortic valve: There was trivial regurgitation.  Antimicrobials:      Subjective: Still short of breath on exertion.  No chest pain.  Objective: Vitals:   07/14/18 2131 07/15/18 0600 07/15/18 0857 07/15/18 1452  BP: 112/75 105/64  (!) 121/54  Pulse: 99 (!) 104  (!) 106  Resp: 18 19  18   Temp: 98.1 F (36.7 C) 98.4 F (36.9 C)  98.3 F (36.8 C)  TempSrc: Oral Oral  Oral  SpO2: 91% 93% 92% 90%  Weight:  87 kg    Height:        Intake/Output Summary (Last 24 hours) at 07/15/2018 1850 Last data filed at 07/15/2018 1428 Gross per 24 hour  Intake 960 ml  Output 2300 ml  Net -1340 ml   Filed Weights   07/13/18 2300 07/14/18 0624 07/15/18 0600  Weight: 89.8 kg 89.8 kg 87 kg    Examination:  General exam: Alert, awake, oriented x 3 Respiratory system: Crackles at bases. Respiratory effort normal. Cardiovascular system:RRR. No murmurs, rubs, gallops. Gastrointestinal system: Abdomen is nondistended, soft and nontender. No organomegaly or masses felt. Normal bowel sounds heard. Central nervous system: Alert and oriented. No focal neurological deficits. Extremities: Lower extremity edema is improving Skin: No rashes, lesions or ulcers Psychiatry: Judgement and insight appear normal. Mood & affect appropriate.      Data Reviewed: I have personally reviewed following labs and imaging studies  CBC: Recent Labs  Lab 07/13/18 1403 07/15/18 0459  WBC 6.8  9.5  NEUTROABS 5.6  --   HGB 16.6* 17.8*  HCT 54.3* 57.3*  MCV 102.1* 104.2*  PLT 308 292   Basic Metabolic Panel: Recent Labs  Lab 07/13/18 1403 07/14/18 0209 07/15/18 0459  NA 133* 136 141  K 4.9 3.9 3.5  CL 92* 93* 87*  CO2 31 35* 41*  GLUCOSE 126* 117* 94  BUN 8 8 13   CREATININE 0.69 0.68 0.69  CALCIUM 8.5* 7.8*  8.1*   GFR: Estimated Creatinine Clearance: 73.7 mL/min (by C-G formula based on SCr of 0.69 mg/dL). Liver Function Tests: Recent Labs  Lab 07/13/18 1403 07/14/18 0209 07/15/18 0459  AST 63* 40 32  ALT 37 32 28  ALKPHOS 127* 116 115  BILITOT 2.1* 1.4* 1.4*  PROT 7.1 6.7 7.2  ALBUMIN 3.2* 3.0* 3.2*   No results for input(s): LIPASE, AMYLASE in the last 168 hours. No results for input(s): AMMONIA in the last 168 hours. Coagulation Profile: No results for input(s): INR, PROTIME in the last 168 hours. Cardiac Enzymes: Recent Labs  Lab 07/13/18 1403 07/13/18 2017 07/14/18 0209  TROPONINI 0.12* 0.13* 0.14*   BNP (last 3 results) No results for input(s): PROBNP in the last 8760 hours. HbA1C: No results for input(s): HGBA1C in the last 72 hours. CBG: No results for input(s): GLUCAP in the last 168 hours. Lipid Profile: No results for input(s): CHOL, HDL, LDLCALC, TRIG, CHOLHDL, LDLDIRECT in the last 72 hours. Thyroid Function Tests: No results for input(s): TSH, T4TOTAL, FREET4, T3FREE, THYROIDAB in the last 72 hours. Anemia Panel: No results for input(s): VITAMINB12, FOLATE, FERRITIN, TIBC, IRON, RETICCTPCT in the last 72 hours. Sepsis Labs: Recent Labs  Lab 07/13/18 1523  LATICACIDVEN 1.3    Recent Results (from the past 240 hour(s))  Urine Culture     Status: Abnormal   Collection Time: 07/13/18  2:04 PM  Result Value Ref Range Status   Specimen Description   Final    URINE, RANDOM Performed at [facility], [address], [place], [place] [address]    Special Requests   Final    NONE Performed at [facility], [address] [place], [place] [address]    Culture (A)  Final    <10,000 COLONIES/mL INSIGNIFICANT GROWTH Performed at [facility], [address], [place], [place] [address]    Report Status 07/15/2018 FINAL  Final         Radiology Studies: No results found.      Scheduled Meds: . enoxaparin (LOVENOX) injection  40 mg  Subcutaneous Q24H  . ezetimibe  10 mg Oral QHS  . furosemide  40 mg Intravenous BID  . simvastatin  40 mg Oral QHS   Continuous Infusions:   LOS: 1 day    Time spent:67mins    Erick Blinks, MD [facility] Pager [phone]  If 7PM-7AM, please contact night-coverage [email or web address] Password [email or web address] 07/15/2018, 6:50 PM

## 2018-07-15 NOTE — Progress Notes (Addendum)
Progress Note  Patient Name: Nicole Welch Date of Encounter: 07/15/2018  Primary Cardiologist: Prentice Docker, MD   Subjective   Reports improvement in her respiratory status but still having oxygen desaturations with walking down the hallway. Denies any chest pain or palpitations.   Inpatient Medications    Scheduled Meds: . enoxaparin (LOVENOX) injection  40 mg Subcutaneous Q24H  . ezetimibe  10 mg Oral QHS  . furosemide  40 mg Intravenous BID  . simvastatin  40 mg Oral QHS   Continuous Infusions:  PRN Meds: acetaminophen **OR** acetaminophen, ALPRAZolam, ipratropium-albuterol, oxyCODONE-acetaminophen, polyethylene glycol   Vital Signs    Vitals:   07/14/18 1313 07/14/18 2131 07/15/18 0600 07/15/18 0857  BP: (!) 102/42 112/75 105/64   Pulse: (!) 52 99 (!) 104   Resp: 20 18 19    Temp: 98.4 F (36.9 C) 98.1 F (36.7 C) 98.4 F (36.9 C)   TempSrc: Oral Oral Oral   SpO2: 90% 91% 93% 92%  Weight:   87 kg   Height:        Intake/Output Summary (Last 24 hours) at 07/15/2018 1004 Last data filed at 07/15/2018 0800 Gross per 24 hour  Intake 1320 ml  Output 3250 ml  Net -1930 ml   Filed Weights   07/13/18 2300 07/14/18 0624 07/15/18 0600  Weight: 89.8 kg 89.8 kg 87 kg    Telemetry    Sinus tachycardia, HR in 90's to low-100's with occasional PVC's - Personally Reviewed  ECG    No new tracings.   Physical Exam   General: Well developed, well nourished Caucasian female appearing in no acute distress. Head: Normocephalic, atraumatic.  Neck: Supple without bruits, JVD at 9cm. Lungs:  Resp regular and unlabored, rales along bases bilaterally. Heart: RRR, S1, S2, no S3, S4, or murmur; no rub. Abdomen: Soft, non-tender, non-distended with normoactive bowel sounds. No hepatomegaly. No rebound/guarding. No obvious abdominal masses. Extremities: No clubbing or cyanosis, trace lower extremity edema . Distal pedal pulses are 2+ bilaterally. Neuro: Alert  and oriented X 3. Moves all extremities spontaneously. Psych: Normal affect.  Labs    Chemistry Recent Labs  Lab 07/13/18 1403 07/14/18 0209 07/15/18 0459  NA 133* 136 141  K 4.9 3.9 3.5  CL 92* 93* 87*  CO2 31 35* 41*  GLUCOSE 126* 117* 94  BUN 8 8 13   CREATININE 0.69 0.68 0.69  CALCIUM 8.5* 7.8* 8.1*  PROT 7.1 6.7 7.2  ALBUMIN 3.2* 3.0* 3.2*  AST 63* 40 32  ALT 37 32 28  ALKPHOS 127* 116 115  BILITOT 2.1* 1.4* 1.4*  GFRNONAA >60 >60 >60  GFRAA >60 >60 >60  ANIONGAP 10 8 13      Hematology Recent Labs  Lab 07/13/18 1403 07/15/18 0459  WBC 6.8 9.5  RBC 5.32* 5.50*  HGB 16.6* 17.8*  HCT 54.3* 57.3*  MCV 102.1* 104.2*  MCH 31.2 32.4  MCHC 30.6 31.1  RDW 15.0 14.9  PLT 308 292    Cardiac Enzymes Recent Labs  Lab 07/13/18 1403 07/13/18 2017 07/14/18 0209  TROPONINI 0.12* 0.13* 0.14*   No results for input(s): TROPIPOC in the last 168 hours.   BNP Recent Labs  Lab 07/13/18 1403  BNP 1,970.0*     DDimer No results for input(s): DDIMER in the last 168 hours.   Radiology    Dg Chest 2 View  Result Date: 07/13/2018 CLINICAL DATA:  Cough, congestion EXAM: CHEST - 2 VIEW COMPARISON:  04/22/2014 FINDINGS: There is bilateral diffuse  mild interstitial thickening. There is a trace right pleural effusion. There is no left pleural effusion. There is no pneumothorax. There is stable cardiomegaly. There is no acute osseous abnormality. IMPRESSION: Findings most concerning for mild CHF. Electronically Signed   By: Elige Ko   On: 07/13/2018 15:14    Cardiac Studies   Echocardiogram: 07/14/2018  Study Conclusions  - Procedure narrative: Transthoracic echocardiography. Image   quality was adequate. Intravenous contrast (Definity) was   administered. - Left ventricle: The cavity size was normal. Mild proximal septal   hypertrophy. Systolic function was normal. The estimated ejection   fraction was in the range of 50% to 55%. Doppler parameters are    consistent with abnormal left ventricular relaxation (grade 1   diastolic dysfunction). Doppler parameters are consistent with   high ventricular filling pressure. - Regional wall motion abnormality: Mild hypokinesis of the apical   anterior, apical inferior, apical septal, and apical myocardium. - Aortic valve: There was trivial regurgitation.  Impressions:  - Apical endocardial hypokinesis suggestive of left anterior   descending coronary artery disease.   Patient Profile     62 y.o. female w/ PMH of HLD, anxiety, chronic back pain, tobacco use and family history of CAD who presented to [facility] ED on 07/13/2018 for evaluation of weakness, malaise, and dyspnea. Found to have an elevated BNP of 1970 and minimally elevated troponin values (0.12 --> 0.13 --> 0.14). Cardiology consulted for further management.   Assessment & Plan      1. Acute Diastolic CHF Exacerbation  - presented with mixed symptoms of nasal discharge, sinus congestion and generalized weakness. Was initially hypoxic at 85% on RA upon admission and BNP elevated to 1970. - echocardiogram shows a preserved EF of 50-55% with Grade 1 DD and mild apical hypokinesis as outlined above. - was started on IV Lasix 40mg  BID and while I&O's have been inaccurate, she has experienced multiple urine occurrences and weight has declined by 6 lbs (197 --> 191 lbs). Still has rales on examination and oxygen desaturations when walking, therefore would continue with IV Lasix today. Creatinine has remained stable. Once compensated, consider addition of low-dose BB prior to discharge given her low-normal EF and likely CAD.   2. Elevated Troponin  - she denies any recent chest pain or dyspnea on exertion. Cyclic values have been flat at 0.12, 0.13 and 0.14 which is most consistent with demand ischemia in the setting of acute volume overload. EKG shows NSR, HR 95, with anterior Q-waves and nonspecific IVCD (no prior tracings available for  comparison). - echocardiogram shows a low-normal EF of 50% to 55% and mild hypokinesis of the apical anterior, apical inferior, apical septal, and apical myocardium suggestive of LAD disease. She has denied any anginal symptoms but does have multiple cardiac risk factors. Will need a Lexiscan Myoview to assess for ischemia once her respiratory status improves. Will discuss with Dr. Purvis Sheffield in regards to inpatient versus outpatient evaluation.   3. HLD -Followed by PCP as an outpatient. Continue PTA Zetia 10 mg daily and Simvastatin 40 mg daily.  4. Tobacco Use - she has a 40+ pack-year history. Cessation was advised.     For questions or updates, please contact [facility] Please consult [email or web address] for contact info under Cardiology/STEMI.   Signed, Ellsworth Lennox , PA-C 10:04 AM 07/15/2018 Pager: [phone]  The patient was seen and examined, and I agree with the history, physical exam, assessment and plan as documented above, with modifications as noted  below.  Overall she is feeling somewhat better today but she said she is anxious.  She has a history of panic attacks and normally takes Xanax.  Nursing notes comment that she has been missing the hat when urinating and thus output amounts are inaccurate.  She denies chest pain and palpitations.  Echocardiogram reviewed above with overall normal left ventricular systolic function with wall motion abnormality suggestive of LAD disease.  She does have desaturations into the 80% range with exertion.  Continue IV Lasix.  Renal function is stable.  Troponin elevations consistent with demand ischemia in the context of decompensated diastolic heart failure.  I would recommend an outpatient nuclear stress test to evaluate for ischemic heart disease.  She is on simvastatin and ezetimibe.  I would recommend low-dose aspirin and beta-blocker at the time of discharge given suspected coronary artery disease.   Prentice Docker, MD, [facility]  07/15/2018 10:15 AM

## 2018-07-16 LAB — BASIC METABOLIC PANEL
ANION GAP: 14 (ref 5–15)
BUN: 13 mg/dL (ref 8–23)
CALCIUM: 7.6 mg/dL — AB (ref 8.9–10.3)
CO2: 42 mmol/L — ABNORMAL HIGH (ref 22–32)
Chloride: 83 mmol/L — ABNORMAL LOW (ref 98–111)
Creatinine, Ser: 0.57 mg/dL (ref 0.44–1.00)
GLUCOSE: 92 mg/dL (ref 70–99)
POTASSIUM: 2.9 mmol/L — AB (ref 3.5–5.1)
Sodium: 139 mmol/L (ref 135–145)

## 2018-07-16 MED ORDER — SALINE SPRAY 0.65 % NA SOLN
1.0000 | NASAL | Status: DC | PRN
  Administered 2018-07-16: 1 via NASAL
  Filled 2018-07-16: qty 44

## 2018-07-16 MED ORDER — POTASSIUM CHLORIDE 20 MEQ PO PACK
40.0000 meq | PACK | Freq: Two times a day (BID) | ORAL | Status: DC
  Administered 2018-07-16 – 2018-07-18 (×5): 40 meq via ORAL
  Filled 2018-07-16 (×5): qty 2

## 2018-07-16 MED ORDER — FUROSEMIDE 40 MG PO TABS
40.0000 mg | ORAL_TABLET | Freq: Every day | ORAL | Status: DC
  Administered 2018-07-17: 40 mg via ORAL
  Filled 2018-07-16: qty 1

## 2018-07-16 MED ORDER — ALPRAZOLAM 0.5 MG PO TABS: 0.5000 mg | ORAL_TABLET | Freq: Every evening | ORAL | Status: DC | PRN

## 2018-07-16 NOTE — Progress Notes (Signed)
PROGRESS NOTE    Nicole Welch  ZOX:[id number] DOB: 04-Apr-1956 DOA: 07/13/2018 PCP: Elfredia Nevins, MD   Brief Narrative:  62 year old female admitted to the hospital with shortness of breath and generalized weakness.  Found to have decompensated CHF exacerbation with mild elevation of troponin felt to be related to demand ischemia.  Admitted for intravenous diuresis.  Cardiology following with plans to decrease Lasix dose today and supplement potassium.   Assessment & Plan:   Principal Problem:   Fluid overload Active Problems:   Tobacco abuse   Anxiety   Acute diastolic CHF (congestive heart failure) (HCC)   Demand ischemia (HCC)   Acute respiratory failure with hypoxia (HCC)   1. Acute diastolic congestive heart failure-improving.    IV Lasix to p.o. today. Patient is still becoming dyspneic on exertion as well as hypoxic and will work on weaning oxygen.  Echo shows normal ejection fraction.  Continue current treatments and appreciate cardiology recommendations. 2. Acute respiratory failure with hypoxia.  Related to CHF exacerbation.  Continue to wean down oxygen as tolerated today.  Ambulate by tomorrow to see if ready for discharge. 3. Hypokalemia.  Potassium replacement ordered by cardiology and will recheck labs in a.m. along with magnesium. 4. Demand ischemia.  Noted to have mildly elevated troponins.  No complaints of chest pain.  Echocardiogram does show some wall motion abnormalities, unclear if these are old.  Further ischemic work-up per cardiology. 5. Anxiety.  Continue on Xanax as needed. 6. Tobacco abuse 7. Hyperlipidemia.  Continue statin   DVT prophylaxis: Lovenox Code Status: Full code Family Communication: Discussed with daughter at the bedside on 10/24 Disposition Plan: Discharge home once improved, likely by a.m.   Consultants:   Cardiology  Procedures:  Echo: - Left ventricle: The cavity size was normal. Mild proximal  septal hypertrophy. Systolic function was normal. The estimated ejection fraction was in the range of 50% to 55%. Doppler parameters are consistent with abnormal left ventricular relaxation (grade 1 diastolic dysfunction). Doppler parameters are consistent with high ventricular filling pressure. - Regional wall motion abnormality: Mild hypokinesis of the apical anterior, apical inferior, apical septal, and apical myocardium.  - Aortic valve: There was trivial regurgitation.  Antimicrobials:   None  Subjective: Patient seen and evaluated today with no new acute complaints or concerns. No acute concerns or events noted overnight.  She appears to be improving with less shortness of breath noted today.  Objective: Vitals:   07/15/18 1452 07/15/18 2102 07/16/18 0500 07/16/18 0807  BP: (!) 121/54 (!) 110/53 (!) 104/56   Pulse: (!) 106 (!) 101    Resp: 18     Temp: 98.3 F (36.8 C)     TempSrc: Oral     SpO2: 90% 91%  91%  Weight:   85.8 kg   Height:        Intake/Output Summary (Last 24 hours) at 07/16/2018 1031 Last data filed at 07/16/2018 1013 Gross per 24 hour  Intake 480 ml  Output 1100 ml  Net -620 ml   Filed Weights   07/14/18 0624 07/15/18 0600 07/16/18 0500  Weight: 89.8 kg 87 kg 85.8 kg    Examination:  General exam: Appears calm and comfortable  Respiratory system: Clear to auscultation. Respiratory effort normal.  Currently on 3 L nasal cannula. Cardiovascular system: S1 & S2 heard, RRR. No JVD, murmurs, rubs, gallops or clicks. No pedal edema. Gastrointestinal system: Abdomen is nondistended, soft and nontender. No organomegaly or masses felt. Normal bowel sounds heard.  Central nervous system: Alert and oriented. No focal neurological deficits. Extremities: Symmetric 5 x 5 power. Skin: No rashes, lesions or ulcers Psychiatry: Judgement and insight appear normal. Mood & affect appropriate.     Data Reviewed: I have personally reviewed  following labs and imaging studies  CBC: Recent Labs  Lab 07/13/18 1403 07/15/18 0459  WBC 6.8 9.5  NEUTROABS 5.6  --   HGB 16.6* 17.8*  HCT 54.3* 57.3*  MCV 102.1* 104.2*  PLT 308 292   Basic Metabolic Panel: Recent Labs  Lab 07/13/18 1403 07/14/18 0209 07/15/18 0459 07/16/18 0423  NA 133* 136 141 139  K 4.9 3.9 3.5 2.9*  CL 92* 93* 87* 83*  CO2 31 35* 41* 42*  GLUCOSE 126* 117* 94 92  BUN 8 8 13 13   CREATININE 0.69 0.68 0.69 0.57  CALCIUM 8.5* 7.8* 8.1* 7.6*   GFR: Estimated Creatinine Clearance: 73.2 mL/min (by C-G formula based on SCr of 0.57 mg/dL). Liver Function Tests: Recent Labs  Lab 07/13/18 1403 07/14/18 0209 07/15/18 0459  AST 63* 40 32  ALT 37 32 28  ALKPHOS 127* 116 115  BILITOT 2.1* 1.4* 1.4*  PROT 7.1 6.7 7.2  ALBUMIN 3.2* 3.0* 3.2*   No results for input(s): LIPASE, AMYLASE in the last 168 hours. No results for input(s): AMMONIA in the last 168 hours. Coagulation Profile: No results for input(s): INR, PROTIME in the last 168 hours. Cardiac Enzymes: Recent Labs  Lab 07/13/18 1403 07/13/18 2017 07/14/18 0209  TROPONINI 0.12* 0.13* 0.14*   BNP (last 3 results) No results for input(s): PROBNP in the last 8760 hours. HbA1C: No results for input(s): HGBA1C in the last 72 hours. CBG: No results for input(s): GLUCAP in the last 168 hours. Lipid Profile: No results for input(s): CHOL, HDL, LDLCALC, TRIG, CHOLHDL, LDLDIRECT in the last 72 hours. Thyroid Function Tests: No results for input(s): TSH, T4TOTAL, FREET4, T3FREE, THYROIDAB in the last 72 hours. Anemia Panel: No results for input(s): VITAMINB12, FOLATE, FERRITIN, TIBC, IRON, RETICCTPCT in the last 72 hours. Sepsis Labs: Recent Labs  Lab 07/13/18 1523  LATICACIDVEN 1.3    Recent Results (from the past 240 hour(s))  Urine Culture     Status: Abnormal   Collection Time: 07/13/18  2:04 PM  Result Value Ref Range Status   Specimen Description   Final    URINE,  RANDOM Performed at [facility], [address], [place], [place] [address]    Special Requests   Final    NONE Performed at [facility], [address], [place], [place] [address]    Culture (A)  Final    <10,000 COLONIES/mL INSIGNIFICANT GROWTH Performed at [facility], [address] [place], [place] [address]    Report Status 07/15/2018 FINAL  Final         Radiology Studies: No results found.      Scheduled Meds: . enoxaparin (LOVENOX) injection  40 mg Subcutaneous Q24H  . ezetimibe  10 mg Oral QHS  . [START ON 07/17/2018] furosemide  40 mg Oral Daily  . potassium chloride  40 mEq Oral BID  . simvastatin  40 mg Oral QHS   Continuous Infusions:   LOS: 2 days    Time spent: 30 minutes    Fread Kottke Hoover Brunette, DO [facility] Pager [phone]  If 7PM-7AM, please contact night-coverage [email or web address] Password [email or web address] 07/16/2018, 10:31 AM

## 2018-07-16 NOTE — Care Management Important Message (Signed)
Important Message  Patient Details  Name: Nicole Welch MRN: [id number] Date of Birth: 06/25/1956   Medicare Important Message Given:  Yes    Renie Ora 07/16/2018, 10:11 AM

## 2018-07-16 NOTE — Progress Notes (Deleted)
Patient discharged home with personal belongings and prescriptions. IV removed and site intact. Pt has appointment today with [facility] at 3:15 PM for them to place her 30 day monitor.

## 2018-07-16 NOTE — Progress Notes (Signed)
Progress Note  Patient Name: Nicole Welch Date of Encounter: 07/16/2018  Primary Cardiologist: Prentice Docker, MD   Subjective   She is feeling better this morning.  She denies chest pain and palpitations.  Inpatient Medications    Scheduled Meds: . enoxaparin (LOVENOX) injection  40 mg Subcutaneous Q24H  . ezetimibe  10 mg Oral QHS  . furosemide  40 mg Intravenous BID  . potassium chloride  40 mEq Oral BID  . simvastatin  40 mg Oral QHS   Continuous Infusions:  PRN Meds: acetaminophen **OR** acetaminophen, ALPRAZolam, ipratropium-albuterol, oxyCODONE-acetaminophen, polyethylene glycol   Vital Signs    Vitals:   07/15/18 1452 07/15/18 2102 07/16/18 0500 07/16/18 0807  BP: (!) 121/54 (!) 110/53 (!) 104/56   Pulse: (!) 106 (!) 101    Resp: 18     Temp: 98.3 F (36.8 C)     TempSrc: Oral     SpO2: 90% 91%  91%  Weight:   85.8 kg   Height:        Intake/Output Summary (Last 24 hours) at 07/16/2018 0833 Last data filed at 07/15/2018 2000 Gross per 24 hour  Intake 240 ml  Output 1700 ml  Net -1460 ml   Filed Weights   07/14/18 0624 07/15/18 0600 07/16/18 0500  Weight: 89.8 kg 87 kg 85.8 kg    Telemetry    Sinus tachycardia in low 100 bpm range, 12 beat run of SVT- Personally Reviewed  ECG    No new tracings- Personally Reviewed  Physical Exam   GEN: No acute distress.   Neck: No JVD Cardiac:  Mildly tachycardic, regular rhythm, no murmurs, rubs, or gallops.  Respiratory:  Faint crackles at bases bilaterally. GI: Soft, nontender, non-distended  MS: No edema; No deformity. Neuro:  Nonfocal  Psych: Normal affect   Labs    Chemistry Recent Labs  Lab 07/13/18 1403 07/14/18 0209 07/15/18 0459 07/16/18 0423  NA 133* 136 141 139  K 4.9 3.9 3.5 2.9*  CL 92* 93* 87* 83*  CO2 31 35* 41* 42*  GLUCOSE 126* 117* 94 92  BUN 8 8 13 13   CREATININE 0.69 0.68 0.69 0.57  CALCIUM 8.5* 7.8* 8.1* 7.6*  PROT 7.1 6.7 7.2  --   ALBUMIN 3.2* 3.0*  3.2*  --   AST 63* 40 32  --   ALT 37 32 28  --   ALKPHOS 127* 116 115  --   BILITOT 2.1* 1.4* 1.4*  --   GFRNONAA >60 >60 >60 >60  GFRAA >60 >60 >60 >60  ANIONGAP 10 8 13 14      Hematology Recent Labs  Lab 07/13/18 1403 07/15/18 0459  WBC 6.8 9.5  RBC 5.32* 5.50*  HGB 16.6* 17.8*  HCT 54.3* 57.3*  MCV 102.1* 104.2*  MCH 31.2 32.4  MCHC 30.6 31.1  RDW 15.0 14.9  PLT 308 292    Cardiac Enzymes Recent Labs  Lab 07/13/18 1403 07/13/18 2017 07/14/18 0209  TROPONINI 0.12* 0.13* 0.14*   No results for input(s): TROPIPOC in the last 168 hours.   BNP Recent Labs  Lab 07/13/18 1403  BNP 1,970.0*     DDimer No results for input(s): DDIMER in the last 168 hours.   Radiology    No results found.  Cardiac Studies   Echocardiogram: 07/14/2018  Study Conclusions  - Procedure narrative: Transthoracic echocardiography. Image quality was adequate. Intravenous contrast (Definity) was administered. - Left ventricle: The cavity size was normal. Mild proximal septal hypertrophy. Systolic  function was normal. The estimated ejection fraction was in the range of 50% to 55%. Doppler parameters are consistent with abnormal left ventricular relaxation (grade 1 diastolic dysfunction). Doppler parameters are consistent with high ventricular filling pressure. - Regional wall motion abnormality: Mild hypokinesis of the apical anterior, apical inferior, apical septal, and apical myocardium. - Aortic valve: There was trivial regurgitation.  Impressions:  - Apical endocardial hypokinesis suggestive of left anterior descending coronary artery disease.  Patient Profile     62 y.o. female w/ PMH of HLD, anxiety, chronic back pain, tobacco use and family history of CADwho presented to [facility] on 07/13/2018 for evaluation of weakness, malaise, and dyspnea. Found to have an elevated BNP of 1970 and minimally elevated troponin values (0.12 --> 0.13 -->  0.14). Cardiology consulted for further management.   Assessment & Plan    1.  Acute diastolic heart failure: Weight down 1.2 kg from yesterday.  Breathing has improved.  She has put out an additional 1.22 L in the past 24 hours.  I would recommend she be ambulated again today to assess O2 saturations.  I will discontinue IV Lasix and start oral Lasix 40 mg daily.  She is hypokalemic at 2.9 so I will begin potassium supplementation.  Blood pressure is low normal.  I would consider the addition of low-dose beta-blocker prior to discharge given probable coronary artery disease.  2.  Elevated troponin: Denies anginal symptoms with flat troponin values consistent with demand ischemia in the setting of acute decompensated diastolic heart failure.  Echocardiogram with apical wall motion abnormality suggestive of coronary artery disease.  I would recommend an outpatient Lexiscan Myoview.  Aspirin 81 mg daily can be started prior to discharge.  3.  Hyperlipidemia: Continue Zetia 10 mg and simvastatin 40 mg.  4.  Tobacco abuse: She has a 40+-pack-year history.  Cessation was advised.    For questions or updates, please contact [facility] Please consult [email or web address] for contact info under Cardiology/STEMI.      Signed, Prentice Docker, MD  07/16/2018, 8:33 AM

## 2018-07-17 LAB — BASIC METABOLIC PANEL
ANION GAP: 10 (ref 5–15)
BUN: 11 mg/dL (ref 8–23)
CHLORIDE: 88 mmol/L — AB (ref 98–111)
CO2: 39 mmol/L — ABNORMAL HIGH (ref 22–32)
Calcium: 8.2 mg/dL — ABNORMAL LOW (ref 8.9–10.3)
Creatinine, Ser: 0.51 mg/dL (ref 0.44–1.00)
GFR calc Af Amer: >60 mL/min (ref >60)
GLUCOSE: 98 mg/dL (ref 70–99)
POTASSIUM: 3.4 mmol/L — AB (ref 3.5–5.1)
Sodium: 137 mmol/L (ref 135–145)

## 2018-07-17 LAB — MAGNESIUM: Magnesium: 1.9 mg/dL (ref 1.7–2.4)

## 2018-07-17 MED ORDER — FUROSEMIDE 10 MG/ML IJ SOLN
40.0000 mg | Freq: Two times a day (BID) | INTRAMUSCULAR | Status: DC
  Administered 2018-07-17 – 2018-07-18 (×4): 40 mg via INTRAVENOUS
  Filled 2018-07-17 (×5): qty 4

## 2018-07-17 MED ORDER — ALPRAZOLAM 0.25 MG PO TABS
0.2500 mg | ORAL_TABLET | Freq: Three times a day (TID) | ORAL | Status: DC | PRN
  Administered 2018-07-17 – 2018-07-23 (×13): 0.25 mg via ORAL
  Filled 2018-07-17 (×14): qty 1

## 2018-07-17 NOTE — Progress Notes (Signed)
PROGRESS NOTE    Nicole Welch  ZOX:[id number] DOB: August 12, 1956 DOA: 07/13/2018 PCP: Elfredia Nevins, MD   Brief Narrative:   62 year old female admitted to the hospital with shortness of breath and generalized weakness. Found to have decompensated CHF exacerbation with mild elevation of troponin felt to be related to demand ischemia. Admitted for intravenous diuresis. Cardiology following with plans to decrease Lasix dose today and supplement potassium.   Assessment & Plan:  Principal Problem: Fluid overload Active Problems: Tobacco abuse Anxiety Acute diastolic CHF (congestive heart failure) (HCC) Demand ischemia (HCC) Acute respiratory failure with hypoxia (HCC)   1. Acute diastolic congestive heart failure- persistent.   IV Lasix 40 mg twice daily once again today as she continues to have significant volume overload-potassium levels have improved with supplementation which will be continued.Patient is still becoming dyspneic on exertion as well as hypoxic and will work on weaning oxygen. Echo shows normal ejection fraction. Continue current treatments and appreciate cardiology recommendations. 2. Acute respiratory failure with hypoxia-persistent. Related to CHF exacerbation. Continue to wean down oxygen as tolerated today.   Patient ambulated today and continues to have significant hypoxemia and symptomatology. 3. Hypokalemia-improving.  Potassium replacement to continue through today with aggressive diuresis and will recheck magnesium in a.m. 4. Demand ischemia. Noted to have mildly elevated troponins. No complaints of chest pain. Echocardiogram does show some wall motion abnormalities, unclear if these are old. Further ischemic work-up per cardiology. 5. Anxiety. Continue on Xanax as needed, currently does cut to half due to some somnolence. 6. Tobacco abuse 7. Hyperlipidemia. Continue statin   DVT prophylaxis:Lovenox Code Status:Full  code Family Communication:Discussed with daughter at the bedside on 10/24 Disposition Plan:Discharge home once improved, likely by a.m.   Consultants:  Cardiology  Procedures: Echo: - Left ventricle: The cavity size was normal. Mild proximal septal hypertrophy. Systolic function was normal. The estimated ejection fraction was in the range of 50% to 55%. Doppler parameters are consistent with abnormal left ventricular relaxation (grade 1 diastolic dysfunction). Doppler parameters are consistent with high ventricular filling pressure. - Regional wall motion abnormality: Mild hypokinesis of the apical anterior, apical inferior, apical septal, and apical myocardium.  - Aortic valve: There was trivial regurgitation.  Antimicrobials:   None  Subjective: Patient seen and evaluated today with no new acute complaints or concerns. No acute concerns or events noted overnight.  She has ongoing shortness of breath particularly with ambulation and continues to remain on nasal cannula.  Objective: Vitals:   07/16/18 0807 07/16/18 1444 07/16/18 2155 07/17/18 0508  BP:  (!) 120/59 125/68 117/63  Pulse:  90 (!) 101 (!) 105  Resp:  18 18   Temp:  98.7 F (37.1 C) 98.3 F (36.8 C) 98 F (36.7 C)  TempSrc:  Oral Oral Oral  SpO2: 91% 91% 97% 93%  Weight:    84.6 kg  Height:        Intake/Output Summary (Last 24 hours) at 07/17/2018 1250 Last data filed at 07/17/2018 0900 Gross per 24 hour  Intake 720 ml  Output 400 ml  Net 320 ml   Filed Weights   07/15/18 0600 07/16/18 0500 07/17/18 0508  Weight: 87 kg 85.8 kg 84.6 kg    Examination:  General exam: Appears calm and comfortable  Respiratory system: Clear to auscultation. Respiratory effort normal. [place] 3L. Cardiovascular system: S1 & S2 heard, RRR. No JVD, murmurs, rubs, gallops or clicks. No pedal edema. Gastrointestinal system: Abdomen is nondistended, soft and nontender. No organomegaly or masses  felt.  Normal bowel sounds heard. Central nervous system: Alert and oriented. No focal neurological deficits. Extremities: Symmetric 5 x 5 power. Skin: No rashes, lesions or ulcers Psychiatry: Judgement and insight appear normal. Mood & affect appropriate.     Data Reviewed: I have personally reviewed following labs and imaging studies  CBC: Recent Labs  Lab 07/13/18 1403 07/15/18 0459  WBC 6.8 9.5  NEUTROABS 5.6  --   HGB 16.6* 17.8*  HCT 54.3* 57.3*  MCV 102.1* 104.2*  PLT 308 292   Basic Metabolic Panel: Recent Labs  Lab 07/13/18 1403 07/14/18 0209 07/15/18 0459 07/16/18 0423 07/17/18 0603  NA 133* 136 141 139 137  K 4.9 3.9 3.5 2.9* 3.4*  CL 92* 93* 87* 83* 88*  CO2 31 35* 41* 42* 39*  GLUCOSE 126* 117* 94 92 98  BUN 8 8 13 13 11   CREATININE 0.69 0.68 0.69 0.57 0.51  CALCIUM 8.5* 7.8* 8.1* 7.6* 8.2*  MG  --   --   --   --  1.9   GFR: Estimated Creatinine Clearance: 72.6 mL/min (by C-G formula based on SCr of 0.51 mg/dL). Liver Function Tests: Recent Labs  Lab 07/13/18 1403 07/14/18 0209 07/15/18 0459  AST 63* 40 32  ALT 37 32 28  ALKPHOS 127* 116 115  BILITOT 2.1* 1.4* 1.4*  PROT 7.1 6.7 7.2  ALBUMIN 3.2* 3.0* 3.2*   No results for input(s): LIPASE, AMYLASE in the last 168 hours. No results for input(s): AMMONIA in the last 168 hours. Coagulation Profile: No results for input(s): INR, PROTIME in the last 168 hours. Cardiac Enzymes: Recent Labs  Lab 07/13/18 1403 07/13/18 2017 07/14/18 0209  TROPONINI 0.12* 0.13* 0.14*   BNP (last 3 results) No results for input(s): PROBNP in the last 8760 hours. HbA1C: No results for input(s): HGBA1C in the last 72 hours. CBG: No results for input(s): GLUCAP in the last 168 hours. Lipid Profile: No results for input(s): CHOL, HDL, LDLCALC, TRIG, CHOLHDL, LDLDIRECT in the last 72 hours. Thyroid Function Tests: No results for input(s): TSH, T4TOTAL, FREET4, T3FREE, THYROIDAB in the last 72 hours. Anemia  Panel: No results for input(s): VITAMINB12, FOLATE, FERRITIN, TIBC, IRON, RETICCTPCT in the last 72 hours. Sepsis Labs: Recent Labs  Lab 07/13/18 1523  LATICACIDVEN 1.3    Recent Results (from the past 240 hour(s))  Urine Culture     Status: Abnormal   Collection Time: 07/13/18  2:04 PM  Result Value Ref Range Status   Specimen Description   Final    URINE, RANDOM Performed at [facility], [address], [place], [place] [address]    Special Requests   Final    NONE Performed at [facility], [address], [place], [place] [address]    Culture (A)  Final    <10,000 COLONIES/mL INSIGNIFICANT GROWTH Performed at [facility], [address], [place], [place] [address]    Report Status 07/15/2018 FINAL  Final         Radiology Studies: No results found.      Scheduled Meds: . enoxaparin (LOVENOX) injection  40 mg Subcutaneous Q24H  . ezetimibe  10 mg Oral QHS  . furosemide  40 mg Intravenous Q12H  . potassium chloride  40 mEq Oral BID  . simvastatin  40 mg Oral QHS   Continuous Infusions:   LOS: 3 days    Time spent: 30 minutes    Merdith Boyd Hoover Brunette, DO [facility] Pager [phone]  If 7PM-7AM,  please contact night-coverage [email or web address] Password [email or web address] 07/17/2018, 12:50 PM

## 2018-07-17 NOTE — Progress Notes (Signed)
SATURATION QUALIFICATIONS: (This note is used to comply with regulatory documentation for home oxygen)  Patient Saturations on Room Air at Rest = 93%  Patient Saturations on Room Air while Ambulating = 82%  Patient Saturations on 3 Liters of oxygen while Ambulating = 91%  Please briefly explain why patient needs home oxygen: Pt unable to tolerate ambulation without significant shortness of breath and O2 desaturation.

## 2018-07-18 LAB — BASIC METABOLIC PANEL
Anion gap: 11 (ref 5–15)
BUN: 15 mg/dL (ref 8–23)
CALCIUM: 8.6 mg/dL — AB (ref 8.9–10.3)
CO2: 38 mmol/L — ABNORMAL HIGH (ref 22–32)
Chloride: 87 mmol/L — ABNORMAL LOW (ref 98–111)
Creatinine, Ser: 0.51 mg/dL (ref 0.44–1.00)
GFR calc Af Amer: >60 mL/min (ref >60)
GLUCOSE: 94 mg/dL (ref 70–99)
Potassium: 4.1 mmol/L (ref 3.5–5.1)
Sodium: 136 mmol/L (ref 135–145)

## 2018-07-18 LAB — MAGNESIUM: Magnesium: 1.8 mg/dL (ref 1.7–2.4)

## 2018-07-18 MED ORDER — POTASSIUM CHLORIDE 20 MEQ PO PACK
40.0000 meq | PACK | Freq: Every day | ORAL | Status: DC
  Administered 2018-07-19 – 2018-07-22 (×4): 40 meq via ORAL
  Filled 2018-07-18 (×5): qty 2

## 2018-07-18 NOTE — Progress Notes (Signed)
PROGRESS NOTE    Nicole Welch  ZOX:[id number] DOB: May 30, 1956 DOA: 07/13/2018 PCP: Elfredia Nevins, MD   Brief Narrative:   62 year old female admitted to the hospital with shortness of breath and generalized weakness. Found to have decompensated CHF exacerbation with mild elevation of troponin felt to be related to demand ischemia. Admitted for intravenous diuresis that is still ongoing with slow response and minimal improvement.   Assessment & Plan:  Principal Problem: Fluid overload Active Problems: Tobacco abuse Anxiety Acute diastolic CHF (congestive heart failure) (HCC) Demand ischemia (HCC) Acute respiratory failure with hypoxia (HCC)   1. Acute diastolic congestive heart failure- persistent.IV Lasix 40 mg twice daily to continue through today as patient is still requiring 3 L nasal cannula at rest.  We will plan to wean oxygen further as tolerated.  She was +160 mL yesterday, however patient does not tend to collect all of her urine as well and this is inaccurate.  We will continue to monitor strict I's and O's on Lasix currently. 2. Acute respiratory failure with hypoxia-persistent. Related to CHF exacerbation. Continue to wean down oxygen as toleratedtoday.  Patient ambulated today and continues to have significant hypoxemia and symptomatology. 3. Hypokalemia- resolved.  Recheck levels in a.m. and reduce potassium supplementation once daily. 4. Demand ischemia. Noted to have mildly elevated troponins. No complaints of chest pain. Echocardiogram does show some wall motion abnormalities, unclear if these are old. Further ischemic work-up per cardiology. 5. Anxiety. Continue on Xanax as needed, currently does cut to half due to some somnolence.  Currently on 0.25 mg 3 times daily as needed anxiety. 6. Tobacco abuse.  Counseled on cessation. 7. Hyperlipidemia. Continue statin   DVT prophylaxis:Lovenox Code Status:Full code Family  Communication:Discussed with daughter at the bedsideon 10/24 Disposition Plan:Discharge home once improved, hopefully next 48 hours.   Consultants:  Cardiology  Procedures: Echo: - Left ventricle: The cavity size was normal. Mild proximal septal hypertrophy. Systolic function was normal. The estimated ejection fraction was in the range of 50% to 55%. Doppler parameters are consistent with abnormal left ventricular relaxation (grade 1 diastolic dysfunction). Doppler parameters are consistent with high ventricular filling pressure. - Regional wall motion abnormality: Mild hypokinesis of the apical anterior, apical inferior, apical septal, and apical myocardium.  - Aortic valve: There was trivial regurgitation.  Antimicrobials:   None  Subjective: Patient seen and evaluated today with no new acute complaints or concerns. No acute concerns or events noted overnight.  She continues to remain short of breath and currently on 3 L nasal cannula.  Objective: Vitals:   07/17/18 0508 07/17/18 1442 07/17/18 2208 07/18/18 0706  BP: 117/63 129/68 119/68 (!) 109/54  Pulse: (!) 105 (!) 107 99 (!) 103  Resp:  18 18 17   Temp: 98 F (36.7 C) 98.6 F (37 C) 98.6 F (37 C) 98.8 F (37.1 C)  TempSrc: Oral Oral Oral Oral  SpO2: 93% 92% 95% 93%  Weight: 84.6 kg   83.4 kg  Height:        Intake/Output Summary (Last 24 hours) at 07/18/2018 1230 Last data filed at 07/18/2018 0930 Gross per 24 hour  Intake 960 ml  Output 1350 ml  Net -390 ml   Filed Weights   07/16/18 0500 07/17/18 0508 07/18/18 0706  Weight: 85.8 kg 84.6 kg 83.4 kg    Examination:  General exam: Appears calm and comfortable  Respiratory system: Clear to auscultation. Respiratory effort normal.  Currently on 3 L nasal cannula. Cardiovascular system: S1 &  S2 heard, RRR. No JVD, murmurs, rubs, gallops or clicks. No pedal edema. Gastrointestinal system: Abdomen is nondistended, soft and nontender.  No organomegaly or masses felt. Normal bowel sounds heard. Central nervous system: Alert and oriented. No focal neurological deficits. Extremities: Symmetric 5 x 5 power. Skin: No rashes, lesions or ulcers Psychiatry: Judgement and insight appear normal. Mood & affect appropriate.     Data Reviewed: I have personally reviewed following labs and imaging studies  CBC: Recent Labs  Lab 07/13/18 1403 07/15/18 0459  WBC 6.8 9.5  NEUTROABS 5.6  --   HGB 16.6* 17.8*  HCT 54.3* 57.3*  MCV 102.1* 104.2*  PLT 308 292   Basic Metabolic Panel: Recent Labs  Lab 07/14/18 0209 07/15/18 0459 07/16/18 0423 07/17/18 0603 07/18/18 0600  NA 136 141 139 137 136  K 3.9 3.5 2.9* 3.4* 4.1  CL 93* 87* 83* 88* 87*  CO2 35* 41* 42* 39* 38*  GLUCOSE 117* 94 92 98 94  BUN 8 13 13 11 15   CREATININE 0.68 0.69 0.57 0.51 0.51  CALCIUM 7.8* 8.1* 7.6* 8.2* 8.6*  MG  --   --   --  1.9 1.8   GFR: Estimated Creatinine Clearance: 72 mL/min (by C-G formula based on SCr of 0.51 mg/dL). Liver Function Tests: Recent Labs  Lab 07/13/18 1403 07/14/18 0209 07/15/18 0459  AST 63* 40 32  ALT 37 32 28  ALKPHOS 127* 116 115  BILITOT 2.1* 1.4* 1.4*  PROT 7.1 6.7 7.2  ALBUMIN 3.2* 3.0* 3.2*   No results for input(s): LIPASE, AMYLASE in the last 168 hours. No results for input(s): AMMONIA in the last 168 hours. Coagulation Profile: No results for input(s): INR, PROTIME in the last 168 hours. Cardiac Enzymes: Recent Labs  Lab 07/13/18 1403 07/13/18 2017 07/14/18 0209  TROPONINI 0.12* 0.13* 0.14*   BNP (last 3 results) No results for input(s): PROBNP in the last 8760 hours. HbA1C: No results for input(s): HGBA1C in the last 72 hours. CBG: No results for input(s): GLUCAP in the last 168 hours. Lipid Profile: No results for input(s): CHOL, HDL, LDLCALC, TRIG, CHOLHDL, LDLDIRECT in the last 72 hours. Thyroid Function Tests: No results for input(s): TSH, T4TOTAL, FREET4, T3FREE, THYROIDAB in the  last 72 hours. Anemia Panel: No results for input(s): VITAMINB12, FOLATE, FERRITIN, TIBC, IRON, RETICCTPCT in the last 72 hours. Sepsis Labs: Recent Labs  Lab 07/13/18 1523  LATICACIDVEN 1.3    Recent Results (from the past 240 hour(s))  Urine Culture     Status: Abnormal   Collection Time: 07/13/18  2:04 PM  Result Value Ref Range Status   Specimen Description   Final    URINE, RANDOM Performed at [facility], [address] [place], [place] [address]    Special Requests   Final    NONE Performed at [facility], [address] [place], [place] [address]    Culture (A)  Final    <10,000 COLONIES/mL INSIGNIFICANT GROWTH Performed at [facility], [address] [place], [place] [address]    Report Status 07/15/2018 FINAL  Final         Radiology Studies: No results found.      Scheduled Meds: . enoxaparin (LOVENOX) injection  40 mg Subcutaneous Q24H  . ezetimibe  10 mg Oral QHS  . furosemide  40 mg Intravenous Q12H  . [START ON 07/19/2018] potassium chloride  40 mEq Oral Daily  . simvastatin  40 mg Oral QHS   Continuous Infusions:  LOS: 4 days    Time spent: 30 minutes    Pratik Hoover Brunette, DO [facility] Pager [phone]  If 7PM-7AM, please contact night-coverage [email or web address] Password [email or web address] 07/18/2018, 12:30 PM

## 2018-07-19 ENCOUNTER — Inpatient Hospital Stay (HOSPITAL_COMMUNITY): Payer: Medicare HMO

## 2018-07-19 LAB — BASIC METABOLIC PANEL
Anion gap: 12 (ref 5–15)
BUN: 15 mg/dL (ref 8–23)
CHLORIDE: 88 mmol/L — AB (ref 98–111)
CO2: 37 mmol/L — AB (ref 22–32)
Calcium: 8.9 mg/dL (ref 8.9–10.3)
Creatinine, Ser: 0.5 mg/dL (ref 0.44–1.00)
GFR calc Af Amer: >60 mL/min (ref >60)
GLUCOSE: 102 mg/dL — AB (ref 70–99)
POTASSIUM: 4 mmol/L (ref 3.5–5.1)
Sodium: 137 mmol/L (ref 135–145)

## 2018-07-19 MED ORDER — FUROSEMIDE 10 MG/ML IJ SOLN
60.0000 mg | Freq: Two times a day (BID) | INTRAMUSCULAR | Status: DC
  Administered 2018-07-19 – 2018-07-21 (×5): 60 mg via INTRAVENOUS
  Filled 2018-07-19 (×5): qty 6

## 2018-07-19 MED ORDER — ALPRAZOLAM 0.5 MG PO TABS
0.5000 mg | ORAL_TABLET | Freq: Every day | ORAL | Status: DC
  Administered 2018-07-19 – 2018-07-22 (×3): 0.5 mg via ORAL
  Filled 2018-07-19 (×4): qty 1

## 2018-07-19 MED ORDER — ASPIRIN EC 81 MG PO TBEC
81.0000 mg | DELAYED_RELEASE_TABLET | Freq: Every day | ORAL | Status: DC
  Administered 2018-07-19 – 2018-07-23 (×5): 81 mg via ORAL
  Filled 2018-07-19 (×5): qty 1

## 2018-07-19 NOTE — Care Management Important Message (Signed)
Important Message  Patient Details  Name: Nicole Welch MRN: [id number] Date of Birth: 03/06/1956   Medicare Important Message Given:  Yes    Renie Ora 07/19/2018, 9:42 AM

## 2018-07-19 NOTE — Progress Notes (Signed)
Progress Note  Patient Name: Nicole Welch Date of Encounter: 07/19/2018  Primary Cardiologist: Prentice Docker, MD   Subjective   SOB improving but not resolved  Inpatient Medications    Scheduled Meds: . enoxaparin (LOVENOX) injection  40 mg Subcutaneous Q24H  . ezetimibe  10 mg Oral QHS  . furosemide  40 mg Intravenous Q12H  . potassium chloride  40 mEq Oral Daily  . simvastatin  40 mg Oral QHS   Continuous Infusions:  PRN Meds: acetaminophen **OR** acetaminophen, ALPRAZolam, ipratropium-albuterol, oxyCODONE-acetaminophen, polyethylene glycol, sodium chloride   Vital Signs    Vitals:   07/18/18 1426 07/18/18 1800 07/18/18 2127 07/19/18 0644  BP: 127/65  116/71 103/60  Pulse: (!) 109  (!) 110 (!) 107  Resp: 18   18  Temp:   98.6 F (37 C) 98.6 F (37 C)  TempSrc:   Oral Oral  SpO2: 92% 93% 96% 96%  Weight:    82.3 kg  Height:        Intake/Output Summary (Last 24 hours) at 07/19/2018 0804 Last data filed at 07/19/2018 0700 Gross per 24 hour  Intake 720 ml  Output 2650 ml  Net -1930 ml   Filed Weights   07/17/18 0508 07/18/18 0706 07/19/18 0644  Weight: 84.6 kg 83.4 kg 82.3 kg    Telemetry    na   ECG    na  Physical Exam   GEN: No acute distress.   Neck: elevated jvd Cardiac: RRR, 2/6 systolic murmur rusb, no, rubs, or gallops.  Respiratory: decreased BS bilateral bases GI: Soft, nontender, non-distended  MS: No edema; No deformity. Neuro:  Nonfocal  Psych: Normal affect   Labs    Chemistry Recent Labs  Lab 07/13/18 1403 07/14/18 0209 07/15/18 0459  07/17/18 0603 07/18/18 0600 07/19/18 0559  NA 133* 136 141   < > 137 136 137  K 4.9 3.9 3.5   < > 3.4* 4.1 4.0  CL 92* 93* 87*   < > 88* 87* 88*  CO2 31 35* 41*   < > 39* 38* 37*  GLUCOSE 126* 117* 94   < > 98 94 102*  BUN 8 8 13    < > 11 15 15   CREATININE 0.69 0.68 0.69   < > 0.51 0.51 0.50  CALCIUM 8.5* 7.8* 8.1*   < > 8.2* 8.6* 8.9  PROT 7.1 6.7 7.2  --   --   --    --   ALBUMIN 3.2* 3.0* 3.2*  --   --   --   --   AST 63* 40 32  --   --   --   --   ALT 37 32 28  --   --   --   --   ALKPHOS 127* 116 115  --   --   --   --   BILITOT 2.1* 1.4* 1.4*  --   --   --   --   GFRNONAA >60 >60 >60   < > >60 >60 >60  GFRAA >60 >60 >60   < > >60 >60 >60  ANIONGAP 10 8 13    < > 10 11 12    < > = values in this interval not displayed.     Hematology Recent Labs  Lab 07/13/18 1403 07/15/18 0459  WBC 6.8 9.5  RBC 5.32* 5.50*  HGB 16.6* 17.8*  HCT 54.3* 57.3*  MCV 102.1* 104.2*  MCH 31.2 32.4  MCHC 30.6 31.1  RDW  15.0 14.9  PLT 308 292    Cardiac Enzymes Recent Labs  Lab 07/13/18 1403 07/13/18 2017 07/14/18 0209  TROPONINI 0.12* 0.13* 0.14*   No results for input(s): TROPIPOC in the last 168 hours.   BNP Recent Labs  Lab 07/13/18 1403  BNP 1,970.0*     DDimer No results for input(s): DDIMER in the last 168 hours.   Radiology    No results found.  Cardiac Studies    Patient Profile     62 y.o. female w/ PMHof HLD, anxiety, chronic back pain, tobacco use and family history of CADwho presented [facility] for evaluation of weakness, malaise, and dyspnea. Found to have an elevated BNP of 1970 and minimally elevated troponin values (0.12 --> 0.13 --> 0.14). Cardiology consulted for further management.  Assessment & Plan    1. Acute diastolic HF - 06/2018 echo LVEF 50-55%, grade I diastolic dysfunction, mild apical hypokinesis - I/Os are incomplete. From charting neg 1.9L yesterday, neg 4.4 L since admission. Renal function is stable, she is on lasix 40mg  IV bid.  - remains symptomatic, signs of fluid overload. Increase lasix to 60mg  IV bid. Repeat CXR   2. Elevated troponin - mild flat elevation peak 0.14 thought to be demand ischemia in setting of diastolic HF from Dr Junius Argyle original evaluation - does have some apical hypokinesis on echo. Perhaps has some chronic obstructive CAD. Clinical picture not  consistent with acute obstructive disease - plan is for outpatient nuclear stress test. Start ASA 81mg  daily.      For questions or updates, please contact [facility] Please consult [email or web address] for contact info under        Signed, Dina Rich, MD  07/19/2018, 8:04 AM

## 2018-07-19 NOTE — Progress Notes (Signed)
Patient gave permission to speak with sister regarding oxygen levels with ambulation earlier this shift. Patient is now requesting that her daughter, Shanda Bumps be the only family member to discuss care with.

## 2018-07-19 NOTE — Progress Notes (Signed)
O2 sat sitting on the side of the bed without oxygen 90%. Pt ambulated down the hall and back O2 sat at lowest 79% when returning back to bed. Pt placed back on 2L nasal cannula, O2 sat 92%. Will continue to monitor.

## 2018-07-19 NOTE — Progress Notes (Signed)
PROGRESS NOTE    Nicole Welch  ZOX:[id number] DOB: 11/23/1955 DOA: 07/13/2018 PCP: Elfredia Nevins, MD   Brief Narrative:   62 year old female admitted to the hospital with shortness of breath and generalized weakness. Found to have decompensated CHF exacerbation with mild elevation of troponin felt to be related to demand ischemia. Admitted for intravenous diuresis that is still ongoing with slow response and minimal improvement.     Assessment & Plan:  Principal Problem: Fluid overload Active Problems: Tobacco abuse Anxiety Acute diastolic CHF (congestive heart failure) (HCC) Demand ischemia (HCC) Acute respiratory failure with hypoxia (HCC)   1. Acute diastolic congestive heart failure-persistent.IV Lasixto now 60 mg twice daily per cardiology with repeat chest x-ray ordered for today. We will plan to wean oxygen further as tolerated.  She is negative approximately 1.5 L of fluid since yesterday, but still appears to be volume up.  2. Acute respiratory failure with hypoxia-persistent. Related to CHF exacerbation. Continue to wean down oxygen as toleratedtoday.Currently at 2 L from 3 L yesterday. 3. Hypokalemia- resolved.  Recheck levels in a.m. and continue once daily potassium supplementation. 4. Demand ischemia. Noted to have mildly elevated troponins. No complaints of chest pain. Echocardiogram does show some wall motion abnormalities, unclear if these are old. Further ischemic work-up per cardiology with outpatient stress test planned in the near future. 5. Anxiety. Continue on Xanax as needed,currently does cut to half due to some somnolence.  Currently on 0.25 mg 3 times daily as needed anxiety.  Xanax 0.5 mg at bedtime due to difficulty sleeping. 6. Tobacco abuse.  Counseled on cessation. 7. Hyperlipidemia. Continue statin   DVT prophylaxis:Lovenox Code Status:Full code Family Communication:Discussed with daughter at the  bedsideon 10/27 Disposition Plan:Discharge home once improved, hopefully next 48 hours.   Consultants:  Cardiology  Procedures: Echo: - Left ventricle: The cavity size was normal. Mild proximal septal hypertrophy. Systolic function was normal. The estimated ejection fraction was in the range of 50% to 55%. Doppler parameters are consistent with abnormal left ventricular relaxation (grade 1 diastolic dysfunction). Doppler parameters are consistent with high ventricular filling pressure. - Regional wall motion abnormality: Mild hypokinesis of the apical anterior, apical inferior, apical septal, and apical myocardium.  - Aortic valve: There was trivial regurgitation.  Antimicrobials:   None  Subjective: Patient seen and evaluated today with no new acute complaints or concerns. No acute concerns or events noted overnight.  She continues to have some ongoing shortness of breath, but appears to be feeling somewhat better today with a decrease in oxygen requirements noted as well.  Objective: Vitals:   07/18/18 1426 07/18/18 1800 07/18/18 2127 07/19/18 0644  BP: 127/65  116/71 103/60  Pulse: (!) 109  (!) 110 (!) 107  Resp: 18   18  Temp:   98.6 F (37 C) 98.6 F (37 C)  TempSrc:   Oral Oral  SpO2: 92% 93% 96% 96%  Weight:    82.3 kg  Height:        Intake/Output Summary (Last 24 hours) at 07/19/2018 0853 Last data filed at 07/19/2018 0700 Gross per 24 hour  Intake 720 ml  Output 2650 ml  Net -1930 ml   Filed Weights   07/17/18 0508 07/18/18 0706 07/19/18 0644  Weight: 84.6 kg 83.4 kg 82.3 kg    Examination:  General exam: Appears calm and comfortable  Respiratory system: Clear to auscultation. Respiratory effort normal.  Currently on 2 L nasal cannula. Cardiovascular system: S1 & S2 heard, RRR. No JVD,  murmurs, rubs, gallops or clicks. No pedal edema. Gastrointestinal system: Abdomen is nondistended, soft and nontender. No organomegaly or  masses felt. Normal bowel sounds heard. Central nervous system: Alert and oriented. No focal neurological deficits. Extremities: Symmetric 5 x 5 power. Skin: No rashes, lesions or ulcers Psychiatry: Judgement and insight appear normal. Mood & affect appropriate.     Data Reviewed: I have personally reviewed following labs and imaging studies  CBC: Recent Labs  Lab 07/13/18 1403 07/15/18 0459  WBC 6.8 9.5  NEUTROABS 5.6  --   HGB 16.6* 17.8*  HCT 54.3* 57.3*  MCV 102.1* 104.2*  PLT 308 292   Basic Metabolic Panel: Recent Labs  Lab 07/15/18 0459 07/16/18 0423 07/17/18 0603 07/18/18 0600 07/19/18 0559  NA 141 139 137 136 137  K 3.5 2.9* 3.4* 4.1 4.0  CL 87* 83* 88* 87* 88*  CO2 41* 42* 39* 38* 37*  GLUCOSE 94 92 98 94 102*  BUN 13 13 11 15 15   CREATININE 0.69 0.57 0.51 0.51 0.50  CALCIUM 8.1* 7.6* 8.2* 8.6* 8.9  MG  --   --  1.9 1.8  --    GFR: Estimated Creatinine Clearance: 71.6 mL/min (by C-G formula based on SCr of 0.5 mg/dL). Liver Function Tests: Recent Labs  Lab 07/13/18 1403 07/14/18 0209 07/15/18 0459  AST 63* 40 32  ALT 37 32 28  ALKPHOS 127* 116 115  BILITOT 2.1* 1.4* 1.4*  PROT 7.1 6.7 7.2  ALBUMIN 3.2* 3.0* 3.2*   No results for input(s): LIPASE, AMYLASE in the last 168 hours. No results for input(s): AMMONIA in the last 168 hours. Coagulation Profile: No results for input(s): INR, PROTIME in the last 168 hours. Cardiac Enzymes: Recent Labs  Lab 07/13/18 1403 07/13/18 2017 07/14/18 0209  TROPONINI 0.12* 0.13* 0.14*   BNP (last 3 results) No results for input(s): PROBNP in the last 8760 hours. HbA1C: No results for input(s): HGBA1C in the last 72 hours. CBG: No results for input(s): GLUCAP in the last 168 hours. Lipid Profile: No results for input(s): CHOL, HDL, LDLCALC, TRIG, CHOLHDL, LDLDIRECT in the last 72 hours. Thyroid Function Tests: No results for input(s): TSH, T4TOTAL, FREET4, T3FREE, THYROIDAB in the last 72  hours. Anemia Panel: No results for input(s): VITAMINB12, FOLATE, FERRITIN, TIBC, IRON, RETICCTPCT in the last 72 hours. Sepsis Labs: Recent Labs  Lab 07/13/18 1523  LATICACIDVEN 1.3    Recent Results (from the past 240 hour(s))  Urine Culture     Status: Abnormal   Collection Time: 07/13/18  2:04 PM  Result Value Ref Range Status   Specimen Description   Final    URINE, RANDOM Performed at [facility], [address], [place], [place] [address]    Special Requests   Final    NONE Performed at [facility], [address], [place], [place] [address]    Culture (A)  Final    <10,000 COLONIES/mL INSIGNIFICANT GROWTH Performed at [facility], [address], [place], [place] [address]    Report Status 07/15/2018 FINAL  Final         Radiology Studies: No results found.      Scheduled Meds: . ALPRAZolam  0.5 mg Oral QHS  . aspirin EC  81 mg Oral Daily  . enoxaparin (LOVENOX) injection  40 mg Subcutaneous Q24H  . ezetimibe  10 mg Oral QHS  . furosemide  60 mg Intravenous Q12H  . potassium chloride  40 mEq Oral Daily  . simvastatin  40 mg Oral QHS   Continuous Infusions:   LOS: 5 days    Time spent: 30 minutes    Jlee Harkless Hoover Brunette, DO [facility] Pager [phone]  If 7PM-7AM, please contact night-coverage [email or web address] Password [email or web address] 07/19/2018, 8:53 AM

## 2018-07-20 LAB — BASIC METABOLIC PANEL
Anion gap: 18 — ABNORMAL HIGH (ref 5–15)
BUN: 21 mg/dL (ref 8–23)
CHLORIDE: 86 mmol/L — AB (ref 98–111)
CO2: 31 mmol/L (ref 22–32)
Calcium: 9.1 mg/dL (ref 8.9–10.3)
Creatinine, Ser: 0.61 mg/dL (ref 0.44–1.00)
GFR calc Af Amer: >60 mL/min (ref >60)
GFR calc non Af Amer: >60 mL/min (ref >60)
Glucose, Bld: 103 mg/dL — ABNORMAL HIGH (ref 70–99)
POTASSIUM: 3.7 mmol/L (ref 3.5–5.1)
SODIUM: 135 mmol/L (ref 135–145)

## 2018-07-20 NOTE — Progress Notes (Signed)
PROGRESS NOTE    Nicole Welch  JWJ:[id number] DOB: [birth year] DOA: 07/13/2018 PCP: Elfredia Nevins, MD   Brief Narrative:   62 year old female admitted to the hospital with shortness of breath and generalized weakness. Found to have decompensated CHF exacerbation with mild elevation of troponin felt to be related to demand ischemia. Admitted for intravenous diuresisthat is still ongoing with slow response and minimal improvement.     Assessment & Plan:  Principal Problem: Fluid overload Active Problems: Tobacco abuse Anxiety Acute diastolic CHF (congestive heart failure) (HCC) Demand ischemia (HCC) Acute respiratory failure with hypoxia (HCC)   1. Acute diastolic congestive heart failure-persistent.IV Lasixto continue at 60 mg twice daily per cardiology and chest x-ray demonstrated some improvement yesterday.  Weight appears to be down 0.7 kg. We will plan to wean oxygen further as tolerated. Still appears to be volume up.  2. Acute respiratory failure with hypoxia-persistent, but slowly improving. Related to CHF exacerbation. Continue to wean down oxygen as toleratedtoday.Currently at 2 L which is similar to yesterday. 3. Hypokalemia-resolved. Recheck levels in a.m. and continue once daily potassium supplementation. 4. Demand ischemia. Noted to have mildly elevated troponins. No complaints of chest pain. Echocardiogram does show some wall motion abnormalities, unclear if these are old. Further ischemic work-up per cardiology with outpatient stress test planned in the near future. 5. Anxiety. Continue on Xanax as needed,currently does cut to half due to some somnolence.Currently on 0.25 mg 3 times daily as needed anxiety.  Xanax 0.5 mg at bedtime due to difficulty sleeping. 6. Tobacco abuse.Counseled on cessation. 7. Hyperlipidemia. Continue statin   DVT prophylaxis:Lovenox Code Status:Full code Family  Communication:Discussed with daughter at the bedsideon 10/27 Disposition Plan:Discharge home once improved,hopefully next 48 hours.   Consultants:  Cardiology  Procedures: Echo: - Left ventricle: The cavity size was normal. Mild proximal septal hypertrophy. Systolic function was normal. The estimated ejection fraction was in the range of 50% to 55%. Doppler parameters are consistent with abnormal left ventricular relaxation (grade 1 diastolic dysfunction). Doppler parameters are consistent with high ventricular filling pressure. - Regional wall motion abnormality: Mild hypokinesis of the apical anterior, apical inferior, apical septal, and apical myocardium.  - Aortic valve: There was trivial regurgitation.  Antimicrobials:   None  Subjective: Patient seen and evaluated today with no new acute complaints or concerns. No acute concerns or events noted overnight.  She continues to have shortness of breath with minimal improvement noted.  Objective: Vitals:   07/19/18 2003 07/19/18 2151 07/20/18 0500 07/20/18 0628  BP:  130/83  100/64  Pulse:  (!) 110  (!) 108  Resp:    16  Temp:    98.7 F (37.1 C)  TempSrc:    Oral  SpO2: 93% 95%  95%  Weight:   81.6 kg   Height:        Intake/Output Summary (Last 24 hours) at 07/20/2018 1148 Last data filed at 07/20/2018 0400 Gross per 24 hour  Intake 1080 ml  Output 900 ml  Net 180 ml   Filed Weights   07/18/18 0706 07/19/18 0644 07/20/18 0500  Weight: 83.4 kg 82.3 kg 81.6 kg    Examination:  General exam: Appears calm and comfortable  Respiratory system: Clear to auscultation. Respiratory effort normal.  Currently on 2 L nasal cannula. Cardiovascular system: S1 & S2 heard, RRR. No JVD, murmurs, rubs, gallops or clicks. No pedal edema. Gastrointestinal system: Abdomen is nondistended, soft and nontender. No organomegaly or masses felt. Normal bowel sounds heard. Central  nervous system: Alert and  oriented. No focal neurological deficits. Extremities: Symmetric 5 x 5 power. Skin: No rashes, lesions or ulcers Psychiatry: Judgement and insight appear normal. Mood & affect appropriate.     Data Reviewed: I have personally reviewed following labs and imaging studies  CBC: Recent Labs  Lab 07/13/18 1403 07/15/18 0459  WBC 6.8 9.5  NEUTROABS 5.6  --   HGB 16.6* 17.8*  HCT 54.3* 57.3*  MCV 102.1* 104.2*  PLT 308 292   Basic Metabolic Panel: Recent Labs  Lab 07/16/18 0423 07/17/18 0603 07/18/18 0600 07/19/18 0559 07/20/18 0509  NA 139 137 136 137 135  K 2.9* 3.4* 4.1 4.0 3.7  CL 83* 88* 87* 88* 86*  CO2 42* 39* 38* 37* 31  GLUCOSE 92 98 94 102* 103*  BUN 13 11 15 15 21   CREATININE 0.57 0.51 0.51 0.50 0.61  CALCIUM 7.6* 8.2* 8.6* 8.9 9.1  MG  --  1.9 1.8  --   --    GFR: Estimated Creatinine Clearance: 71.2 mL/min (by C-G formula based on SCr of 0.61 mg/dL). Liver Function Tests: Recent Labs  Lab 07/13/18 1403 07/14/18 0209 07/15/18 0459  AST 63* 40 32  ALT 37 32 28  ALKPHOS 127* 116 115  BILITOT 2.1* 1.4* 1.4*  PROT 7.1 6.7 7.2  ALBUMIN 3.2* 3.0* 3.2*   No results for input(s): LIPASE, AMYLASE in the last 168 hours. No results for input(s): AMMONIA in the last 168 hours. Coagulation Profile: No results for input(s): INR, PROTIME in the last 168 hours. Cardiac Enzymes: Recent Labs  Lab 07/13/18 1403 07/13/18 2017 07/14/18 0209  TROPONINI 0.12* 0.13* 0.14*   BNP (last 3 results) No results for input(s): PROBNP in the last 8760 hours. HbA1C: No results for input(s): HGBA1C in the last 72 hours. CBG: No results for input(s): GLUCAP in the last 168 hours. Lipid Profile: No results for input(s): CHOL, HDL, LDLCALC, TRIG, CHOLHDL, LDLDIRECT in the last 72 hours. Thyroid Function Tests: No results for input(s): TSH, T4TOTAL, FREET4, T3FREE, THYROIDAB in the last 72 hours. Anemia Panel: No results for input(s): VITAMINB12, FOLATE, FERRITIN, TIBC,  IRON, RETICCTPCT in the last 72 hours. Sepsis Labs: Recent Labs  Lab 07/13/18 1523  LATICACIDVEN 1.3    Recent Results (from the past 240 hour(s))  Urine Culture     Status: Abnormal   Collection Time: 07/13/18  2:04 PM  Result Value Ref Range Status   Specimen Description   Final    URINE, RANDOM Performed at [facility], [address], [place], [place] [address]    Special Requests   Final    NONE Performed at [facility], [address] [place], [place] [address]    Culture (A)  Final    <10,000 COLONIES/mL INSIGNIFICANT GROWTH Performed at [facility], [address], [place], [place] [address]    Report Status 07/15/2018 FINAL  Final         Radiology Studies: Dg Chest 2 View  Result Date: 07/19/2018 CLINICAL DATA:  Short of breath EXAM: CHEST - 2 VIEW COMPARISON:  07/11/2018 FINDINGS: Moderate cardiomegaly. Vascular congestion. Pulmonary edema has improved. Bilateral pleural effusions have improved. No pneumothorax. IMPRESSION: Improved pulmonary edema and bilateral pleural effusions. Vascular congestion and mild interstitial edema persist. Electronically Signed   By: Jolaine Click M.D.   On: 07/19/2018 08:51        Scheduled Meds: . ALPRAZolam  0.5 mg Oral QHS  . aspirin EC  81 mg  Oral Daily  . enoxaparin (LOVENOX) injection  40 mg Subcutaneous Q24H  . ezetimibe  10 mg Oral QHS  . furosemide  60 mg Intravenous Q12H  . potassium chloride  40 mEq Oral Daily  . simvastatin  40 mg Oral QHS   Continuous Infusions:   LOS: 6 days    Time spent: 30 minutes    Briauna Gilmartin Hoover Brunette, DO [facility] Pager [phone]  If 7PM-7AM, please contact night-coverage [email or web address] Password [email or web address] 07/20/2018, 11:48 AM

## 2018-07-20 NOTE — Progress Notes (Signed)
Progress Note  Patient Name: Nicole Welch Date of Encounter: 07/20/2018  Primary Cardiologist: Prentice Docker, MD   Subjective   SOB is improving but not resolved.      Inpatient Medications    Scheduled Meds: . ALPRAZolam  0.5 mg Oral QHS  . aspirin EC  81 mg Oral Daily  . enoxaparin (LOVENOX) injection  40 mg Subcutaneous Q24H  . ezetimibe  10 mg Oral QHS  . furosemide  60 mg Intravenous Q12H  . potassium chloride  40 mEq Oral Daily  . simvastatin  40 mg Oral QHS   Continuous Infusions:  PRN Meds: acetaminophen **OR** acetaminophen, ALPRAZolam, ipratropium-albuterol, oxyCODONE-acetaminophen, polyethylene glycol, sodium chloride   Vital Signs    Vitals:   07/19/18 2003 07/19/18 2151 07/20/18 0500 07/20/18 0628  BP:  130/83  100/64  Pulse:  (!) 110  (!) 108  Resp:    16  Temp:    98.7 F (37.1 C)  TempSrc:    Oral  SpO2: 93% 95%  95%  Weight:   81.6 kg   Height:        Intake/Output Summary (Last 24 hours) at 07/20/2018 0833 Last data filed at 07/20/2018 0400 Gross per 24 hour  Intake 1320 ml  Output 900 ml  Net 420 ml   Filed Weights   07/18/18 0706 07/19/18 0644 07/20/18 0500  Weight: 83.4 kg 82.3 kg 81.6 kg    Telemetry    Sinus tach 110 - Personally Reviewed  ECG    Na   Physical Exam   GEN: No acute distress.   Neck: No JVD Cardiac: RRR, no murmurs, rubs, or gallops.  Respiratory: Clear to auscultation bilaterally. GI: Soft, nontender, non-distended  MS: No edema; No deformity. Neuro:  Nonfocal  Psych: Normal affect   Labs    Chemistry Recent Labs  Lab 07/13/18 1403 07/14/18 0209 07/15/18 0459  07/18/18 0600 07/19/18 0559 07/20/18 0509  NA 133* 136 141   < > 136 137 135  K 4.9 3.9 3.5   < > 4.1 4.0 3.7  CL 92* 93* 87*   < > 87* 88* 86*  CO2 31 35* 41*   < > 38* 37* 31  GLUCOSE 126* 117* 94   < > 94 102* 103*  BUN 8 8 13    < > 15 15 21   CREATININE 0.69 0.68 0.69   < > 0.51 0.50 0.61  CALCIUM 8.5* 7.8* 8.1*    < > 8.6* 8.9 9.1  PROT 7.1 6.7 7.2  --   --   --   --   ALBUMIN 3.2* 3.0* 3.2*  --   --   --   --   AST 63* 40 32  --   --   --   --   ALT 37 32 28  --   --   --   --   ALKPHOS 127* 116 115  --   --   --   --   BILITOT 2.1* 1.4* 1.4*  --   --   --   --   GFRNONAA >60 >60 >60   < > >60 >60 >60  GFRAA >60 >60 >60   < > >60 >60 >60  ANIONGAP 10 8 13    < > 11 12 18*   < > = values in this interval not displayed.     Hematology Recent Labs  Lab 07/13/18 1403 07/15/18 0459  WBC 6.8 9.5  RBC 5.32* 5.50*  HGB  16.6* 17.8*  HCT 54.3* 57.3*  MCV 102.1* 104.2*  MCH 31.2 32.4  MCHC 30.6 31.1  RDW 15.0 14.9  PLT 308 292    Cardiac Enzymes Recent Labs  Lab 07/13/18 1403 07/13/18 2017 07/14/18 0209  TROPONINI 0.12* 0.13* 0.14*   No results for input(s): TROPIPOC in the last 168 hours.   BNP Recent Labs  Lab 07/13/18 1403  BNP 1,970.0*     DDimer No results for input(s): DDIMER in the last 168 hours.   Radiology    Dg Chest 2 View  Result Date: 07/19/2018 CLINICAL DATA:  Short of breath EXAM: CHEST - 2 VIEW COMPARISON:  07/11/2018 FINDINGS: Moderate cardiomegaly. Vascular congestion. Pulmonary edema has improved. Bilateral pleural effusions have improved. No pneumothorax. IMPRESSION: Improved pulmonary edema and bilateral pleural effusions. Vascular congestion and mild interstitial edema persist. Electronically Signed   By: Jolaine Click M.D.   On: 07/19/2018 08:51    Cardiac Studies    Patient Profile     62 y.o.femalew/ PMHof HLD, anxiety, chronic back pain, tobacco use and family history of CADwho [facility] for evaluation of weakness, malaise, and dyspnea. Found to have an elevated BNP of 1970 and minimally elevated troponin values (0.12 --> 0.13 --> 0.14). Cardiology consulted for further management.  Assessment & Plan    1. Acute diastolic HF - 06/2018 echo LVEF 50-55%, grade I diastolic dysfunction, mild apical hypokinesis -  I/Os are incomplete. CXR yesterday with improving edema.Weight down 0.7 kg from yesterday. Yesterday we increased lasix to 60mg  IV bid, renal function remains normal with mild uptrend in BUN/Cr. CXR shows we are making progress with diuresis, her weight and I/Os data is poor guide.   - from nursing notes yesterday still hypoxic down to 90% off O2, 79% with ambulation - continue diuresis today. Repeat O2 sats off O2 and with ambulation.    2. Elevated troponin - mild flat elevation peak 0.14 thought to be demand ischemia in setting of diastolic HF from Dr Junius Argyle original evaluation - does have some apical hypokinesis on echo. Perhaps has some chronic obstructive CAD. Clinical picture not consistent with acute obstructive disease - plan is for outpatient nuclear stress test. Start ASA 81mg  daily.       For questions or updates, please contact [facility] Please consult [email or web address] for contact info under        Signed, Dina Rich, MD  07/20/2018, 8:33 AM

## 2018-07-20 NOTE — Progress Notes (Signed)
Pt ambulated approximately 200 feet in the hallway, tolerated well. Pt sat's stayed between 94-95% on 2 liters of oxygen. Since patient tolerated walking well and sat's stayed WNL, weaned patient's oxygen to 1 liter. Will re-assess and check sat's again soon to see if patient is tolerating 1 liter of oxygen.

## 2018-07-20 NOTE — Care Management Note (Signed)
Case Management Note  Patient Details  Name: Nicole Welch MRN: [id number] Date of Birth: [birth year]  Status of Service:  In process, will continue to follow  If discussed at Long Length of Stay Meetings, dates discussed:  07/20/18  Additional Comments:  Malcolm Metro, RN 07/20/2018, 1:56 PM

## 2018-07-21 LAB — BASIC METABOLIC PANEL
Anion gap: 15 (ref 5–15)
BUN: 21 mg/dL (ref 8–23)
CALCIUM: 9 mg/dL (ref 8.9–10.3)
CO2: 35 mmol/L — ABNORMAL HIGH (ref 22–32)
CREATININE: 0.63 mg/dL (ref 0.44–1.00)
Chloride: 83 mmol/L — ABNORMAL LOW (ref 98–111)
GFR calc Af Amer: >60 mL/min (ref >60)
Glucose, Bld: 104 mg/dL — ABNORMAL HIGH (ref 70–99)
Potassium: 3.7 mmol/L (ref 3.5–5.1)
Sodium: 133 mmol/L — ABNORMAL LOW (ref 135–145)

## 2018-07-21 MED ORDER — NICOTINE 7 MG/24HR TD PT24
7.0000 mg | MEDICATED_PATCH | Freq: Every day | TRANSDERMAL | Status: DC
  Administered 2018-07-21 – 2018-07-23 (×3): 7 mg via TRANSDERMAL
  Filled 2018-07-21 (×3): qty 1

## 2018-07-21 MED ORDER — FUROSEMIDE 10 MG/ML IJ SOLN: 80.0000 mg | Freq: Two times a day (BID) | INTRAMUSCULAR | Status: DC

## 2018-07-21 MED ORDER — FUROSEMIDE 10 MG/ML IJ SOLN
20.0000 mg | Freq: Once | INTRAMUSCULAR | Status: AC
  Administered 2018-07-21: 20 mg via INTRAVENOUS
  Filled 2018-07-21: qty 2

## 2018-07-21 MED ORDER — FUROSEMIDE 10 MG/ML IJ SOLN
80.0000 mg | Freq: Two times a day (BID) | INTRAMUSCULAR | Status: DC
  Administered 2018-07-21 – 2018-07-23 (×5): 80 mg via INTRAVENOUS
  Filled 2018-07-21 (×5): qty 8

## 2018-07-21 NOTE — Progress Notes (Signed)
Progress Note  Patient Name: Nicole Welch Date of Encounter: 07/21/2018  Primary Cardiologist: Prentice Docker, MD   Subjective   SOB improving.   Inpatient Medications    Scheduled Meds: . ALPRAZolam  0.5 mg Oral QHS  . aspirin EC  81 mg Oral Daily  . enoxaparin (LOVENOX) injection  40 mg Subcutaneous Q24H  . ezetimibe  10 mg Oral QHS  . furosemide  60 mg Intravenous Q12H  . potassium chloride  40 mEq Oral Daily  . simvastatin  40 mg Oral QHS   Continuous Infusions:  PRN Meds: acetaminophen **OR** acetaminophen, ALPRAZolam, ipratropium-albuterol, oxyCODONE-acetaminophen, polyethylene glycol, sodium chloride   Vital Signs    Vitals:   07/21/18 0510 07/21/18 0816 07/21/18 0848 07/21/18 0849  BP: 120/64     Pulse: (!) 104     Resp: 19     Temp: 98 F (36.7 C)     TempSrc: Oral     SpO2: 90% 90% (!) 86% 91%  Weight:      Height:        Intake/Output Summary (Last 24 hours) at 07/21/2018 0851 Last data filed at 07/21/2018 0600 Gross per 24 hour  Intake 680 ml  Output 2450 ml  Net -1770 ml   Filed Weights   07/19/18 0644 07/20/18 0500 07/21/18 0439  Weight: 82.3 kg 81.6 kg 80 kg    Telemetry    SR - Personally Reviewed  ECG    na  Physical Exam   GEN: No acute distress.   Neck: No JVD Cardiac: RRR, no murmurs, rubs, or gallops.  Respiratory:mild bilaterla crackles.  GI: Soft, nontender, non-distended  MS: No edema; No deformity. Neuro:  Nonfocal  Psych: Normal affect   Labs    Chemistry Recent Labs  Lab 07/15/18 0459  07/19/18 0559 07/20/18 0509 07/21/18 0521  NA 141   < > 137 135 133*  K 3.5   < > 4.0 3.7 3.7  CL 87*   < > 88* 86* 83*  CO2 41*   < > 37* 31 35*  GLUCOSE 94   < > 102* 103* 104*  BUN 13   < > 15 21 21   CREATININE 0.69   < > 0.50 0.61 0.63  CALCIUM 8.1*   < > 8.9 9.1 9.0  PROT 7.2  --   --   --   --   ALBUMIN 3.2*  --   --   --   --   AST 32  --   --   --   --   ALT 28  --   --   --   --   ALKPHOS 115   --   --   --   --   BILITOT 1.4*  --   --   --   --   GFRNONAA >60   < > >60 >60 >60  GFRAA >60   < > >60 >60 >60  ANIONGAP 13   < > 12 18* 15   < > = values in this interval not displayed.     Hematology Recent Labs  Lab 07/15/18 0459  WBC 9.5  RBC 5.50*  HGB 17.8*  HCT 57.3*  MCV 104.2*  MCH 32.4  MCHC 31.1  RDW 14.9  PLT 292    Cardiac EnzymesNo results for input(s): TROPONINI in the last 168 hours. No results for input(s): TROPIPOC in the last 168 hours.   BNPNo results for input(s): BNP, PROBNP in the last  168 hours.   DDimer No results for input(s): DDIMER in the last 168 hours.   Radiology    No results found.  Cardiac Studies     Patient Profile     62 y.o.femalew/ PMHof HLD, anxiety, chronic back pain, tobacco use and family history of CADwho presented to [facility] ED [facility] 07[facility] for evaluation of weakness, malaise, and dyspnea. Found to have an elevated BNP of 1970 and minimally elevated troponin values (0.12 -->0.13 -->0.14). Cardiology consulted for further management.  Assessment & Plan    1. Acute diastolic HF - 06/2018 echo LVEF 50-55%, grade I diastolic dysfunction, mild apical hypokinesis - I/Os are incomplete this admission. Negative 1.7 L yesterday and 6.6 L since admission based on available data.  Repeat CXR with improving edema. She is on lasix 60mg  IV bid, stable renal function - has had some ongoing issues with hypoxia, though improving with diuresis.   - nearing euvolemia, still requiring some O2. Increase lasix to 80mg  IV bid today, would anticipate changing to oral tomorrow and likely discharge pending her O2 sats. Likely would d/c on oral lasix 60mg  bid.     2. Elevated troponin - mild flat elevation peak 0.14 thought to be demand ischemia in setting of diastolic HF from Dr Junius Argyle original evaluation - does have some apical hypokinesis on echo. Perhaps has some chronic obstructive CAD. Clinical picture not consistent  with acute obstructive disease - plan is for outpatient nuclear stress test. Start ASA 81mg  daily.   For questions or updates, please contact [facility] Please consult [email or web address] for contact info under        Signed, Dina Rich, MD  07/21/2018, 8:51 AM

## 2018-07-21 NOTE — Progress Notes (Signed)
PROGRESS NOTE    Nicole Welch  ZOX:[id number] DOB: September 02, 1956 DOA: 07/13/2018 PCP: Elfredia Nevins, MD   Brief Narrative:   62 year old female admitted to the hospital with shortness of breath and generalized weakness. Found to have decompensated CHF exacerbation with mild elevation of troponin felt to be related to demand ischemia. Admitted for intravenous diuresisthat is still ongoing with slow response and minimal improvement.    Assessment & Plan:  Principal Problem: Fluid overload Active Problems: Tobacco abuse Anxiety Acute diastolic CHF (congestive heart failure) (HCC) Demand ischemia (HCC) Acute respiratory failure with hypoxia (HCC)   1. Acute diastolic congestive heart failure-persistent.IV Lasixincrease to 80mg  twice daily per cardiology and chest x-ray demonstrated some improvement on 10/28.  Negative 1.7 L yesterday. We will plan to wean oxygen further as tolerated. Still appears to be volume up.Will likely need to be discharged on oral lasix 60mg  BID. 2. Acute respiratory failure with hypoxia-persistent, but slowly improving. Related to CHF exacerbation. Continue to wean down oxygen as toleratedtoday.Currently at 1 L which is improved from yesterday. 3. Hypokalemia-resolved. Recheck levels in a.m. andcontinue once daily potassium supplementation. 4. Demand ischemia. Noted to have mildly elevated troponins. No complaints of chest pain. Echocardiogram does show some wall motion abnormalities, unclear if these are old. Further ischemic work-up per cardiologywith outpatient stress test planned in the near future. Start ASA 81mg  per Cardiology. 5. Anxiety. Continue on Xanax as needed,currently does cut to half due to some somnolence.Currently on 0.25 mg 3 times daily as needed anxiety.Xanax 0.5 mg at bedtime due to difficulty sleeping. 6. Tobacco abuse.Counseled on cessation. 7. Hyperlipidemia. Continue  statin.   DVT prophylaxis:Lovenox Code Status:Full code Family Communication:Discussed with daughter at the bedsideon 10/27 Disposition Plan:Discharge home once improved,hopefully next 24 hours.   Consultants:  Cardiology  Procedures: Echo: - Left ventricle: The cavity size was normal. Mild proximal septal hypertrophy. Systolic function was normal. The estimated ejection fraction was in the range of 50% to 55%. Doppler parameters are consistent with abnormal left ventricular relaxation (grade 1 diastolic dysfunction). Doppler parameters are consistent with high ventricular filling pressure. - Regional wall motion abnormality: Mild hypokinesis of the apical anterior, apical inferior, apical septal, and apical myocardium.  - Aortic valve: There was trivial regurgitation.  Antimicrobials:   None  Subjective: Patient seen and evaluated today with no new acute complaints or concerns. No acute concerns or events noted overnight. She states that her dyspnea is slowly improving.  Objective: Vitals:   07/21/18 0510 07/21/18 0816 07/21/18 0848 07/21/18 0849  BP: 120/64     Pulse: (!) 104     Resp: 19     Temp: 98 F (36.7 C)     TempSrc: Oral     SpO2: 90% 90% (!) 86% 91%  Weight:      Height:        Intake/Output Summary (Last 24 hours) at 07/21/2018 0940 Last data filed at 07/21/2018 0600 Gross per 24 hour  Intake 480 ml  Output 2450 ml  Net -1970 ml   Filed Weights   07/19/18 0644 07/20/18 0500 07/21/18 0439  Weight: 82.3 kg 81.6 kg 80 kg    Examination:  General exam: Appears calm and comfortable  Respiratory system: Clear to auscultation. Respiratory effort normal. On 1L [place]. Cardiovascular system: S1 & S2 heard, RRR. No JVD, murmurs, rubs, gallops or clicks. No pedal edema. Gastrointestinal system: Abdomen is nondistended, soft and nontender. No organomegaly or masses felt. Normal bowel sounds heard. Central nervous system: Alert  and oriented. No focal neurological deficits. Extremities: Symmetric 5 x 5 power. Skin: No rashes, lesions or ulcers Psychiatry: Judgement and insight appear normal. Mood & affect appropriate.     Data Reviewed: I have personally reviewed following labs and imaging studies  CBC: Recent Labs  Lab 07/15/18 0459  WBC 9.5  HGB 17.8*  HCT 57.3*  MCV 104.2*  PLT 292   Basic Metabolic Panel: Recent Labs  Lab 07/17/18 0603 07/18/18 0600 07/19/18 0559 07/20/18 0509 07/21/18 0521  NA 137 136 137 135 133*  K 3.4* 4.1 4.0 3.7 3.7  CL 88* 87* 88* 86* 83*  CO2 39* 38* 37* 31 35*  GLUCOSE 98 94 102* 103* 104*  BUN 11 15 15 21 21   CREATININE 0.51 0.51 0.50 0.61 0.63  CALCIUM 8.2* 8.6* 8.9 9.1 9.0  MG 1.9 1.8  --   --   --    GFR: Estimated Creatinine Clearance: 70.6 mL/min (by C-G formula based on SCr of 0.63 mg/dL). Liver Function Tests: Recent Labs  Lab 07/15/18 0459  AST 32  ALT 28  ALKPHOS 115  BILITOT 1.4*  PROT 7.2  ALBUMIN 3.2*   No results for input(s): LIPASE, AMYLASE in the last 168 hours. No results for input(s): AMMONIA in the last 168 hours. Coagulation Profile: No results for input(s): INR, PROTIME in the last 168 hours. Cardiac Enzymes: No results for input(s): CKTOTAL, CKMB, CKMBINDEX, TROPONINI in the last 168 hours. BNP (last 3 results) No results for input(s): PROBNP in the last 8760 hours. HbA1C: No results for input(s): HGBA1C in the last 72 hours. CBG: No results for input(s): GLUCAP in the last 168 hours. Lipid Profile: No results for input(s): CHOL, HDL, LDLCALC, TRIG, CHOLHDL, LDLDIRECT in the last 72 hours. Thyroid Function Tests: No results for input(s): TSH, T4TOTAL, FREET4, T3FREE, THYROIDAB in the last 72 hours. Anemia Panel: No results for input(s): VITAMINB12, FOLATE, FERRITIN, TIBC, IRON, RETICCTPCT in the last 72 hours. Sepsis Labs: No results for input(s): PROCALCITON, LATICACIDVEN in the last 168 hours.  Recent Results (from  the past 240 hour(s))  Urine Culture     Status: Abnormal   Collection Time: 07/13/18  2:04 PM  Result Value Ref Range Status   Specimen Description   Final    URINE, RANDOM Performed at [facility], [address] [place], [place] [address]    Special Requests   Final    NONE Performed at [facility], [address], [place], [place] [address]    Culture (A)  Final    <10,000 COLONIES/mL INSIGNIFICANT GROWTH Performed at [facility], [address] [place], [place] [address]    Report Status 07/15/2018 FINAL  Final         Radiology Studies: No results found.      Scheduled Meds: . ALPRAZolam  0.5 mg Oral QHS  . aspirin EC  81 mg Oral Daily  . enoxaparin (LOVENOX) injection  40 mg Subcutaneous Q24H  . ezetimibe  10 mg Oral QHS  . furosemide  20 mg Intravenous Once  . furosemide  80 mg Intravenous BID  . potassium chloride  40 mEq Oral Daily  . simvastatin  40 mg Oral QHS   Continuous Infusions:   LOS: 7 days    Time spent: 30 minutes    Perris Conwell Hoover Brunette, DO [facility] Pager [phone]  If 7PM-7AM, please contact night-coverage [email or web address] Password [email or web address] 07/21/2018, 9:40 AM

## 2018-07-22 LAB — BASIC METABOLIC PANEL
Anion gap: 14 (ref 5–15)
BUN: 20 mg/dL (ref 8–23)
CALCIUM: 8.8 mg/dL — AB (ref 8.9–10.3)
CO2: 34 mmol/L — AB (ref 22–32)
CREATININE: 0.51 mg/dL (ref 0.44–1.00)
Chloride: 83 mmol/L — ABNORMAL LOW (ref 98–111)
Glucose, Bld: 96 mg/dL (ref 70–99)
Potassium: 3.2 mmol/L — ABNORMAL LOW (ref 3.5–5.1)
SODIUM: 131 mmol/L — AB (ref 135–145)

## 2018-07-22 MED ORDER — POTASSIUM CHLORIDE 20 MEQ PO PACK
40.0000 meq | PACK | Freq: Two times a day (BID) | ORAL | Status: DC
  Administered 2018-07-22 – 2018-07-23 (×2): 40 meq via ORAL
  Filled 2018-07-22 (×2): qty 2

## 2018-07-22 NOTE — Progress Notes (Signed)
Progress Note  Patient Name: Nicole Welch Date of Encounter: 07/22/2018  Primary Cardiologist: Prentice Docker, MD   Subjective   SOB improving.   Inpatient Medications    Scheduled Meds: . ALPRAZolam  0.5 mg Oral QHS  . aspirin EC  81 mg Oral Daily  . enoxaparin (LOVENOX) injection  40 mg Subcutaneous Q24H  . ezetimibe  10 mg Oral QHS  . furosemide  80 mg Intravenous BID  . nicotine  7 mg Transdermal Daily  . potassium chloride  40 mEq Oral Daily  . simvastatin  40 mg Oral QHS   Continuous Infusions:  PRN Meds: acetaminophen **OR** acetaminophen, ALPRAZolam, ipratropium-albuterol, oxyCODONE-acetaminophen, polyethylene glycol, sodium chloride   Vital Signs    Vitals:   07/21/18 1440 07/21/18 2027 07/21/18 2159 07/22/18 0601  BP: 122/75  104/63 (!) 117/56  Pulse: (!) 107  (!) 111 92  Resp:   18 18  Temp:   97.7 F (36.5 C) 97.9 F (36.6 C)  TempSrc:   Oral Oral  SpO2: 91% 94% 93% 96%  Weight:    80.7 kg  Height:        Intake/Output Summary (Last 24 hours) at 07/22/2018 0940 Last data filed at 07/22/2018 0500 Gross per 24 hour  Intake -  Output 1800 ml  Net -1800 ml   Filed Weights   07/20/18 0500 07/21/18 0439 07/22/18 0601  Weight: 81.6 kg 80 kg 80.7 kg    Telemetry    na - Personally Reviewed  ECG     Physical Exam   GEN: No acute distress.   Neck: elevated JVD Cardiac: RRR, no murmurs, rubs, or gallops.  Respiratory: mild bilateral crackles.  GI: Soft, nontender, non-distended  MS: No edema; No deformity. Neuro:  Nonfocal  Psych: Normal affect   Labs    Chemistry Recent Labs  Lab 07/20/18 0509 07/21/18 0521 07/22/18 0422  NA 135 133* 131*  K 3.7 3.7 3.2*  CL 86* 83* 83*  CO2 31 35* 34*  GLUCOSE 103* 104* 96  BUN 21 21 20   CREATININE 0.61 0.63 0.51  CALCIUM 9.1 9.0 8.8*  GFRNONAA >60 >60 >60  GFRAA >60 >60 >60  ANIONGAP 18* 15 14     HematologyNo results for input(s): WBC, RBC, HGB, HCT, MCV, MCH, MCHC, RDW,  PLT in the last 168 hours.  Cardiac EnzymesNo results for input(s): TROPONINI in the last 168 hours. No results for input(s): TROPIPOC in the last 168 hours.   BNPNo results for input(s): BNP, PROBNP in the last 168 hours.   DDimer No results for input(s): DDIMER in the last 168 hours.   Radiology    No results found.  Cardiac Studies    Patient Profile     62 y.o. female 62 y.o.femalew/ PMHof HLD, anxiety, chronic back pain, tobacco use and family history of CADwho presented to [facility] on 07/13/2018 for evaluation of weakness, malaise, and dyspnea. Found to have an elevated BNP of 1970 and minimally elevated troponin values (0.12 -->0.13 -->0.14). Cardiology consulted for further management.  Assessment & Plan    1. Acute diastolic HF - 06/2018 echo LVEF 50-55%, grade I diastolic dysfunction, mild apical hypokinesis - I/Os are incomplete this admission.Repeat CXR with improving edema. She is on lasix 80mg  IV bid which was increased yesterday, stable renal function. From weights down 89.8 kg to 80.7 kg, nearly 20 lbs - has had some ongoing issues with hypoxia, though improving with diuresis. Notes today mention continued hypoxia with  ambulation.   Continue IV diuresis. She came in severely volume overloaded with over 20 lbs of diuresis thus far, requiring extended admission for diuresis.     2. Elevated troponin - mild flat elevation peak 0.14 thought to be demand ischemia in setting of diastolic HF from Dr Junius Argyle original evaluation - does have some apical hypokinesis on echo. Perhaps has some chronic obstructive CAD. Clinical picture not consistent with acute obstructive disease - plan is for outpatient nuclear stress test. Start ASA 81mg  daily.   3. Hypokalemia - replacement per primary team.    For questions or updates, please contact [facility] Please consult [email or web address] for contact info under        Signed, Dina Rich, MD    07/22/2018, 9:40 AM

## 2018-07-22 NOTE — Progress Notes (Signed)
SATURATION QUALIFICATIONS: (This note is used to comply with regulatory documentation for home oxygen)  Patient Saturations on Room Air at Rest = 92%  Patient Saturations on Room Air while Ambulating = 86%  Patient Saturations on 2 Liters of oxygen while Ambulating = 93%

## 2018-07-22 NOTE — Progress Notes (Signed)
PROGRESS NOTE    Nicole Welch  ZOX:[id number] DOB: 16-Jan-1956 DOA: 07/13/2018 PCP: Elfredia Nevins, MD   Brief Narrative:   62 year old female admitted to the hospital with shortness of breath and generalized weakness. Found to have decompensated CHF exacerbation with mild elevation of troponin felt to be related to demand ischemia. Admitted for intravenous diuresisthat is still ongoing with slow response and minimal improvement.    Assessment & Plan:  Principal Problem: Fluid overload Active Problems: Tobacco abuse Anxiety Acute diastolic CHF (congestive heart failure) (HCC) Demand ischemia (HCC) Acute respiratory failure with hypoxia (HCC)   1. Acute diastolic congestive heart failure-persistent.IV Lasixincrease to 80mg  twice daily per cardiology on 10/30 and chest x-ray demonstrated some improvement on 10/28.  We will plan to wean oxygen further as tolerated. Still appears to be volume up.Will likely need to be discharged on oral lasix 60mg  BID once ready. 2. Acute respiratory failure with hypoxia-persistent, but slowly improving. Related to CHF exacerbation. Continue to wean down oxygen as toleratedtoday.Currently at 1 L which remains constant from yesterday. 3. Hypokalemia. Recheck levels in a.m. andcontinue with increase to bid potassium supplementation today and check magnesium in am. 4. Demand ischemia. Noted to have mildly elevated troponins. No complaints of chest pain. Echocardiogram does show some wall motion abnormalities, unclear if these are old. Further ischemic work-up per cardiologywith outpatient stress test planned in the near future. Start ASA 81mg  per Cardiology. 5. Anxiety. Continue on Xanax as needed,currently does cut to half due to some somnolence.Currently on 0.25 mg 3 times daily as needed anxiety.Xanax 0.5 mg at bedtime due to difficulty sleeping. 6. Tobacco abuse.Counseled on  cessation. 7. Hyperlipidemia. Continue statin.   DVT prophylaxis:Lovenox Code Status:Full code Family Communication:Discussed with daughter at the bedsideon 10/27 Disposition Plan:Discharge home once improved,hopefully next 24 hours.   Consultants:  Cardiology  Procedures: Echo: - Left ventricle: The cavity size was normal. Mild proximal septal hypertrophy. Systolic function was normal. The estimated ejection fraction was in the range of 50% to 55%. Doppler parameters are consistent with abnormal left ventricular relaxation (grade 1 diastolic dysfunction). Doppler parameters are consistent with high ventricular filling pressure. - Regional wall motion abnormality: Mild hypokinesis of the apical anterior, apical inferior, apical septal, and apical myocardium.  - Aortic valve: There was trivial regurgitation.  Antimicrobials:   None  Subjective: Patient seen and evaluated today with no new acute complaints or concerns. No acute concerns or events noted overnight. She states that her dyspnea is persistent and she still has some trouble with ambulating.   Objective: Vitals:   07/21/18 2027 07/21/18 2159 07/22/18 0601 07/22/18 1048  BP:  104/63 (!) 117/56   Pulse:  (!) 111 92   Resp:  18 18   Temp:  97.7 F (36.5 C) 97.9 F (36.6 C)   TempSrc:  Oral Oral   SpO2: 94% 93% 96% 92%  Weight:   80.7 kg   Height:        Intake/Output Summary (Last 24 hours) at 07/22/2018 1117 Last data filed at 07/22/2018 1052 Gross per 24 hour  Intake -  Output 2250 ml  Net -2250 ml   Filed Weights   07/20/18 0500 07/21/18 0439 07/22/18 0601  Weight: 81.6 kg 80 kg 80.7 kg    Examination:  General exam: Appears calm and comfortable  Respiratory system: Clear to auscultation. Respiratory effort normal. Currently on 1L [place]. Cardiovascular system: S1 & S2 heard, RRR. No JVD, murmurs, rubs, gallops or clicks. No pedal edema. Gastrointestinal system:  Abdomen is nondistended, soft and nontender. No organomegaly or masses felt. Normal bowel sounds heard. Central nervous system: Alert and oriented. No focal neurological deficits. Extremities: Symmetric 5 x 5 power. Skin: No rashes, lesions or ulcers Psychiatry: Judgement and insight appear normal. Mood & affect appropriate.     Data Reviewed: I have personally reviewed following labs and imaging studies  CBC: No results for input(s): WBC, NEUTROABS, HGB, HCT, MCV, PLT in the last 168 hours. Basic Metabolic Panel: Recent Labs  Lab 07/17/18 0603 07/18/18 0600 07/19/18 0559 07/20/18 0509 07/21/18 0521 07/22/18 0422  NA 137 136 137 135 133* 131*  K 3.4* 4.1 4.0 3.7 3.7 3.2*  CL 88* 87* 88* 86* 83* 83*  CO2 39* 38* 37* 31 35* 34*  GLUCOSE 98 94 102* 103* 104* 96  BUN 11 15 15 21 21 20   CREATININE 0.51 0.51 0.50 0.61 0.63 0.51  CALCIUM 8.2* 8.6* 8.9 9.1 9.0 8.8*  MG 1.9 1.8  --   --   --   --    GFR: Estimated Creatinine Clearance: 70.8 mL/min (by C-G formula based on SCr of 0.51 mg/dL). Liver Function Tests: No results for input(s): AST, ALT, ALKPHOS, BILITOT, PROT, ALBUMIN in the last 168 hours. No results for input(s): LIPASE, AMYLASE in the last 168 hours. No results for input(s): AMMONIA in the last 168 hours. Coagulation Profile: No results for input(s): INR, PROTIME in the last 168 hours. Cardiac Enzymes: No results for input(s): CKTOTAL, CKMB, CKMBINDEX, TROPONINI in the last 168 hours. BNP (last 3 results) No results for input(s): PROBNP in the last 8760 hours. HbA1C: No results for input(s): HGBA1C in the last 72 hours. CBG: No results for input(s): GLUCAP in the last 168 hours. Lipid Profile: No results for input(s): CHOL, HDL, LDLCALC, TRIG, CHOLHDL, LDLDIRECT in the last 72 hours. Thyroid Function Tests: No results for input(s): TSH, T4TOTAL, FREET4, T3FREE, THYROIDAB in the last 72 hours. Anemia Panel: No results for input(s): VITAMINB12, FOLATE, FERRITIN,  TIBC, IRON, RETICCTPCT in the last 72 hours. Sepsis Labs: No results for input(s): PROCALCITON, LATICACIDVEN in the last 168 hours.  Recent Results (from the past 240 hour(s))  Urine Culture     Status: Abnormal   Collection Time: 07/13/18  2:04 PM  Result Value Ref Range Status   Specimen Description   Final    URINE, RANDOM Performed at [facility], [address], [place], [place] [address]    Special Requests   Final    NONE Performed at [facility], [address], [place], [place] [address]    Culture (A)  Final    <10,000 COLONIES/mL INSIGNIFICANT GROWTH Performed at [facility], [address] [place], [place] [address]    Report Status 07/15/2018 FINAL  Final         Radiology Studies: No results found.      Scheduled Meds: . ALPRAZolam  0.5 mg Oral QHS  . aspirin EC  81 mg Oral Daily  . enoxaparin (LOVENOX) injection  40 mg Subcutaneous Q24H  . ezetimibe  10 mg Oral QHS  . furosemide  80 mg Intravenous BID  . nicotine  7 mg Transdermal Daily  . potassium chloride  40 mEq Oral Daily  . simvastatin  40 mg Oral QHS   Continuous Infusions:   LOS: 8 days    Time spent: 30 minutes    Cassara Nida Hoover Brunette, DO [facility] Pager [phone]  If 7PM-7AM, please contact night-coverage [email or web address] Password [facility] 07/22/2018,  11:17 AM

## 2018-07-23 DIAGNOSIS — E876 Hypokalemia: Secondary | ICD-10-CM

## 2018-07-23 LAB — BASIC METABOLIC PANEL
Anion gap: 12 (ref 5–15)
BUN: 21 mg/dL (ref 8–23)
CHLORIDE: 86 mmol/L — AB (ref 98–111)
CO2: 33 mmol/L — AB (ref 22–32)
Calcium: 8.9 mg/dL (ref 8.9–10.3)
Creatinine, Ser: 0.65 mg/dL (ref 0.44–1.00)
GFR calc Af Amer: >60 mL/min (ref >60)
GFR calc non Af Amer: >60 mL/min (ref >60)
GLUCOSE: 110 mg/dL — AB (ref 70–99)
POTASSIUM: 3.8 mmol/L (ref 3.5–5.1)
Sodium: 131 mmol/L — ABNORMAL LOW (ref 135–145)

## 2018-07-23 LAB — MAGNESIUM: MAGNESIUM: 1.8 mg/dL (ref 1.7–2.4)

## 2018-07-23 MED ORDER — ASPIRIN 81 MG PO TBEC: 81.0000 mg | DELAYED_RELEASE_TABLET | Freq: Every day | ORAL | 0 refills | Status: AC

## 2018-07-23 MED ORDER — NICOTINE 7 MG/24HR TD PT24: 7.0000 mg | MEDICATED_PATCH | Freq: Every day | TRANSDERMAL | 0 refills | Status: DC

## 2018-07-23 MED ORDER — POTASSIUM CHLORIDE 20 MEQ PO PACK: 30.0000 meq | PACK | Freq: Every day | ORAL | 0 refills | Status: AC

## 2018-07-23 MED ORDER — POLYETHYLENE GLYCOL 3350 17 G PO PACK: 17.0000 g | PACK | Freq: Every day | ORAL | 0 refills | Status: DC | PRN

## 2018-07-23 MED ORDER — FUROSEMIDE 40 MG PO TABS: 60.0000 mg | ORAL_TABLET | Freq: Two times a day (BID) | ORAL | 0 refills | Status: DC

## 2018-07-23 MED ORDER — ALPRAZOLAM 1 MG PO TABS: 0.5000 mg | ORAL_TABLET | Freq: Three times a day (TID) | ORAL | 0 refills | Status: DC | PRN

## 2018-07-23 NOTE — Care Management Important Message (Signed)
Important Message  Patient Details  Name: Nicole Welch MRN: [id number] Date of Birth: October 30, 1955   Medicare Important Message Given:  Yes    Renie Ora 07/23/2018, 2:41 PM

## 2018-07-23 NOTE — Progress Notes (Signed)
IV removed, patient tolerated well.  Reviewed AVS with patient and patient's daughter, both verbalized understanding.  Patient transported home by her daughter.

## 2018-07-23 NOTE — Discharge Instructions (Signed)
Heart Failure Heart failure is a condition in which the heart has trouble pumping blood because it has become weak or stiff. This means that the heart does not pump blood efficiently for the body to work well. For some people with heart failure, fluid may back up into the lungs and there may be swelling (edema) in the lower legs. Heart failure is usually a long-term (chronic) condition. It is important for you to take good care of yourself and follow the treatment plan from your health care provider. What are the causes? This condition is caused by some health problems, including:  High blood pressure (hypertension). Hypertension causes the heart muscle to work harder than normal. High blood pressure eventually causes the heart to become stiff and weak.  Coronary artery disease (CAD). CAD is the buildup of cholesterol and fat (plaques) in the arteries of the heart.  Heart attack (myocardial infarction). Injured tissue, which is caused by the heart attack, does not contract as well and the heart's ability to pump blood is weakened.  Abnormal heart valves. When the heart valves do not open and close properly, the heart muscle must pump harder to keep the blood flowing.  Heart muscle disease (cardiomyopathy or myocarditis). Heart muscle disease is damage to the heart muscle from a variety of causes, such as drug or alcohol abuse, infections, or unknown causes. These can increase the risk of heart failure.  Lung disease. When the lungs do not work properly, the heart must work harder.  What increases the risk? Risk of heart failure increases as a person ages. This condition is also more likely to develop in people who:  Are overweight.  Are female.  Smoke or chew tobacco.  Abuse alcohol or illegal drugs.  Have taken medicines that can damage the heart, such as chemotherapy drugs.  Have diabetes. ? High blood sugar (glucose) is associated with high fat (lipid) levels in the blood. ? Diabetes  can also damage tiny blood vessels that carry nutrients to the heart muscle.  Have abnormal heart rhythms.  Have thyroid problems.  Have low blood counts (anemia).  What are the signs or symptoms? Symptoms of this condition include:  Shortness of breath with activity, such as when climbing stairs.  Persistent cough.  Swelling of the feet, ankles, legs, or abdomen.  Unexplained weight gain.  Difficulty breathing when lying flat (orthopnea).  Waking from sleep because of the need to sit up and get more air.  Rapid heartbeat.  Fatigue and loss of energy.  Feeling light-headed, dizzy, or close to fainting.  Loss of appetite.  Nausea.  Increased urination during the night (nocturia).  Confusion.  How is this diagnosed? This condition is diagnosed based on:  Medical history, symptoms, and a physical exam.  Diagnostic tests, which may include: ? Echocardiogram. ? Electrocardiogram (ECG). ? Chest X-ray. ? Blood tests. ? Exercise stress test. ? Radionuclide scans. ? Cardiac catheterization and angiogram.  How is this treated? Treatment for this condition is aimed at managing the symptoms of heart failure. Medicines, behavioral changes, or other treatments may be necessary to treat heart failure. Medicines These may include:  Angiotensin-converting enzyme (ACE) inhibitors. This type of medicine blocks the effects of a blood protein called angiotensin-converting enzyme. ACE inhibitors relax (dilate) the blood vessels and help to lower blood pressure.  Angiotensin receptor blockers (ARBs). This type of medicine blocks the actions of a blood protein called angiotensin. ARBs dilate the blood vessels and help to lower blood pressure.  Water   pills (diuretics). Diuretics cause the kidneys to remove salt and water from the blood. The extra fluid is removed through urination, leaving a lower volume of blood that the heart has to pump.  Beta blockers. These improve heart  muscle strength and they prevent the heart from beating too quickly.  Digoxin. This increases the force of the heartbeat.  Healthy behavior changes These may include:  Reaching and maintaining a healthy weight.  Stopping smoking or chewing tobacco.  Eating heart-healthy foods.  Limiting or avoiding alcohol.  Stopping use of street drugs (illegal drugs).  Physical activity.  Other treatments These may include:  Surgery to open blocked coronary arteries or repair damaged heart valves.  Placement of a biventricular pacemaker to improve heart muscle function (cardiac resynchronization therapy). This device paces both the right ventricle and left ventricle.  Placement of a device to treat serious abnormal heart rhythms (implantable cardioverter defibrillator, or ICD).  Placement of a device to improve the pumping ability of the heart (left ventricular assist device, or LVAD).  Heart transplant. This can cure heart failure, and it is considered for certain patients who do not improve with other therapies.  Follow these instructions at home: Medicines  Take over-the-counter and prescription medicines only as told by your health care provider. Medicines are important in reducing the workload of your heart, slowing the progression of heart failure, and improving your symptoms. ? Do not stop taking your medicine unless your health care provider told you to do that. ? Do not skip any dose of medicine. ? Refill your prescriptions before you run out of medicine. You need your medicines every day. Eating and drinking   Eat heart-healthy foods. Talk with a dietitian to make an eating plan that is right for you. ? Choose foods that contain no trans fat and are low in saturated fat and cholesterol. Healthy choices include fresh or frozen fruits and vegetables, fish, lean meats, legumes, fat-free or low-fat dairy products, and whole-grain or high-fiber foods. ? Limit salt (sodium) if  directed by your health care provider. Sodium restriction may reduce symptoms of heart failure. Ask a dietitian to recommend heart-healthy seasonings. ? Use healthy cooking methods instead of frying. Healthy methods include roasting, grilling, broiling, baking, poaching, steaming, and stir-frying.  Limit your fluid intake if directed by your health care provider. Fluid restriction may reduce symptoms of heart failure. Lifestyle  Stop smoking or using chewing tobacco. Nicotine and tobacco can damage your heart and your blood vessels. Do not use nicotine gum or patches before talking to your health care provider.  Limit alcohol intake to no more than 1 drink per day for non-pregnant women and 2 drinks per day for men. One drink equals 12 oz of beer, 5 oz of wine, or 1 oz of hard liquor. ? Drinking more than that is harmful to your heart. Tell your health care provider if you drink alcohol several times a week. ? Talk with your health care provider about whether any level of alcohol use is safe for you. ? If your heart has already been damaged by alcohol or you have severe heart failure, drinking alcohol should be stopped completely.  Stop use of illegal drugs.  Lose weight if directed by your health care provider. Weight loss may reduce symptoms of heart failure.  Do moderate physical activity if directed by your health care provider. People who are elderly and people with severe heart failure should consult with a health care provider for physical activity recommendations.   Monitor important information  Weigh yourself every day. Keeping track of your weight daily helps you to notice excess fluid sooner. ? Weigh yourself every morning after you urinate and before you eat breakfast. ? Wear the same amount of clothing each time you weigh yourself. ? Record your daily weight. Provide your health care provider with your weight record.  Monitor and record your blood pressure as told by your health  care provider.  Check your pulse as told by your health care provider. Dealing with extreme temperatures  If the weather is extremely hot: ? Avoid vigorous physical activity. ? Use air conditioning or fans or seek a cooler location. ? Avoid caffeine and alcohol. ? Wear loose-fitting, lightweight, and light-colored clothing.  If the weather is extremely cold: ? Avoid vigorous physical activity. ? Layer your clothes. ? Wear mittens or gloves, a hat, and a scarf when you go outside. ? Avoid alcohol. General instructions  Manage other health conditions such as hypertension, diabetes, thyroid disease, or abnormal heart rhythms as told by your health care provider.  Learn to manage stress. If you need help to do this, ask your health care provider.  Plan rest periods when fatigued.  Get ongoing education and support as needed.  Participate in or seek rehabilitation as needed to maintain or improve independence and quality of life.  Stay up to date with immunizations. Keeping current on pneumococcal and influenza immunizations is especially important to prevent respiratory infections.  Keep all follow-up visits as told by your health care provider. This is important. Contact a health care provider if:  You have a rapid weight gain.  You have increasing shortness of breath that is unusual for you.  You are unable to participate in your usual physical activities.  You tire easily.  You cough more than normal, especially with physical activity.  You have any swelling or more swelling in areas such as your hands, feet, ankles, or abdomen.  You are unable to sleep because it is hard to breathe.  You feel like your heart is beating quickly (palpitations).  You become dizzy or light-headed when you stand up. Get help right away if:  You have difficulty breathing.  You notice or your family notices a change in your awareness, such as having trouble staying awake or having  difficulty with concentration.  You have pain or discomfort in your chest.  You have an episode of fainting (syncope). This information is not intended to replace advice given to you by your health care provider. Make sure you discuss any questions you have with your health care provider. Document Released: 09/08/2005 Document Revised: 05/13/2016 Document Reviewed: 04/02/2016 [employer] Interactive Patient Education  2018 [employer]  

## 2018-07-23 NOTE — Progress Notes (Signed)
Progress Note  Patient Name: Nicole Welch Date of Encounter: 07/23/2018  Primary Cardiologist: Prentice Docker, MD   Subjective   Feels good this AM  Inpatient Medications    Scheduled Meds: . ALPRAZolam  0.5 mg Oral QHS  . aspirin EC  81 mg Oral Daily  . enoxaparin (LOVENOX) injection  40 mg Subcutaneous Q24H  . ezetimibe  10 mg Oral QHS  . furosemide  80 mg Intravenous BID  . nicotine  7 mg Transdermal Daily  . potassium chloride  40 mEq Oral BID  . simvastatin  40 mg Oral QHS   Continuous Infusions:  PRN Meds: acetaminophen **OR** acetaminophen, ALPRAZolam, ipratropium-albuterol, oxyCODONE-acetaminophen, polyethylene glycol, sodium chloride   Vital Signs    Vitals:   07/22/18 1952 07/22/18 2209 07/23/18 0554 07/23/18 0600  BP:  116/78 115/61   Pulse:  (!) 111 99   Resp:      Temp:  97.6 F (36.4 C) (!) 97.5 F (36.4 C)   TempSrc:  Oral Oral   SpO2: (!) 86% 93% 93%   Weight:    80.9 kg  Height:        Intake/Output Summary (Last 24 hours) at 07/23/2018 0900 Last data filed at 07/23/2018 0843 Gross per 24 hour  Intake 720 ml  Output 750 ml  Net -30 ml   Filed Weights   07/21/18 0439 07/22/18 0601 07/23/18 0600  Weight: 80 kg 80.7 kg 80.9 kg    Telemetry    Sinus tach- Personally Reviewed  ECG    na  Physical Exam   GEN: No acute distress.   Neck:mildly elevated jvd Cardiac: RRR, no murmurs, rubs, or gallops.  Respiratory: Clear to auscultation bilaterally. GI: Soft, nontender, non-distended  MS: No edema; No deformity. Neuro:  Nonfocal  Psych: Normal affect   Labs    Chemistry Recent Labs  Lab 07/21/18 0521 07/22/18 0422 07/23/18 0516  NA 133* 131* 131*  K 3.7 3.2* 3.8  CL 83* 83* 86*  CO2 35* 34* 33*  GLUCOSE 104* 96 110*  BUN 21 20 21   CREATININE 0.63 0.51 0.65  CALCIUM 9.0 8.8* 8.9  GFRNONAA >60 >60 >60  GFRAA >60 >60 >60  ANIONGAP 15 14 12      HematologyNo results for input(s): WBC, RBC, HGB, HCT, MCV, MCH,  MCHC, RDW, PLT in the last 168 hours.  Cardiac EnzymesNo results for input(s): TROPONINI in the last 168 hours. No results for input(s): TROPIPOC in the last 168 hours.   BNPNo results for input(s): BNP, PROBNP in the last 168 hours.   DDimer No results for input(s): DDIMER in the last 168 hours.   Radiology    No results found.  Cardiac Studies    Patient Profile     62 y.o. female 62 y.o.femalew/ PMHof HLD, anxiety, chronic back pain, tobacco use and family history of CADwho presented to [facility] ED on 07/13/2018 for evaluation of weakness, malaise, and dyspnea. Found to have an elevated BNP of 1970 and minimally elevated troponin values (0.12 -->0.13 -->0.14). Cardiology consulted for further management.  Assessment & Plan    1. Acute diastolic HF - 06/2018 echo LVEF 50-55%, grade I diastolic dysfunction, mild apical hypokinesis - I/Os are incompletethis admission.RepeatCXR with improving edema.She is on lasix 80mg  IV bid which was increased yesterday, stable renal function. From weights down 89.8 kg to 80.9 kg, nearly 20 lbs  - mild uptrend in Cr but will within normal limits. I/Os incomplete from overnight.  - She  came in severely volume overloaded with over 20 lbs of diuresis thus far, requiring extended admission for diuresis.  - ok for discahrge pending O2 sats today. If remains hypoxic with ambulation, would dose IV lasix 80mg  tid today and reevaluate tomorrow AM. Would plan for lasix 60mg  bid when changed to oral regimen.    2. Elevated troponin - mild flat elevation peak 0.14 thought to be demand ischemia in setting of diastolic HF from Dr Junius Argyle original evaluation - does have some apical hypokinesis on echo. Perhaps has some chronic obstructive CAD. Clinical picture not consistent with acute obstructive disease - plan is for outpatient nuclear stress test.     For questions or updates, please contact [facility] Please consult  [email or web address] for contact info under        Signed, Dina Rich, MD  07/23/2018, 9:00 AM

## 2018-07-23 NOTE — Care Management Note (Signed)
Case Management Note  Patient Details  Name: Nicole Welch MRN: [id number] Date of Birth: Jan 31, 1956  Subjective/Objective:                    Action/Plan: Met with patient to discuss [place] with her. She is still agreeable. Tim of [place] notified of potential DC tomorrow. Patient up in room, on room air. Expected Discharge Date:                  Expected Discharge Plan:  [place]  In-House Referral:     Discharge planning Services  CM Consult  Post Acute Care Choice:  Home Health Choice offered to:  Patient  DME Arranged:    DME Agency:     HH Arranged:  RN, Disease Management [place] Agency:  [facility] (now [facility])  Status of Service:  In process, will continue to follow  If discussed at Long Length of Stay Meetings, dates discussed:    Additional Comments:  Zyeir Dymek, Chauncey Reading, RN 07/23/2018, 3:04 PM

## 2018-07-23 NOTE — Progress Notes (Signed)
SATURATION QUALIFICATIONS: (This note is used to comply with regulatory documentation for home oxygen)  Patient Saturations on Room Air at Rest = 95%  Patient Saturations on Room Air while Ambulating = 92-93%  Please briefly explain why patient needs home oxygen: patient does not require supplemental oxygen to maintain O2 sats above 92% while ambulating or at rest.

## 2018-07-23 NOTE — Discharge Summary (Signed)
Physician Discharge Summary  Nicole Welch RUE:[id number] DOB: January 19, 1956 DOA: 07/13/2018  PCP: Elfredia Nevins, MD  Admit date: 07/13/2018 Discharge date: 07/23/2018  Admitted From: home Disposition:  home  Recommendations for Outpatient Follow-up:  1. Follow up with PCP in 1-2 weeks 2. Cardiology will on his outpatient follow-up with plan on nuclear stress test.  Home Health:RN Equipment/Devices:none  Discharge Condition: stable CODE STATUS:full code Diet recommendation: heart healthy    Discharge Diagnoses:  Principal Problem:   Acute diastolic CHF (congestive heart failure) (HCC)   Active Problems:   Fluid overload   Tobacco abuse   Anxiety   Demand ischemia (HCC)   Acute respiratory failure with hypoxia (HCC)    Brief narrative/history of present illness Please refer to admission H&P for details, in brief, 62 year old female presented to the hospital with shortness of breath and generalized weakness and was found to have decompensated CHF with acute hypoxic respiratory failure. Also had  mildly elevated troponin. 2-D echo showed findings of acute diastolic CHF with reasonable wall motion abnormality showing mild hypokinesis of the apical anterior, apical inferior and apical septal and apical myocardium. cardiology involved in care and patient diuresed with IV Lasix.  Hospital course Acute diastolic CHF(HCC) Diabetes on IV Lasix 80 mg every 12 hours with significant improvement and with down by almost 20 pounds. Has been weaned off oxygen today and maintaining sats greater than 93%on room air both at rest and on ambulation. Added baby aspirin. Patient counseled on diet and medication adherence, monitoring with daily. Home health RN arranged. Cartilage consulted appreciated. Patient was discharged on oral Lasix 60 mg every 12 hours along with potassium supplement. Outpatient follow-up with cardiology.  Acute respiratory failure with hypoxia (HCC) Secondary to  CHF. Oxygen weaned and now maintaining sats on room air.   Dementia ischemia with wall motion abnormality As seen on echo with apical hypokinesis. Cartilage plan on outpatient nuclear stress test. Added baby aspirin. Continue statin.  Anxiety On chronic Xanax. Dose has been reduced due to concern for somnolence.   Tobacco abuse Counseled on cessation. Order nicotine patch  Hyperlipidemia Continue statin  Hypokalemia Replenished  Procedure: 2-D echo Consults: Cardiology Family communication: None at bedside   Discharge Instructions   Allergies as of 07/23/2018   No Known Allergies     Medication List    TAKE these medications   alendronate 70 MG tablet Commonly known as:  FOSAMAX Take 70 mg by mouth every Tuesday. Take with a full glass of water on an empty stomach. Takes on Saturdays   ALPRAZolam 1 MG tablet Commonly known as:  XANAX Take 0.5 tablets (0.5 mg total) by mouth 3 (three) times daily as needed for anxiety. anxiety What changed:    how much to take  when to take this  reasons to take this   aspirin 81 MG EC tablet Take 1 tablet (81 mg total) by mouth daily. Start taking on:  07/24/2018   Calcium + D3 600-200 MG-UNIT Tabs Take 1 tablet by mouth daily.   ezetimibe 10 MG tablet Commonly known as:  ZETIA Take 10 mg by mouth at bedtime.   furosemide 40 MG tablet Commonly known as:  LASIX Take 1.5 tablets (60 mg total) by mouth 2 (two) times daily.   meloxicam 15 MG tablet Commonly known as:  MOBIC Take 15 mg by mouth daily as needed for pain.   nicotine 7 mg/24hr patch Commonly known as:  NICODERM CQ - dosed in mg/24 hr Place 1 patch (  7 mg total) onto the skin daily. Start taking on:  07/24/2018   oxyCODONE-acetaminophen 5-325 MG tablet Commonly known as:  PERCOCET/ROXICET Take 1 tablet by mouth every 6 (six) hours as needed. Back pain   polyethylene glycol packet Commonly known as:  MIRALAX / GLYCOLAX Take 17 g by mouth daily as  needed for mild constipation.   potassium chloride 20 MEQ packet Commonly known as:  KLOR-CON Take 30 mEq by mouth daily.   simvastatin 40 MG tablet Commonly known as:  ZOCOR Take 40 mg by mouth at bedtime.      Follow-up Information    [facility] up.   Specialty:  Home Health Services Why:  home health RN (heart failure program)- someone will contact you to schedule first visit Contact information: [address] [place] [address] [place] [address] [phone]        Elfredia Nevins, MD. Schedule an appointment as soon as possible for a visit in 1 week(s).   Specialty:  Internal Medicine Contact information: [address] [place] [address] [phone]        Laqueta Linden, MD. Schedule an appointment as soon as possible for a visit in 3 week(s).   Specialty:  Cardiology Contact information: [address] [place] [address] [phone]          No Known Allergies      Procedures/Studies: Dg Chest 2 View  Result Date: 07/19/2018 CLINICAL DATA:  Short of breath EXAM: CHEST - 2 VIEW COMPARISON:  07/11/2018 FINDINGS: Moderate cardiomegaly. Vascular congestion. Pulmonary edema has improved. Bilateral pleural effusions have improved. No pneumothorax. IMPRESSION: Improved pulmonary edema and bilateral pleural effusions. Vascular congestion and mild interstitial edema persist. Electronically Signed   By: Jolaine Click M.D.   On: 07/19/2018 08:51   Dg Chest 2 View  Result Date: 07/13/2018 CLINICAL DATA:  Cough, congestion EXAM: CHEST - 2 VIEW COMPARISON:  04/22/2014 FINDINGS: There is bilateral diffuse mild interstitial thickening. There is a trace right pleural effusion. There is no left pleural effusion. There is no pneumothorax. There is stable cardiomegaly. There is no acute osseous abnormality. IMPRESSION: Findings most concerning for mild CHF. Electronically Signed   By: Elige Ko   On: 07/13/2018 15:14    2-D echo Study  Conclusions  - Procedure narrative: Transthoracic echocardiography. Image   quality was adequate. Intravenous contrast (Definity) was   administered. - Left ventricle: The cavity size was normal. Mild proximal septal   hypertrophy. Systolic function was normal. The estimated ejection   fraction was in the range of 50% to 55%. Doppler parameters are   consistent with abnormal left ventricular relaxation (grade 1   diastolic dysfunction). Doppler parameters are consistent with   high ventricular filling pressure. - Regional wall motion abnormality: Mild hypokinesis of the apical   anterior, apical inferior, apical septal, and apical myocardium. - Aortic valve: There was trivial regurgitation.  Impressions:  - Apical endocardial hypokinesis suggestive of left anterior   descending coronary artery disease.   Subjective: Reports meeting to be much better and continues to diurse well.  Discharge Exam: Vitals:   07/23/18 0554 07/23/18 1300  BP: 115/61 127/88  Pulse: 99 (!) 112  Resp:  18  Temp: (!) 97.5 F (36.4 C) 98.1 F (36.7 C)  SpO2: 93% 92%   Vitals:   07/22/18 2209 07/23/18 0554 07/23/18 0600 07/23/18 1300  BP: 116/78 115/61  127/88  Pulse: (!) 111 99  (!) 112  Resp:  18  Temp: 97.6 F (36.4 C) (!) 97.5 F (36.4 C)  98.1 F (36.7 C)  TempSrc: Oral Oral  Oral  SpO2: 93% 93%  92%  Weight:   80.9 kg   Height:        General: not in distress HEENT: Moist, supple neck, no JVD Chest: Clear bilaterally CVS: Normal S1 and S2, no murmurs GI: Soft, nondistended, nontender musculoskeletal: Warm, no edema     The results of significant diagnostics from this hospitalization (including imaging, microbiology, ancillary and laboratory) are listed below for reference.     Microbiology: No results found for this or any previous visit (from the past 240 hour(s)).   Labs: BNP (last 3 results) Recent Labs    07/13/18 1403  BNP 1,970.0*   Basic Metabolic  Panel: Recent Labs  Lab 07/17/18 0603 07/18/18 0600 07/19/18 0559 07/20/18 0509 07/21/18 0521 07/22/18 0422 07/23/18 0516  NA 137 136 137 135 133* 131* 131*  K 3.4* 4.1 4.0 3.7 3.7 3.2* 3.8  CL 88* 87* 88* 86* 83* 83* 86*  CO2 39* 38* 37* 31 35* 34* 33*  GLUCOSE 98 94 102* 103* 104* 96 110*  BUN 11 15 15 21 21 20 21   CREATININE 0.51 0.51 0.50 0.61 0.63 0.51 0.65  CALCIUM 8.2* 8.6* 8.9 9.1 9.0 8.8* 8.9  MG 1.9 1.8  --   --   --   --  1.8   Liver Function Tests: No results for input(s): AST, ALT, ALKPHOS, BILITOT, PROT, ALBUMIN in the last 168 hours. No results for input(s): LIPASE, AMYLASE in the last 168 hours. No results for input(s): AMMONIA in the last 168 hours. CBC: No results for input(s): WBC, NEUTROABS, HGB, HCT, MCV, PLT in the last 168 hours. Cardiac Enzymes: No results for input(s): CKTOTAL, CKMB, CKMBINDEX, TROPONINI in the last 168 hours. BNP: Invalid input(s): POCBNP CBG: No results for input(s): GLUCAP in the last 168 hours. D-Dimer No results for input(s): DDIMER in the last 72 hours. Hgb A1c No results for input(s): HGBA1C in the last 72 hours. Lipid Profile No results for input(s): CHOL, HDL, LDLCALC, TRIG, CHOLHDL, LDLDIRECT in the last 72 hours. Thyroid function studies No results for input(s): TSH, T4TOTAL, T3FREE, THYROIDAB in the last 72 hours.  Invalid input(s): FREET3 Anemia work up No results for input(s): VITAMINB12, FOLATE, FERRITIN, TIBC, IRON, RETICCTPCT in the last 72 hours. Urinalysis    Component Value Date/Time   COLORURINE YELLOW 07/13/2018 1404   APPEARANCEUR HAZY (A) 07/13/2018 1404   LABSPEC 1.004 (L) 07/13/2018 1404   PHURINE 7.0 07/13/2018 1404   GLUCOSEU NEGATIVE 07/13/2018 1404   HGBUR SMALL (A) 07/13/2018 1404   BILIRUBINUR NEGATIVE 07/13/2018 1404   KETONESUR NEGATIVE 07/13/2018 1404   PROTEINUR NEGATIVE 07/13/2018 1404   UROBILINOGEN 0.2 04/22/2014 1810   NITRITE NEGATIVE 07/13/2018 1404   LEUKOCYTESUR MODERATE (A)  07/13/2018 1404   Sepsis Labs Invalid input(s): PROCALCITONIN,  WBC,  LACTICIDVEN Microbiology No results found for this or any previous visit (from the past 240 hour(s)).   Time coordinating discharge: 35 minutes  SIGNED:   Eddie North, MD  [facility] 07/23/2018, 4:30 PM Pager   If 7PM-7AM, please contact night-coverage [email or web address] Password [email or web address]

## 2018-07-28 DIAGNOSIS — M5030 Other cervical disc degeneration, unspecified cervical region: Secondary | ICD-10-CM | POA: Diagnosis not present

## 2018-07-28 DIAGNOSIS — F17210 Nicotine dependence, cigarettes, uncomplicated: Secondary | ICD-10-CM | POA: Diagnosis not present

## 2018-07-28 DIAGNOSIS — I5020 Unspecified systolic (congestive) heart failure: Secondary | ICD-10-CM | POA: Diagnosis not present

## 2018-07-28 DIAGNOSIS — I5031 Acute diastolic (congestive) heart failure: Secondary | ICD-10-CM | POA: Diagnosis not present

## 2018-07-28 DIAGNOSIS — F419 Anxiety disorder, unspecified: Secondary | ICD-10-CM | POA: Diagnosis not present

## 2018-07-28 DIAGNOSIS — E7849 Other hyperlipidemia: Secondary | ICD-10-CM | POA: Diagnosis not present

## 2018-07-28 DIAGNOSIS — I248 Other forms of acute ischemic heart disease: Secondary | ICD-10-CM | POA: Diagnosis not present

## 2018-07-28 DIAGNOSIS — M810 Age-related osteoporosis without current pathological fracture: Secondary | ICD-10-CM | POA: Diagnosis not present

## 2018-07-28 DIAGNOSIS — F329 Major depressive disorder, single episode, unspecified: Secondary | ICD-10-CM | POA: Diagnosis not present

## 2018-07-29 DIAGNOSIS — E876 Hypokalemia: Secondary | ICD-10-CM | POA: Diagnosis not present

## 2018-07-29 DIAGNOSIS — E663 Overweight: Secondary | ICD-10-CM | POA: Diagnosis not present

## 2018-07-29 DIAGNOSIS — I509 Heart failure, unspecified: Secondary | ICD-10-CM | POA: Diagnosis not present

## 2018-07-29 DIAGNOSIS — Z6829 Body mass index (BMI) 29.0-29.9, adult: Secondary | ICD-10-CM | POA: Diagnosis not present

## 2018-07-29 DIAGNOSIS — F419 Anxiety disorder, unspecified: Secondary | ICD-10-CM | POA: Diagnosis not present

## 2018-07-29 DIAGNOSIS — I5032 Chronic diastolic (congestive) heart failure: Secondary | ICD-10-CM | POA: Diagnosis not present

## 2018-07-30 ENCOUNTER — Other Ambulatory Visit: Payer: Self-pay | Admitting: *Deleted

## 2018-07-30 NOTE — Patient Outreach (Signed)
[facility] [place] [facility]) Care Management  07/30/2018  KANON COLUNGA 1955/12/16 [id number]   EMMI-general discharge,  RED ON EMMI ALERT Day # 4 Date: 07/29/18 1636  Red Alert Reason: other questions/problems? Yes  Sad/hopeless/anxious/empty? Yes    Outreach attempt # 1 No answer. [facility] RN CM left HIPAA compliant voicemail message along with CM's contact info.   Plan: [facility] RN CM sent an unsuccessful outreach letter and scheduled this patient for another call attempt within 4 business days   Keva Darty L. Noelle Penner, RN, BSN, CCM [facility] Telephonic Care Management Care Coordinator Office number [phone] Mobile number [phone]  Main [facility] number [phone] Fax number [phone]

## 2018-08-02 ENCOUNTER — Other Ambulatory Visit: Payer: Self-pay | Admitting: *Deleted

## 2018-08-02 DIAGNOSIS — Z1211 Encounter for screening for malignant neoplasm of colon: Secondary | ICD-10-CM | POA: Diagnosis not present

## 2018-08-02 NOTE — Patient Outreach (Signed)
[facility]) Care Management  08/02/2018  Nicole Welch 08-17-1956 [id number]   EMMI-general discharge,  RED ON EMMI ALERT Day # 4 Date: 07/29/18 1636  Red Alert Reason: other questions/problems? Yes  Sad/hopeless/anxious/empty? Yes    Outreach attempt # 2 successful  Patient is able to verify HIPAA Reviewed and addressed referral to [facility] with patient  Nicole Welch voiced concern about the EMMI questioning  She reports the answers are not correct  She denies having other questions/problems  She reports "I feel a little anxious cause I have anxiety at times but there is nothing you can do about that. CM reviewed [facility] SW services and encouraged her to contact CM if services would be needed.   She confirms she weighs daily and documents it Her last wt with 171 lbs She reports purchasing brand new scales ($60+) Her wt at her primary care DM office was 170 lb on 07/29/18 She and CM discussed the listed wt for her in the MD d/c summary as 178 lbs Cm commended her and provided praise for her decreasing her wt  She states she is following her low sodium heart healthy diet with the assistance of her daughter  Wt qd   Fluid restriction is being followed  She reports that her home health RN is to visit again this week to apply a vest and had plan to go back over diet information with her She reports she is keeping a food diary   Social: This patient lives her husband and daughter Her daughter assist with transportation but Nicole Welch is able to drive.  She is independent/assist with her care.  Nicole Welch also reports she relies on her faith and prayer    Conditions: diastolic CHF, demand ischemia, closed fx of calcaneus, UTI, tobacco abuse, anxiety, acute bronchitis, hyperlipidemia   Medications:denies concerns with taking medications as prescribed, affording medications, side effects of medications and questions about medications  Appointments: seen Dr  Sherwood Gambler MD last Thursday 07/29/18  Advance Directives: Denies need for assist with advance directives    Consent: [facility] RN CM reviewed [facility] services with patient. Patient gave verbal consent for services.    Plan: [facility] RN CM will close case at this time as patient has been assessed and no needs identified.   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 Demographics for pt given to [employer] L. Noelle Penner, RN, BSN, CCM [facility] Telephonic Care Management Care Coordinator Office number [phone] Mobile number [phone]  Main [facility] number [phone] Fax number [phone]

## 2018-08-22 DIAGNOSIS — I5031 Acute diastolic (congestive) heart failure: Secondary | ICD-10-CM | POA: Diagnosis not present

## 2018-08-22 DIAGNOSIS — I248 Other forms of acute ischemic heart disease: Secondary | ICD-10-CM | POA: Diagnosis not present

## 2018-08-22 DIAGNOSIS — I5020 Unspecified systolic (congestive) heart failure: Secondary | ICD-10-CM | POA: Diagnosis not present

## 2018-08-22 DIAGNOSIS — M5030 Other cervical disc degeneration, unspecified cervical region: Secondary | ICD-10-CM | POA: Diagnosis not present

## 2018-08-24 DIAGNOSIS — I5020 Unspecified systolic (congestive) heart failure: Secondary | ICD-10-CM | POA: Diagnosis not present

## 2018-08-24 DIAGNOSIS — E7849 Other hyperlipidemia: Secondary | ICD-10-CM | POA: Diagnosis not present

## 2018-08-24 DIAGNOSIS — F419 Anxiety disorder, unspecified: Secondary | ICD-10-CM | POA: Diagnosis not present

## 2018-08-24 DIAGNOSIS — F17210 Nicotine dependence, cigarettes, uncomplicated: Secondary | ICD-10-CM | POA: Diagnosis not present

## 2018-08-24 DIAGNOSIS — I5031 Acute diastolic (congestive) heart failure: Secondary | ICD-10-CM | POA: Diagnosis not present

## 2018-08-24 DIAGNOSIS — I248 Other forms of acute ischemic heart disease: Secondary | ICD-10-CM | POA: Diagnosis not present

## 2018-08-24 DIAGNOSIS — M5030 Other cervical disc degeneration, unspecified cervical region: Secondary | ICD-10-CM | POA: Diagnosis not present

## 2018-08-24 DIAGNOSIS — F329 Major depressive disorder, single episode, unspecified: Secondary | ICD-10-CM | POA: Diagnosis not present

## 2018-08-24 DIAGNOSIS — M810 Age-related osteoporosis without current pathological fracture: Secondary | ICD-10-CM | POA: Diagnosis not present

## 2018-08-26 DIAGNOSIS — E7849 Other hyperlipidemia: Secondary | ICD-10-CM | POA: Diagnosis not present

## 2018-08-26 DIAGNOSIS — M5030 Other cervical disc degeneration, unspecified cervical region: Secondary | ICD-10-CM | POA: Diagnosis not present

## 2018-08-26 DIAGNOSIS — I5031 Acute diastolic (congestive) heart failure: Secondary | ICD-10-CM | POA: Diagnosis not present

## 2018-08-26 DIAGNOSIS — M810 Age-related osteoporosis without current pathological fracture: Secondary | ICD-10-CM | POA: Diagnosis not present

## 2018-08-26 DIAGNOSIS — I248 Other forms of acute ischemic heart disease: Secondary | ICD-10-CM | POA: Diagnosis not present

## 2018-08-26 DIAGNOSIS — F329 Major depressive disorder, single episode, unspecified: Secondary | ICD-10-CM | POA: Diagnosis not present

## 2018-08-26 DIAGNOSIS — F419 Anxiety disorder, unspecified: Secondary | ICD-10-CM | POA: Diagnosis not present

## 2018-08-26 DIAGNOSIS — I5020 Unspecified systolic (congestive) heart failure: Secondary | ICD-10-CM | POA: Diagnosis not present

## 2018-08-26 DIAGNOSIS — F17210 Nicotine dependence, cigarettes, uncomplicated: Secondary | ICD-10-CM | POA: Diagnosis not present

## 2018-08-30 DIAGNOSIS — E7849 Other hyperlipidemia: Secondary | ICD-10-CM | POA: Diagnosis not present

## 2018-08-30 DIAGNOSIS — F419 Anxiety disorder, unspecified: Secondary | ICD-10-CM | POA: Diagnosis not present

## 2018-08-30 DIAGNOSIS — M810 Age-related osteoporosis without current pathological fracture: Secondary | ICD-10-CM | POA: Diagnosis not present

## 2018-08-30 DIAGNOSIS — F329 Major depressive disorder, single episode, unspecified: Secondary | ICD-10-CM | POA: Diagnosis not present

## 2018-08-30 DIAGNOSIS — I5031 Acute diastolic (congestive) heart failure: Secondary | ICD-10-CM | POA: Diagnosis not present

## 2018-08-30 DIAGNOSIS — I5020 Unspecified systolic (congestive) heart failure: Secondary | ICD-10-CM | POA: Diagnosis not present

## 2018-08-30 DIAGNOSIS — F17210 Nicotine dependence, cigarettes, uncomplicated: Secondary | ICD-10-CM | POA: Diagnosis not present

## 2018-08-30 DIAGNOSIS — M5030 Other cervical disc degeneration, unspecified cervical region: Secondary | ICD-10-CM | POA: Diagnosis not present

## 2018-08-30 DIAGNOSIS — I248 Other forms of acute ischemic heart disease: Secondary | ICD-10-CM | POA: Diagnosis not present

## 2018-09-09 DIAGNOSIS — M5030 Other cervical disc degeneration, unspecified cervical region: Secondary | ICD-10-CM | POA: Diagnosis not present

## 2018-09-09 DIAGNOSIS — I248 Other forms of acute ischemic heart disease: Secondary | ICD-10-CM | POA: Diagnosis not present

## 2018-09-09 DIAGNOSIS — F17210 Nicotine dependence, cigarettes, uncomplicated: Secondary | ICD-10-CM | POA: Diagnosis not present

## 2018-09-09 DIAGNOSIS — I5020 Unspecified systolic (congestive) heart failure: Secondary | ICD-10-CM | POA: Diagnosis not present

## 2018-09-09 DIAGNOSIS — E7849 Other hyperlipidemia: Secondary | ICD-10-CM | POA: Diagnosis not present

## 2018-09-09 DIAGNOSIS — I5031 Acute diastolic (congestive) heart failure: Secondary | ICD-10-CM | POA: Diagnosis not present

## 2018-09-09 DIAGNOSIS — F419 Anxiety disorder, unspecified: Secondary | ICD-10-CM | POA: Diagnosis not present

## 2018-09-09 DIAGNOSIS — F329 Major depressive disorder, single episode, unspecified: Secondary | ICD-10-CM | POA: Diagnosis not present

## 2018-09-09 DIAGNOSIS — M810 Age-related osteoporosis without current pathological fracture: Secondary | ICD-10-CM | POA: Diagnosis not present

## 2018-09-13 ENCOUNTER — Ambulatory Visit: Payer: Medicare HMO | Admitting: Cardiology

## 2018-09-13 ENCOUNTER — Encounter: Payer: Self-pay | Admitting: Cardiology

## 2018-09-13 ENCOUNTER — Encounter: Payer: Self-pay | Admitting: *Deleted

## 2018-09-13 VITALS — BP 122/64 | HR 86 | Ht 63.0 in | Wt 176.0 lb

## 2018-09-13 DIAGNOSIS — R931 Abnormal findings on diagnostic imaging of heart and coronary circulation: Secondary | ICD-10-CM | POA: Diagnosis not present

## 2018-09-13 DIAGNOSIS — I2510 Atherosclerotic heart disease of native coronary artery without angina pectoris: Secondary | ICD-10-CM

## 2018-09-13 DIAGNOSIS — I248 Other forms of acute ischemic heart disease: Secondary | ICD-10-CM

## 2018-09-13 DIAGNOSIS — I5031 Acute diastolic (congestive) heart failure: Secondary | ICD-10-CM

## 2018-09-13 NOTE — Assessment & Plan Note (Signed)
Slightly elevated troponin felt to be demand ischemia from acute diastolic CHF- will need to r/o CAD- check Myoview

## 2018-09-13 NOTE — Progress Notes (Signed)
09/13/2018 Nicole SauerDebra P Welch   1956/02/08  [id number]  Primary Physician Elfredia NevinsFusco, Lawrence, MD Primary Cardiologist: Dr Purvis SheffieldKoneswaran  HPI: Patient is a pleasant 62 year old female, seen in the office today after a recent admission with acute diastolic congestive heart failure.  The patient was admitted July 13, 2018 and discharged July 23, 2018.  She presented with dyspnea and respiratory failure.  Her admission weight was 197 pounds, at discharge she was down to 178 pounds.  Echocardiogram showed an ejection fraction of 50 to 55% although she did have apical hypokinesis.  Her troponins were also slightly elevated at 0.14.  It was felt that the troponin elevation was most likely secondary to demand ischemia from her diastolic heart failure but with the apical wall motion abnormality on her echo cardiogram there was some concern for LAD disease.  The patient is seen in the office today in follow-up.  She has been doing well since discharge.  She denies any chest pain or shortness of breath.  She has been watching her diet and monitoring her weights.  She has not had problems with her medications.   Current Outpatient Medications  Medication Sig Dispense Refill  . alendronate (FOSAMAX) 70 MG tablet Take 70 mg by mouth every Tuesday. Take with a full glass of water on an empty stomach. Takes on Saturdays     . ALPRAZolam (XANAX) 1 MG tablet Take 0.5 tablets (0.5 mg total) by mouth 3 (three) times daily as needed for anxiety. anxiety 5 tablet 0  . aspirin EC 81 MG EC tablet Take 1 tablet (81 mg total) by mouth daily. 30 tablet 0  . Calcium Carb-Cholecalciferol (CALCIUM + D3) 600-200 MG-UNIT TABS Take 1 tablet by mouth daily.     Marland Kitchen. ezetimibe (ZETIA) 10 MG tablet Take 10 mg by mouth at bedtime.    . furosemide (LASIX) 40 MG tablet Take 1.5 tablets (60 mg total) by mouth 2 (two) times daily. 90 tablet 0  . meloxicam (MOBIC) 15 MG tablet Take 15 mg by mouth daily as needed for pain.     .  nicotine (NICODERM CQ - DOSED IN MG/24 HR) 7 mg/24hr patch Place 1 patch (7 mg total) onto the skin daily. 28 patch 0  . oxyCODONE-acetaminophen (PERCOCET) 5-325 MG per tablet Take 1 tablet by mouth every 6 (six) hours as needed. Back pain    . polyethylene glycol (MIRALAX / GLYCOLAX) packet Take 17 g by mouth daily as needed for mild constipation. 14 each 0  . potassium chloride (KLOR-CON) 20 MEQ packet Take 30 mEq by mouth daily. 20 tablet 0  . simvastatin (ZOCOR) 40 MG tablet Take 40 mg by mouth at bedtime.     No current facility-administered medications for this visit.     No Known Allergies  Past Medical History:  Diagnosis Date  . Anxiety   . Back pain   . Chronic neck pain   . DDD (degenerative disc disease), cervical   . Depression   . DJD (degenerative joint disease)   . Gait abnormality   . Osteoporosis     Social History   Socioeconomic History  . Marital status: Married    Spouse name: Not on file  . Number of children: Not on file  . Years of education: ged   . Highest education level: Not on file  Occupational History  . Occupation: Set designermanufacturing work   Engineer, productionocial Needs  . Financial resource strain: Not on file  . Food insecurity:  Worry: Not on file    Inability: Not on file  . Transportation needs:    Medical: Not on file    Non-medical: Not on file  Tobacco Use  . Smoking status: Former Smoker    Packs/day: 1.00    Types: Cigarettes  . Smokeless tobacco: Never Used  Substance and Sexual Activity  . Alcohol use: No  . Drug use: No  . Sexual activity: Not on file  Lifestyle  . Physical activity:    Days per week: Not on file    Minutes per session: Not on file  . Stress: Not on file  Relationships  . Social connections:    Talks on phone: Not on file    Gets together: Not on file    Attends religious service: Not on file    Active member of club or organization: Not on file    Attends meetings of clubs or organizations: Not on file     Relationship status: Not on file  . Intimate partner violence:    Fear of current or ex partner: Not on file    Emotionally abused: Not on file    Physically abused: Not on file    Forced sexual activity: Not on file  Other Topics Concern  . Not on file  Social History Narrative  . Not on file     Family History  Problem Relation Age of Onset  . Heart disease Other        family history   . Arthritis Other        family history   . CAD Father        2360's     Review of Systems: General: negative for chills, fever, night sweats or weight changes.  Cardiovascular: negative for chest pain, dyspnea on exertion, edema, orthopnea, palpitations, paroxysmal nocturnal dyspnea or shortness of breath Dermatological: negative for rash Respiratory: negative for cough or wheezing Urologic: negative for hematuria Abdominal: negative for nausea, vomiting, diarrhea, bright red blood per rectum, melena, or hematemesis Neurologic: negative for visual changes, syncope, or dizziness All other systems reviewed and are otherwise negative except as noted above.    Blood pressure 122/64, pulse 86, height 5\' 3"  (1.6 m), weight 176 lb (79.8 kg), SpO2 98 %.  General appearance: alert, cooperative, no distress and moderately obese Lungs: clear to auscultation bilaterally Heart: regular rate and rhythm Extremities: no edema Neurologic: Grossly normal   ASSESSMENT AND PLAN:   Acute diastolic CHF (congestive heart failure) (HCC) Admitted 07/13/18- 07/23/18- diuresed 20 lbs, DC weight 178 lbs  Abnormal echocardiogram EF 50-55% with anterior apical HK suspicious for LAD disease  Demand ischemia (HCC) Slightly elevated troponin felt to be demand ischemia from acute diastolic CHF- will need to r/o CAD- check Myoview   PLAN  Check Myoview, f/u Dr Purvis SheffieldKoneswaran. Labs recently checked by PCP.  Corine ShelterLuke Maxim Bedel PA-C 09/13/2018 10:05 AM

## 2018-09-13 NOTE — Progress Notes (Signed)
Instructions for stress test given

## 2018-09-13 NOTE — Patient Instructions (Addendum)
Medication Instructions:  Your physician recommends that you continue on your current medications as directed. Please refer to the Current Medication list given to you today.  If you need a refill on your cardiac medications before your next appointment, please call your pharmacy.   Lab work: NONE  If you have labs (blood work) drawn today and your tests are completely normal, you will receive your results only by: Marland Kitchen. MyChart Message (if you have MyChart) OR . A paper copy in the mail If you have any lab test that is abnormal or we need to change your treatment, we will call you to review the results.  Testing/Procedures: Your physician has requested that you have a lexiscan myoview. For further information please visit [email or web address]. Please follow instruction sheet, as given.    Follow-Up: At [facility], you and your health needs are our priority.  As part of our continuing mission to provide you with exceptional heart care, we have created designated Provider Care Teams.  These Care Teams include your primary Cardiologist (physician) and Advanced Practice Providers (APPs -  Physician Assistants and Nurse Practitioners) who all work together to provide you with the care you need, when you need it. You will need a follow up appointment in 3 months.  Please call our office 2 months in advance to schedule this appointment.  You may see Prentice DockerSuresh Koneswaran, MD or one of the following Advanced Practice Providers on your designated Care Team:   Randall AnBrittany Strader, PA-C [facility]([place] Office) . Jacolyn ReedyMichele Lenze, PA-C [facility]([place] Office)  Any Other Special Instructions Will Be Listed Below (If Applicable). Thank you for choosing [facility]!

## 2018-09-13 NOTE — Assessment & Plan Note (Signed)
EF 50-55% with anterior apical HK suspicious for LAD disease

## 2018-09-13 NOTE — Assessment & Plan Note (Signed)
Admitted 07/13/18- 07/23/18- diuresed 20 lbs, DC weight 178 lbs

## 2018-09-23 ENCOUNTER — Encounter (HOSPITAL_COMMUNITY)
Admission: RE | Admit: 2018-09-23 | Discharge: 2018-09-23 | Disposition: A | Discharge status: Home / Self Care | Payer: Medicare HMO | Source: Ambulatory Visit | Attending: Cardiology | Admitting: Cardiology

## 2018-09-23 ENCOUNTER — Encounter (HOSPITAL_COMMUNITY): Payer: Self-pay

## 2018-09-23 ENCOUNTER — Ambulatory Visit (HOSPITAL_COMMUNITY)
Admission: RE | Admit: 2018-09-23 | Discharge: 2018-09-23 | Disposition: A | Discharge status: Home / Self Care | Payer: Medicare HMO | Source: Ambulatory Visit | Attending: Cardiology | Admitting: Cardiology

## 2018-09-23 DIAGNOSIS — I2510 Atherosclerotic heart disease of native coronary artery without angina pectoris: Secondary | ICD-10-CM | POA: Diagnosis not present

## 2018-09-23 LAB — NM MYOCAR MULTI W/SPECT W/WALL MOTION / EF
LV dias vol: 66 mL (ref 46–106)
LV sys vol: 43 mL
Peak HR: 103 {beats}/min
RATE: 0.76
Rest HR: 94 {beats}/min
SDS: 5
SRS: 7
SSS: 12
TID: 1.05

## 2018-09-23 MED ORDER — TECHNETIUM TC 99M TETROFOSMIN IV KIT
30.0000 | PACK | Freq: Once | INTRAVENOUS | Status: AC | PRN
  Administered 2018-09-23: 30 via INTRAVENOUS

## 2018-09-23 MED ORDER — TECHNETIUM TC 99M TETROFOSMIN IV KIT
10.0000 | PACK | Freq: Once | INTRAVENOUS | Status: AC | PRN
  Administered 2018-09-23: 10.2 via INTRAVENOUS

## 2018-09-23 MED ORDER — SODIUM CHLORIDE 0.9% FLUSH
INTRAVENOUS | Status: AC
  Administered 2018-09-23: 10 mL via INTRAVENOUS
  Filled 2018-09-23: qty 10

## 2018-09-23 MED ORDER — REGADENOSON 0.4 MG/5ML IV SOLN
INTRAVENOUS | Status: AC
  Administered 2018-09-23: 0.4 mg via INTRAVENOUS
  Filled 2018-09-23: qty 5

## 2018-09-28 ENCOUNTER — Other Ambulatory Visit: Payer: Self-pay

## 2018-09-28 DIAGNOSIS — R931 Abnormal findings on diagnostic imaging of heart and coronary circulation: Secondary | ICD-10-CM

## 2018-09-28 DIAGNOSIS — I5031 Acute diastolic (congestive) heart failure: Secondary | ICD-10-CM

## 2018-09-28 NOTE — Telephone Encounter (Signed)
-----   Message from Abelino Derrick, [place] sent at 09/24/2018  9:31 AM EST ----- T can you let the patient know that the scan did not show any blocked arteries. Dr Purvis Sheffield wants to order a limited echo with contrast to further evaluate her heart pumping function (please order limited echo with contrast for LV function).   Corine Shelter PA-C 09/24/2018 9:28 AM

## 2018-09-28 NOTE — Telephone Encounter (Signed)
Left message for patient to call and order has been placed. This encounter was created in error - please disregard.

## 2018-09-29 ENCOUNTER — Encounter: Payer: Medicare HMO | Attending: Internal Medicine | Admitting: Nutrition

## 2018-09-29 ENCOUNTER — Encounter: Payer: Self-pay | Admitting: Nutrition

## 2018-09-29 VITALS — Ht 62.0 in | Wt 174.0 lb

## 2018-09-29 DIAGNOSIS — I5022 Chronic systolic (congestive) heart failure: Secondary | ICD-10-CM | POA: Insufficient documentation

## 2018-09-29 DIAGNOSIS — E669 Obesity, unspecified: Secondary | ICD-10-CM

## 2018-09-29 DIAGNOSIS — I5021 Acute systolic (congestive) heart failure: Secondary | ICD-10-CM | POA: Insufficient documentation

## 2018-09-29 NOTE — Patient Instructions (Addendum)
Goals Follow Low Sodium Diet Drink 3 bottles of water per day Avoid foods with more than 200 mg per serving. Use more herbs and spices Look up utube videos on heart healthy low sodium recipes to try. Exercise 30 minutes a day

## 2018-09-29 NOTE — Progress Notes (Signed)
  Medical Nutrition Therapy:  Appt start time: 0800 end time:  0900.   Assessment:  Primary concerns today: CHF. Lives with her husband and her daughter. She and her daughter do the cooking.  Eats 2-3 meals per day.   Had CHF episode in October and was treated at [facility]. Working lower salt diet.  Here with her daughter, Shanda Bumps. Physical activity; Walk 15 minutes a day. Wt was 194 lbs when she had CHF. Has lost 16 lbs with fluid removal.  Stress test came back negative.  Feels very good and working on a low sodium diet. Quit smoking in 2 months. Engaged to make healthy lifestyle choices and start exercising.  Fluid restriction is 2 L per day .  Preferred Learning Style:    No preference indicated   Learning Readiness:    Ready  Change in progress   MEDICATIONS:    DIETARY INTAKE:  24-hr recall:  B ( AM): Corn flakes with 2% milk, OJ  Snk ( AM): MT Dew once a day L ( PM): salmon cakes, mixed vegetables, Sprite Snk ( PM):  D ( PM): Iceberg salad with tomatoes, onions, cheese,  Ranch Dressing,  Orange Drink  Snk ( PM):  Beverages: Water, sodas,  Usual physical activity:   Estimated energy needs: 1500  calories 170 g carbohydrates 112 g protein 42 g fat  Progress Towards Goal(s):  In progress.   Nutritional Diagnosis:  NI-5.4 Decreased nutrient needs (specify): Sodium As related to CHF.  As evidenced by 18 lbs fluid build up;Marland Kitchen    Intervention:  Low Sodium Diet, reading food labels, portion sizes, heart healthy cooking..  Goals Follow Low Sodium Diet Drink 3 bottles of water per day Avoid foods with more than 200 mg per serving. Use more herbs and spices Look up utube videos on heart healthy low sodium recipes to try. Exercise 30 minutes a day   Teaching Method Utilized:  Visual Auditory Hands on  Handouts given during visit include:  Low Sodium Handout  [facility] Nutrition Therapy  LS Seasonings    Barriers to learning/adherence to lifestyle change:  None  Demonstrated degree of understanding via:  Teach Back   Monitoring/Evaluation:  Dietary intake, exercise,  and body weight prn.

## 2018-09-30 ENCOUNTER — Ambulatory Visit (HOSPITAL_COMMUNITY)
Admission: RE | Admit: 2018-09-30 | Discharge: 2018-09-30 | Disposition: A | Discharge status: Home / Self Care | Payer: Medicare HMO | Source: Ambulatory Visit | Attending: Cardiovascular Disease | Admitting: Cardiovascular Disease

## 2018-09-30 ENCOUNTER — Telehealth: Payer: Self-pay

## 2018-09-30 DIAGNOSIS — I5031 Acute diastolic (congestive) heart failure: Secondary | ICD-10-CM

## 2018-09-30 DIAGNOSIS — R931 Abnormal findings on diagnostic imaging of heart and coronary circulation: Secondary | ICD-10-CM | POA: Diagnosis not present

## 2018-09-30 MED ORDER — PERFLUTREN LIPID MICROSPHERE
1.0000 mL | INTRAVENOUS | Status: AC | PRN
  Administered 2018-09-30: 2 mL via INTRAVENOUS
  Administered 2018-09-30: 1 mL via INTRAVENOUS

## 2018-09-30 NOTE — Progress Notes (Signed)
*  PRELIMINARY RESULTS* Echocardiogram Limited 2-D Echocardiogram has been performed with Definity.  Stacey DrainWhite, Chrissi Crow J 09/30/2018, 10:18 AM

## 2018-09-30 NOTE — Progress Notes (Signed)
I.V. discontinued. Site was clean, dry and intact. 2X2, tape and coflex applied.   Celesta Gentile, RCS

## 2018-09-30 NOTE — Telephone Encounter (Signed)
IV # 20 right AC established after 1 attempt.Flushed easily with 10 ml NSS, secured at site. Patient to have limited echo Difinity contrast    Celesta Gentile, Echo Tech will d/c after test

## 2018-10-14 DIAGNOSIS — I248 Other forms of acute ischemic heart disease: Secondary | ICD-10-CM | POA: Diagnosis not present

## 2018-10-14 DIAGNOSIS — M5030 Other cervical disc degeneration, unspecified cervical region: Secondary | ICD-10-CM | POA: Diagnosis not present

## 2018-10-14 DIAGNOSIS — I5031 Acute diastolic (congestive) heart failure: Secondary | ICD-10-CM | POA: Diagnosis not present

## 2018-10-14 DIAGNOSIS — I5020 Unspecified systolic (congestive) heart failure: Secondary | ICD-10-CM | POA: Diagnosis not present

## 2018-12-02 DIAGNOSIS — I248 Other forms of acute ischemic heart disease: Secondary | ICD-10-CM | POA: Diagnosis not present

## 2018-12-02 DIAGNOSIS — G894 Chronic pain syndrome: Secondary | ICD-10-CM | POA: Diagnosis not present

## 2018-12-02 DIAGNOSIS — Z23 Encounter for immunization: Secondary | ICD-10-CM | POA: Diagnosis not present

## 2018-12-02 DIAGNOSIS — J9601 Acute respiratory failure with hypoxia: Secondary | ICD-10-CM | POA: Diagnosis not present

## 2018-12-02 DIAGNOSIS — F419 Anxiety disorder, unspecified: Secondary | ICD-10-CM | POA: Diagnosis not present

## 2018-12-02 DIAGNOSIS — Z6830 Body mass index (BMI) 30.0-30.9, adult: Secondary | ICD-10-CM | POA: Diagnosis not present

## 2018-12-03 ENCOUNTER — Other Ambulatory Visit (HOSPITAL_COMMUNITY)
Admission: RE | Admit: 2018-12-03 | Discharge: 2018-12-03 | Disposition: A | Discharge status: Home / Self Care | Payer: 59 | Source: Ambulatory Visit | Attending: Internal Medicine | Admitting: Internal Medicine

## 2018-12-03 ENCOUNTER — Other Ambulatory Visit: Payer: Self-pay

## 2018-12-03 ENCOUNTER — Ambulatory Visit: Payer: 59 | Admitting: Cardiovascular Disease

## 2018-12-03 ENCOUNTER — Encounter: Payer: Self-pay | Admitting: Cardiovascular Disease

## 2018-12-03 VITALS — BP 126/72 | HR 88 | Ht 65.0 in | Wt 179.0 lb

## 2018-12-03 DIAGNOSIS — I5032 Chronic diastolic (congestive) heart failure: Secondary | ICD-10-CM

## 2018-12-03 DIAGNOSIS — Z79899 Other long term (current) drug therapy: Secondary | ICD-10-CM | POA: Insufficient documentation

## 2018-12-03 DIAGNOSIS — R9439 Abnormal result of other cardiovascular function study: Secondary | ICD-10-CM

## 2018-12-03 DIAGNOSIS — F17201 Nicotine dependence, unspecified, in remission: Secondary | ICD-10-CM | POA: Diagnosis not present

## 2018-12-03 DIAGNOSIS — E7800 Pure hypercholesterolemia, unspecified: Secondary | ICD-10-CM

## 2018-12-03 LAB — BASIC METABOLIC PANEL
Anion gap: 11 (ref 5–15)
BUN: 16 mg/dL (ref 8–23)
CHLORIDE: 94 mmol/L — AB (ref 98–111)
CO2: 35 mmol/L — AB (ref 22–32)
Calcium: 9.1 mg/dL (ref 8.9–10.3)
Creatinine, Ser: 0.9 mg/dL (ref 0.44–1.00)
GFR calc Af Amer: >60 mL/min (ref >60)
GFR calc non Af Amer: >60 mL/min (ref >60)
Glucose, Bld: 105 mg/dL — ABNORMAL HIGH (ref 70–99)
Potassium: 3.7 mmol/L (ref 3.5–5.1)
Sodium: 140 mmol/L (ref 135–145)

## 2018-12-03 NOTE — Patient Instructions (Signed)
Medication Instructions:  Your physician recommends that you continue on your current medications as directed. Please refer to the Current Medication list given to you today.  If you need a refill on your cardiac medications before your next appointment, please call your pharmacy.   Lab work: BMET If you have labs (blood work) drawn today and your tests are completely normal, you will receive your results only by: Marland Kitchen MyChart Message (if you have MyChart) OR . A paper copy in the mail If you have any lab test that is abnormal or we need to change your treatment, we will call you to review the results.  Testing/Procedures: None today  Follow-Up: At [facility], you and your health needs are our priority.  As part of our continuing mission to provide you with exceptional heart care, we have created designated Provider Care Teams.  These Care Teams include your primary Cardiologist (physician) and Advanced Practice Providers (APPs -  Physician Assistants and Nurse Practitioners) who all work together to provide you with the care you need, when you need it. You will need a follow up appointment in 6 months.  Please call our office 2 months in advance to schedule this appointment.  You may see Prentice Docker, MD or one of the following Advanced Practice Providers on your designated Care Team:   Randall An, PA-C [facility]) . Jacolyn Reedy, PA-C [facility])  Any Other Special Instructions Will Be Listed Below (If Applicable). None

## 2018-12-03 NOTE — Progress Notes (Signed)
SUBJECTIVE: The patient presents for routine follow-up.  She was hospitalized for diastolic heart failure in October 2019.  She had a troponin elevation at that time deemed secondary to demand ischemia.  Echocardiogram in October 2019 showed LVEF 50 to 55% with grade 1 diastolic dysfunction and mild apical hypokinesis.  Nuclear stress test on 09/23/2018 was abnormal.  There was a moderate sized defect consistent with scar and possible soft tissue attenuation.  There was no evidence of ischemia.  There was extensive soft tissue overlying the heart.  LVEF was calculated at 35%.  She then underwent a limited echocardiogram with contrast on 09/30/2018 which showed normal left ventricular systolic function, LVEF 60 to 97%.  There were no regional wall motion abnormalities.  The patient denies any symptoms of chest pain, palpitations, shortness of breath, lightheadedness, dizziness, leg swelling, orthopnea, PND, and syncope.  She quit smoking at the time of hospitalization.  She is very grateful for her care.  She has a strong faith.     Review of Systems: As per "subjective", otherwise negative.  No Known Allergies  Current Outpatient Medications  Medication Sig Dispense Refill  . alendronate (FOSAMAX) 70 MG tablet Take 70 mg by mouth every Tuesday. Take with a full glass of water on an empty stomach. Takes on Saturdays     . ALPRAZolam (XANAX) 1 MG tablet Take 0.5 tablets (0.5 mg total) by mouth 3 (three) times daily as needed for anxiety. anxiety 5 tablet 0  . aspirin EC 81 MG EC tablet Take 1 tablet (81 mg total) by mouth daily. 30 tablet 0  . Calcium Carb-Cholecalciferol (CALCIUM + D3) 600-200 MG-UNIT TABS Take 1 tablet by mouth daily.     Marland Kitchen ezetimibe (ZETIA) 10 MG tablet Take 10 mg by mouth at bedtime.    . furosemide (LASIX) 40 MG tablet Take 1.5 tablets (60 mg total) by mouth 2 (two) times daily. 90 tablet 0  . furosemide (LASIX) 40 MG tablet Take 40 mg by mouth daily. Takes 1  1/2 pill BID.    Marland Kitchen FUROSEMIDE PO Take by mouth.    . meloxicam (MOBIC) 15 MG tablet Take 15 mg by mouth daily as needed for pain.     . nicotine (NICODERM CQ - DOSED IN MG/24 HR) 7 mg/24hr patch Place 1 patch (7 mg total) onto the skin daily. 28 patch 0  . oxyCODONE-acetaminophen (PERCOCET) 5-325 MG per tablet Take 1 tablet by mouth every 6 (six) hours as needed. Back pain    . polyethylene glycol (MIRALAX / GLYCOLAX) packet Take 17 g by mouth daily as needed for mild constipation. 14 each 0  . potassium chloride (KLOR-CON) 20 MEQ packet Take 30 mEq by mouth daily. 20 tablet 0  . simvastatin (ZOCOR) 40 MG tablet Take 40 mg by mouth at bedtime.     No current facility-administered medications for this visit.     Past Medical History:  Diagnosis Date  . Anxiety   . Back pain   . Chronic neck pain   . DDD (degenerative disc disease), cervical   . Depression   . DJD (degenerative joint disease)   . Gait abnormality   . Osteoporosis     Past Surgical History:  Procedure Laterality Date  . ABDOMINAL HYSTERECTOMY    . CESAREAN SECTION    . TUBAL LIGATION      Social History   Socioeconomic History  . Marital status: Married    Spouse name: Not on file  .  Number of children: Not on file  . Years of education: ged   . Highest education level: Not on file  Occupational History  . Occupation: Set designer work   Engineer, production  . Financial resource strain: Not on file  . Food insecurity:    Worry: Not on file    Inability: Not on file  . Transportation needs:    Medical: Not on file    Non-medical: Not on file  Tobacco Use  . Smoking status: Former Smoker    Packs/day: 1.00    Types: Cigarettes  . Smokeless tobacco: Never Used  Substance and Sexual Activity  . Alcohol use: No  . Drug use: No  . Sexual activity: Not on file  Lifestyle  . Physical activity:    Days per week: Not on file    Minutes per session: Not on file  . Stress: Not on file  Relationships  .  Social connections:    Talks on phone: Not on file    Gets together: Not on file    Attends religious service: Not on file    Active member of club or organization: Not on file    Attends meetings of clubs or organizations: Not on file    Relationship status: Not on file  . Intimate partner violence:    Fear of current or ex partner: Not on file    Emotionally abused: Not on file    Physically abused: Not on file    Forced sexual activity: Not on file  Other Topics Concern  . Not on file  Social History Narrative  . Not on file     Vitals:   12/03/18 1348  BP: 126/72  Pulse: 88  SpO2: 92%  Weight: 179 lb (81.2 kg)  Height: 5\' 5"  (1.651 m)    Wt Readings from Last 3 Encounters:  12/03/18 179 lb (81.2 kg)  09/29/18 174 lb (78.9 kg)  09/13/18 176 lb (79.8 kg)     PHYSICAL EXAM General: NAD HEENT: Normal. Neck: No JVD, no thyromegaly. Lungs: Clear to auscultation bilaterally with normal respiratory effort. CV: Regular rate and rhythm, normal S1/S2, no S3/S4, no murmur. No pretibial or periankle edema.  No carotid bruit.   Abdomen: Soft, nontender, no distention.  Neurologic: Alert and oriented.  Psych: Normal affect. Skin: Normal. Musculoskeletal: No gross deformities.    ECG: Reviewed above under Subjective   Labs: Lab Results  Component Value Date/Time   K 3.8 07/23/2018 05:16 AM   BUN 21 07/23/2018 05:16 AM   CREATININE 0.65 07/23/2018 05:16 AM   ALT 28 07/15/2018 04:59 AM   HGB 17.8 (H) 07/15/2018 04:59 AM     Lipids: No results found for: LDLCALC, LDLDIRECT, CHOL, TRIG, HDL     ASSESSMENT AND PLAN: 1.  Chronic diastolic heart failure: Euvolemic.  Currently on oral Lasix 60 mg twice daily.  Also on supplemental potassium.  I will check a basic metabolic panel.  Left ventricular systolic function and regional wall motion were normal by most recent echocardiogram in January 2020.  Nuclear stress test showed no evidence of ischemia.  2.   Hyperlipidemia: Continue simvastatin and Zetia.  3.  Tobacco abuse in remission: She quit smoking after her hospitalization.  4.  Abnormal nuclear stress test: There was some suggestion of myocardial scar but there was also extensive soft tissue attenuation.  Echocardiogram demonstrated normal left ventricular systolic function.  She is currently on aspirin and statin therapy.  She denies anginal symptoms altogether.  Disposition: Follow up 6 months   Kate Sable, M.D., F.A.C.C.

## 2018-12-06 ENCOUNTER — Telehealth: Payer: Self-pay | Admitting: Cardiovascular Disease

## 2018-12-06 NOTE — Telephone Encounter (Signed)
Returning call for lab results °

## 2018-12-06 NOTE — Telephone Encounter (Signed)
rseults given, normal renal function

## 2018-12-30 DIAGNOSIS — E6609 Other obesity due to excess calories: Secondary | ICD-10-CM | POA: Diagnosis not present

## 2018-12-30 DIAGNOSIS — F419 Anxiety disorder, unspecified: Secondary | ICD-10-CM | POA: Diagnosis not present

## 2018-12-30 DIAGNOSIS — Z01419 Encounter for gynecological examination (general) (routine) without abnormal findings: Secondary | ICD-10-CM | POA: Diagnosis not present

## 2018-12-30 DIAGNOSIS — G894 Chronic pain syndrome: Secondary | ICD-10-CM | POA: Diagnosis not present

## 2018-12-30 DIAGNOSIS — M5030 Other cervical disc degeneration, unspecified cervical region: Secondary | ICD-10-CM | POA: Diagnosis not present

## 2018-12-30 DIAGNOSIS — Z6830 Body mass index (BMI) 30.0-30.9, adult: Secondary | ICD-10-CM | POA: Diagnosis not present

## 2019-04-06 DIAGNOSIS — G894 Chronic pain syndrome: Secondary | ICD-10-CM | POA: Diagnosis not present

## 2019-04-06 DIAGNOSIS — L0390 Cellulitis, unspecified: Secondary | ICD-10-CM | POA: Diagnosis not present

## 2019-04-06 DIAGNOSIS — Z1389 Encounter for screening for other disorder: Secondary | ICD-10-CM | POA: Diagnosis not present

## 2019-04-06 DIAGNOSIS — Z6829 Body mass index (BMI) 29.0-29.9, adult: Secondary | ICD-10-CM | POA: Diagnosis not present

## 2019-04-06 DIAGNOSIS — F419 Anxiety disorder, unspecified: Secondary | ICD-10-CM | POA: Diagnosis not present

## 2019-05-06 DIAGNOSIS — G894 Chronic pain syndrome: Secondary | ICD-10-CM | POA: Diagnosis not present

## 2019-06-03 DIAGNOSIS — G894 Chronic pain syndrome: Secondary | ICD-10-CM | POA: Diagnosis not present

## 2019-06-03 DIAGNOSIS — G4700 Insomnia, unspecified: Secondary | ICD-10-CM | POA: Diagnosis not present

## 2019-06-22 DIAGNOSIS — E7849 Other hyperlipidemia: Secondary | ICD-10-CM | POA: Diagnosis not present

## 2019-06-22 DIAGNOSIS — M1991 Primary osteoarthritis, unspecified site: Secondary | ICD-10-CM | POA: Diagnosis not present

## 2019-07-01 DIAGNOSIS — Z23 Encounter for immunization: Secondary | ICD-10-CM | POA: Diagnosis not present

## 2019-07-13 DIAGNOSIS — M5030 Other cervical disc degeneration, unspecified cervical region: Secondary | ICD-10-CM | POA: Diagnosis not present

## 2019-07-13 DIAGNOSIS — E782 Mixed hyperlipidemia: Secondary | ICD-10-CM | POA: Diagnosis not present

## 2019-07-13 DIAGNOSIS — G894 Chronic pain syndrome: Secondary | ICD-10-CM | POA: Diagnosis not present

## 2019-07-23 DIAGNOSIS — M810 Age-related osteoporosis without current pathological fracture: Secondary | ICD-10-CM | POA: Diagnosis not present

## 2019-07-23 DIAGNOSIS — E782 Mixed hyperlipidemia: Secondary | ICD-10-CM | POA: Diagnosis not present

## 2019-08-12 DIAGNOSIS — F419 Anxiety disorder, unspecified: Secondary | ICD-10-CM | POA: Diagnosis not present

## 2019-08-12 DIAGNOSIS — G894 Chronic pain syndrome: Secondary | ICD-10-CM | POA: Diagnosis not present

## 2019-09-12 DIAGNOSIS — M5030 Other cervical disc degeneration, unspecified cervical region: Secondary | ICD-10-CM | POA: Diagnosis not present

## 2019-09-12 DIAGNOSIS — G894 Chronic pain syndrome: Secondary | ICD-10-CM | POA: Diagnosis not present

## 2019-09-12 DIAGNOSIS — M159 Polyosteoarthritis, unspecified: Secondary | ICD-10-CM | POA: Diagnosis not present

## 2019-09-22 DIAGNOSIS — F4542 Pain disorder with related psychological factors: Secondary | ICD-10-CM | POA: Diagnosis not present

## 2019-09-22 DIAGNOSIS — G894 Chronic pain syndrome: Secondary | ICD-10-CM | POA: Diagnosis not present

## 2019-09-22 DIAGNOSIS — M810 Age-related osteoporosis without current pathological fracture: Secondary | ICD-10-CM | POA: Diagnosis not present

## 2019-10-20 DIAGNOSIS — F419 Anxiety disorder, unspecified: Secondary | ICD-10-CM | POA: Diagnosis not present

## 2019-10-20 DIAGNOSIS — M5030 Other cervical disc degeneration, unspecified cervical region: Secondary | ICD-10-CM | POA: Diagnosis not present

## 2019-10-20 DIAGNOSIS — G894 Chronic pain syndrome: Secondary | ICD-10-CM | POA: Diagnosis not present

## 2019-10-20 DIAGNOSIS — F330 Major depressive disorder, recurrent, mild: Secondary | ICD-10-CM | POA: Diagnosis not present

## 2019-10-23 DIAGNOSIS — G894 Chronic pain syndrome: Secondary | ICD-10-CM | POA: Diagnosis not present

## 2019-10-23 DIAGNOSIS — F4542 Pain disorder with related psychological factors: Secondary | ICD-10-CM | POA: Diagnosis not present

## 2019-10-23 DIAGNOSIS — M810 Age-related osteoporosis without current pathological fracture: Secondary | ICD-10-CM | POA: Diagnosis not present

## 2019-11-20 DIAGNOSIS — G894 Chronic pain syndrome: Secondary | ICD-10-CM | POA: Diagnosis not present

## 2019-11-20 DIAGNOSIS — M810 Age-related osteoporosis without current pathological fracture: Secondary | ICD-10-CM | POA: Diagnosis not present

## 2019-11-20 DIAGNOSIS — F4542 Pain disorder with related psychological factors: Secondary | ICD-10-CM | POA: Diagnosis not present

## 2019-12-08 DIAGNOSIS — G894 Chronic pain syndrome: Secondary | ICD-10-CM | POA: Diagnosis not present

## 2020-01-02 ENCOUNTER — Other Ambulatory Visit: Payer: Self-pay | Admitting: Internal Medicine

## 2020-01-02 DIAGNOSIS — Z1231 Encounter for screening mammogram for malignant neoplasm of breast: Secondary | ICD-10-CM

## 2020-01-11 DIAGNOSIS — F419 Anxiety disorder, unspecified: Secondary | ICD-10-CM | POA: Diagnosis not present

## 2020-01-11 DIAGNOSIS — G894 Chronic pain syndrome: Secondary | ICD-10-CM | POA: Diagnosis not present

## 2020-01-13 ENCOUNTER — Other Ambulatory Visit: Payer: Self-pay

## 2020-01-13 ENCOUNTER — Ambulatory Visit
Admission: RE | Admit: 2020-01-13 | Discharge: 2020-01-13 | Disposition: A | Discharge status: Home / Self Care | Payer: Medicare HMO | Source: Ambulatory Visit | Attending: Internal Medicine | Admitting: Internal Medicine

## 2020-01-13 DIAGNOSIS — Z1231 Encounter for screening mammogram for malignant neoplasm of breast: Secondary | ICD-10-CM | POA: Diagnosis not present

## 2020-01-20 DIAGNOSIS — M810 Age-related osteoporosis without current pathological fracture: Secondary | ICD-10-CM | POA: Diagnosis not present

## 2020-01-20 DIAGNOSIS — F4542 Pain disorder with related psychological factors: Secondary | ICD-10-CM | POA: Diagnosis not present

## 2020-01-20 DIAGNOSIS — G894 Chronic pain syndrome: Secondary | ICD-10-CM | POA: Diagnosis not present

## 2020-02-23 DIAGNOSIS — G894 Chronic pain syndrome: Secondary | ICD-10-CM | POA: Diagnosis not present

## 2020-03-21 DIAGNOSIS — M810 Age-related osteoporosis without current pathological fracture: Secondary | ICD-10-CM | POA: Diagnosis not present

## 2020-03-21 DIAGNOSIS — G894 Chronic pain syndrome: Secondary | ICD-10-CM | POA: Diagnosis not present

## 2020-03-21 DIAGNOSIS — F4542 Pain disorder with related psychological factors: Secondary | ICD-10-CM | POA: Diagnosis not present

## 2020-03-22 DIAGNOSIS — M159 Polyosteoarthritis, unspecified: Secondary | ICD-10-CM | POA: Diagnosis not present

## 2020-03-22 DIAGNOSIS — M5030 Other cervical disc degeneration, unspecified cervical region: Secondary | ICD-10-CM | POA: Diagnosis not present

## 2020-03-22 DIAGNOSIS — G894 Chronic pain syndrome: Secondary | ICD-10-CM | POA: Diagnosis not present

## 2020-03-22 DIAGNOSIS — B359 Dermatophytosis, unspecified: Secondary | ICD-10-CM | POA: Diagnosis not present

## 2020-04-28 ENCOUNTER — Other Ambulatory Visit: Payer: Self-pay

## 2020-04-28 ENCOUNTER — Emergency Department (HOSPITAL_COMMUNITY): Payer: Medicare HMO

## 2020-04-28 ENCOUNTER — Encounter (HOSPITAL_COMMUNITY): Payer: Self-pay | Admitting: Emergency Medicine

## 2020-04-28 ENCOUNTER — Inpatient Hospital Stay (HOSPITAL_COMMUNITY)
Admission: EM | Admit: 2020-04-28 | Discharge: 2020-05-05 | DRG: 356 | Disposition: A | Discharge status: Home / Self Care | Payer: Medicare HMO | Attending: Internal Medicine | Admitting: Internal Medicine

## 2020-04-28 DIAGNOSIS — E538 Deficiency of other specified B group vitamins: Secondary | ICD-10-CM | POA: Diagnosis present

## 2020-04-28 DIAGNOSIS — I5031 Acute diastolic (congestive) heart failure: Secondary | ICD-10-CM | POA: Diagnosis present

## 2020-04-28 DIAGNOSIS — Z87891 Personal history of nicotine dependence: Secondary | ICD-10-CM | POA: Diagnosis not present

## 2020-04-28 DIAGNOSIS — Z791 Long term (current) use of non-steroidal anti-inflammatories (NSAID): Secondary | ICD-10-CM | POA: Diagnosis not present

## 2020-04-28 DIAGNOSIS — E785 Hyperlipidemia, unspecified: Secondary | ICD-10-CM | POA: Diagnosis present

## 2020-04-28 DIAGNOSIS — K2970 Gastritis, unspecified, without bleeding: Secondary | ICD-10-CM | POA: Diagnosis present

## 2020-04-28 DIAGNOSIS — K316 Fistula of stomach and duodenum: Secondary | ICD-10-CM | POA: Diagnosis not present

## 2020-04-28 DIAGNOSIS — Z7982 Long term (current) use of aspirin: Secondary | ICD-10-CM | POA: Diagnosis not present

## 2020-04-28 DIAGNOSIS — R05 Cough: Secondary | ICD-10-CM | POA: Diagnosis not present

## 2020-04-28 DIAGNOSIS — M810 Age-related osteoporosis without current pathological fracture: Secondary | ICD-10-CM | POA: Diagnosis present

## 2020-04-28 DIAGNOSIS — I5033 Acute on chronic diastolic (congestive) heart failure: Secondary | ICD-10-CM | POA: Diagnosis present

## 2020-04-28 DIAGNOSIS — K274 Chronic or unspecified peptic ulcer, site unspecified, with hemorrhage: Secondary | ICD-10-CM | POA: Diagnosis not present

## 2020-04-28 DIAGNOSIS — F411 Generalized anxiety disorder: Secondary | ICD-10-CM | POA: Diagnosis present

## 2020-04-28 DIAGNOSIS — Z79899 Other long term (current) drug therapy: Secondary | ICD-10-CM

## 2020-04-28 DIAGNOSIS — K3189 Other diseases of stomach and duodenum: Secondary | ICD-10-CM | POA: Diagnosis not present

## 2020-04-28 DIAGNOSIS — K264 Chronic or unspecified duodenal ulcer with hemorrhage: Principal | ICD-10-CM | POA: Diagnosis present

## 2020-04-28 DIAGNOSIS — R000 Tachycardia, unspecified: Secondary | ICD-10-CM | POA: Diagnosis not present

## 2020-04-28 DIAGNOSIS — K5909 Other constipation: Secondary | ICD-10-CM | POA: Diagnosis present

## 2020-04-28 DIAGNOSIS — J9601 Acute respiratory failure with hypoxia: Secondary | ICD-10-CM | POA: Diagnosis not present

## 2020-04-28 DIAGNOSIS — D62 Acute posthemorrhagic anemia: Secondary | ICD-10-CM | POA: Diagnosis present

## 2020-04-28 DIAGNOSIS — K921 Melena: Secondary | ICD-10-CM | POA: Diagnosis not present

## 2020-04-28 DIAGNOSIS — Z8249 Family history of ischemic heart disease and other diseases of the circulatory system: Secondary | ICD-10-CM | POA: Diagnosis not present

## 2020-04-28 DIAGNOSIS — K922 Gastrointestinal hemorrhage, unspecified: Secondary | ICD-10-CM

## 2020-04-28 DIAGNOSIS — G8929 Other chronic pain: Secondary | ICD-10-CM | POA: Diagnosis present

## 2020-04-28 DIAGNOSIS — K449 Diaphragmatic hernia without obstruction or gangrene: Secondary | ICD-10-CM | POA: Diagnosis present

## 2020-04-28 DIAGNOSIS — J9811 Atelectasis: Secondary | ICD-10-CM | POA: Diagnosis not present

## 2020-04-28 DIAGNOSIS — Z20822 Contact with and (suspected) exposure to covid-19: Secondary | ICD-10-CM | POA: Diagnosis present

## 2020-04-28 DIAGNOSIS — K259 Gastric ulcer, unspecified as acute or chronic, without hemorrhage or perforation: Secondary | ICD-10-CM | POA: Diagnosis present

## 2020-04-28 DIAGNOSIS — R110 Nausea: Secondary | ICD-10-CM | POA: Diagnosis not present

## 2020-04-28 DIAGNOSIS — I959 Hypotension, unspecified: Secondary | ICD-10-CM | POA: Diagnosis not present

## 2020-04-28 DIAGNOSIS — D751 Secondary polycythemia: Secondary | ICD-10-CM | POA: Diagnosis not present

## 2020-04-28 DIAGNOSIS — I774 Celiac artery compression syndrome: Secondary | ICD-10-CM | POA: Diagnosis not present

## 2020-04-28 DIAGNOSIS — F419 Anxiety disorder, unspecified: Secondary | ICD-10-CM | POA: Diagnosis present

## 2020-04-28 DIAGNOSIS — R1110 Vomiting, unspecified: Secondary | ICD-10-CM | POA: Diagnosis not present

## 2020-04-28 DIAGNOSIS — R112 Nausea with vomiting, unspecified: Secondary | ICD-10-CM | POA: Diagnosis not present

## 2020-04-28 DIAGNOSIS — I509 Heart failure, unspecified: Secondary | ICD-10-CM | POA: Diagnosis not present

## 2020-04-28 DIAGNOSIS — J189 Pneumonia, unspecified organism: Secondary | ICD-10-CM

## 2020-04-28 DIAGNOSIS — M549 Dorsalgia, unspecified: Secondary | ICD-10-CM | POA: Diagnosis present

## 2020-04-28 DIAGNOSIS — R531 Weakness: Secondary | ICD-10-CM | POA: Diagnosis not present

## 2020-04-28 LAB — COMPREHENSIVE METABOLIC PANEL
ALT: 11 U/L (ref 0–44)
AST: 15 U/L (ref 15–41)
Albumin: 3 g/dL — ABNORMAL LOW (ref 3.5–5.0)
Alkaline Phosphatase: 86 U/L (ref 38–126)
Anion gap: 11 (ref 5–15)
BUN: 32 mg/dL — ABNORMAL HIGH (ref 8–23)
CO2: 26 mmol/L (ref 22–32)
Calcium: 8.6 mg/dL — ABNORMAL LOW (ref 8.9–10.3)
Chloride: 98 mmol/L (ref 98–111)
Creatinine, Ser: 1.08 mg/dL — ABNORMAL HIGH (ref 0.44–1.00)
GFR calc Af Amer: >60 mL/min (ref >60)
GFR calc non Af Amer: 54 mL/min — ABNORMAL LOW (ref >60)
Glucose, Bld: 186 mg/dL — ABNORMAL HIGH (ref 70–99)
Potassium: 5 mmol/L (ref 3.5–5.1)
Sodium: 135 mmol/L (ref 135–145)
Total Bilirubin: 0.5 mg/dL (ref 0.3–1.2)
Total Protein: 7.7 g/dL (ref 6.5–8.1)

## 2020-04-28 LAB — URINALYSIS, ROUTINE W REFLEX MICROSCOPIC
Bilirubin Urine: NEGATIVE
Glucose, UA: NEGATIVE mg/dL
Ketones, ur: NEGATIVE mg/dL
Nitrite: NEGATIVE
Protein, ur: NEGATIVE mg/dL
Specific Gravity, Urine: 1.012 (ref 1.005–1.030)
pH: 6 (ref 5.0–8.0)

## 2020-04-28 LAB — CBC WITH DIFFERENTIAL/PLATELET
Abs Immature Granulocytes: 0.14 10*3/uL — ABNORMAL HIGH (ref 0.00–0.07)
Basophils Absolute: 0.1 10*3/uL (ref 0.0–0.1)
Basophils Relative: 0 %
Eosinophils Absolute: 0 10*3/uL (ref 0.0–0.5)
Eosinophils Relative: 0 %
HCT: 39.9 % (ref 36.0–46.0)
Hemoglobin: 12.9 g/dL (ref 12.0–15.0)
Immature Granulocytes: 1 %
Lymphocytes Relative: 5 %
Lymphs Abs: 1.1 10*3/uL (ref 0.7–4.0)
MCH: 32.3 pg (ref 26.0–34.0)
MCHC: 32.3 g/dL (ref 30.0–36.0)
MCV: 99.8 fL (ref 80.0–100.0)
Monocytes Absolute: 0.7 10*3/uL (ref 0.1–1.0)
Monocytes Relative: 3 %
Neutro Abs: 18.3 10*3/uL — ABNORMAL HIGH (ref 1.7–7.7)
Neutrophils Relative %: 91 %
Platelets: 532 10*3/uL — ABNORMAL HIGH (ref 150–400)
RBC: 4 MIL/uL (ref 3.87–5.11)
RDW: 12.5 % (ref 11.5–15.5)
WBC: 20.3 10*3/uL — ABNORMAL HIGH (ref 4.0–10.5)
nRBC: 0 % (ref 0.0–0.2)

## 2020-04-28 LAB — SARS CORONAVIRUS 2 BY RT PCR (HOSPITAL ORDER, PERFORMED IN ~~LOC~~ HOSPITAL LAB): SARS Coronavirus 2: NEGATIVE

## 2020-04-28 LAB — POC OCCULT BLOOD, ED: Fecal Occult Bld: POSITIVE — AB

## 2020-04-28 MED ORDER — SODIUM CHLORIDE 0.9 % IV SOLN: INTRAVENOUS | Status: DC

## 2020-04-28 MED ORDER — PANTOPRAZOLE SODIUM 40 MG IV SOLR
40.0000 mg | Freq: Two times a day (BID) | INTRAVENOUS | Status: DC
  Administered 2020-05-02 – 2020-05-05 (×7): 40 mg via INTRAVENOUS
  Filled 2020-04-28 (×7): qty 40

## 2020-04-28 MED ORDER — OXYCODONE-ACETAMINOPHEN 5-325 MG PO TABS
1.0000 | ORAL_TABLET | Freq: Four times a day (QID) | ORAL | Status: DC | PRN
  Administered 2020-04-28 – 2020-05-01 (×6): 1 via ORAL
  Filled 2020-04-28 (×7): qty 1

## 2020-04-28 MED ORDER — ACETAMINOPHEN 650 MG RE SUPP: 650.0000 mg | Freq: Four times a day (QID) | RECTAL | Status: DC | PRN

## 2020-04-28 MED ORDER — NICOTINE 7 MG/24HR TD PT24
7.0000 mg | MEDICATED_PATCH | Freq: Every day | TRANSDERMAL | Status: DC
  Administered 2020-04-29 – 2020-05-04 (×4): 7 mg via TRANSDERMAL
  Filled 2020-04-28 (×6): qty 1

## 2020-04-28 MED ORDER — LACTATED RINGERS IV BOLUS
1000.0000 mL | Freq: Once | INTRAVENOUS | Status: AC
  Administered 2020-04-28: 1000 mL via INTRAVENOUS

## 2020-04-28 MED ORDER — PANTOPRAZOLE SODIUM 40 MG IV SOLR
INTRAVENOUS | Status: AC
  Filled 2020-04-28: qty 80

## 2020-04-28 MED ORDER — ONDANSETRON HCL 4 MG PO TABS: 4.0000 mg | ORAL_TABLET | Freq: Four times a day (QID) | ORAL | Status: DC | PRN

## 2020-04-28 MED ORDER — CALCIUM CARBONATE-VITAMIN D 500-200 MG-UNIT PO TABS
1.0000 | ORAL_TABLET | Freq: Every day | ORAL | Status: DC
  Administered 2020-04-29 – 2020-05-05 (×6): 1 via ORAL
  Filled 2020-04-28 (×9): qty 1

## 2020-04-28 MED ORDER — POTASSIUM CHLORIDE 20 MEQ PO PACK
30.0000 meq | PACK | Freq: Every day | ORAL | Status: DC
  Administered 2020-04-29 – 2020-04-30 (×2): 30 meq via ORAL
  Filled 2020-04-28 (×6): qty 2

## 2020-04-28 MED ORDER — SIMVASTATIN 20 MG PO TABS
40.0000 mg | ORAL_TABLET | Freq: Every day | ORAL | Status: DC
  Administered 2020-04-29 – 2020-05-04 (×7): 40 mg via ORAL
  Filled 2020-04-28 (×7): qty 2

## 2020-04-28 MED ORDER — SODIUM CHLORIDE 0.9 % IV SOLN
8.0000 mg/h | INTRAVENOUS | Status: AC
  Administered 2020-04-29 – 2020-05-01 (×6): 8 mg/h via INTRAVENOUS
  Filled 2020-04-28 (×8): qty 80

## 2020-04-28 MED ORDER — SODIUM CHLORIDE 0.9 % IV SOLN
80.0000 mg | Freq: Once | INTRAVENOUS | Status: AC
  Administered 2020-04-28: 22:00:00 80 mg via INTRAVENOUS
  Filled 2020-04-28: qty 80

## 2020-04-28 MED ORDER — ALPRAZOLAM 0.5 MG PO TABS
0.5000 mg | ORAL_TABLET | Freq: Three times a day (TID) | ORAL | Status: DC | PRN
  Administered 2020-04-28 – 2020-05-03 (×7): 0.5 mg via ORAL
  Filled 2020-04-28 (×8): qty 1

## 2020-04-28 MED ORDER — ONDANSETRON HCL 4 MG/2ML IJ SOLN
4.0000 mg | Freq: Four times a day (QID) | INTRAMUSCULAR | Status: DC | PRN
  Administered 2020-05-01: 4 mg via INTRAVENOUS
  Filled 2020-04-28: qty 2

## 2020-04-28 MED ORDER — ACETAMINOPHEN 325 MG PO TABS
650.0000 mg | ORAL_TABLET | Freq: Four times a day (QID) | ORAL | Status: DC | PRN
  Administered 2020-05-04: 650 mg via ORAL
  Filled 2020-04-28: qty 2

## 2020-04-28 MED ORDER — CYCLOBENZAPRINE HCL 10 MG PO TABS
10.0000 mg | ORAL_TABLET | Freq: Two times a day (BID) | ORAL | Status: DC | PRN
  Administered 2020-05-02 – 2020-05-03 (×2): 10 mg via ORAL
  Filled 2020-04-28 (×2): qty 1

## 2020-04-28 NOTE — ED Notes (Signed)
Asked CN to restart pt's IV, pt is a difficult stick and this nurse has stuck x 2

## 2020-04-28 NOTE — ED Provider Notes (Signed)
[facility] EMERGENCY DEPARTMENT Provider Note   CSN: [id number] Arrival date & time: 04/28/20  1848     History Chief Complaint  Patient presents with  . Weakness    Nicole Welch is a 64 y.o. female.  HPI   64 year old female with generalized weakness.  Onset last Thursday when she was vacationing at [facility].  She noticed that she felt unusually fatigued when walking.  Her symptoms have progressed since that time.  Today she noticed some very black/tarry stools.  She takes 81 mg of aspirin chronically but ran out about a week ago and someone gave her another bottle but this was of 325 mg tablets.  Mild ache in the center of her abdomen.  No history of PUD that she is aware of.  Mild shortness of breath.  No dizziness or lightheadedness.  Denies any past history of GI bleed.  Past Medical History:  Diagnosis Date  . Anxiety   . Back pain   . Chronic neck pain   . DDD (degenerative disc disease), cervical   . Depression   . DJD (degenerative joint disease)   . Gait abnormality   . Osteoporosis     Patient Active Problem List   Diagnosis Date Noted  . Abnormal echocardiogram 09/13/2018  . Acute diastolic CHF (congestive heart failure) (HCC) 07/14/2018  . Demand ischemia (HCC) 07/14/2018  . Acute respiratory failure with hypoxia (HCC) 07/14/2018  . Fluid overload 07/13/2018  . Tobacco abuse 07/13/2018  . Anxiety 07/13/2018  . UTI (urinary tract infection) 04/23/2014  . Leukocytosis 04/22/2014  . Acute bronchitis 04/22/2014  . Other and unspecified hyperlipidemia 04/22/2014  . CLOSED FRACTURE OF CALCANEUS 11/06/2010    Past Surgical History:  Procedure Laterality Date  . ABDOMINAL HYSTERECTOMY    . CESAREAN SECTION    . TUBAL LIGATION       OB History   No obstetric history on file.     Family History  Problem Relation Age of Onset  . Heart disease Other        family history   . Arthritis Other        family history   . CAD Father        75's     Social History   Tobacco Use  . Smoking status: Former Smoker    Packs/day: 1.00    Types: Cigarettes  . Smokeless tobacco: Never Used  Vaping Use  . Vaping Use: Never used  Substance Use Topics  . Alcohol use: No  . Drug use: No    Home Medications Prior to Admission medications   Medication Sig Start Date End Date Taking? Authorizing Provider  ALPRAZolam Prudy Feeler) 0.5 MG tablet Take 0.5 mg by mouth 3 (three) times daily as needed for anxiety. 02/10/20  Yes [provider]  aspirin EC 81 MG EC tablet Take 1 tablet (81 mg total) by mouth daily. 07/24/18  Yes Dhungel, Nishant, MD  Calcium Carb-Cholecalciferol (CALCIUM + D3) 600-200 MG-UNIT TABS Take 1 tablet by mouth daily.    Yes [provider]  cyclobenzaprine (FLEXERIL) 10 MG tablet Take 1 tablet by mouth 2 (two) times daily as needed for muscle spasms. 03/25/20  Yes [provider]  ezetimibe (ZETIA) 10 MG tablet Take 10 mg by mouth at bedtime. 06/01/18  Yes [provider]  furosemide (LASIX) 40 MG tablet Take 60 mg by mouth 2 (two) times daily.    Yes [provider]  oxyCODONE-acetaminophen (PERCOCET) 5-325 MG per  tablet Take 1 tablet by mouth every 6 (six) hours as needed. Back pain 10/21/10  Yes [provider]  potassium chloride (KLOR-CON) 20 MEQ packet Take 30 mEq by mouth daily. 07/23/18  Yes Dhungel, Nishant, MD  simvastatin (ZOCOR) 40 MG tablet Take 40 mg by mouth at bedtime. 04/26/18  Yes [provider]  nicotine (NICODERM CQ - DOSED IN MG/24 HR) 7 mg/24hr patch Place 1 patch (7 mg total) onto the skin daily. 07/24/18   Dhungel, Theda Belfast, MD    Allergies    Patient has no known allergies.  Review of Systems   Review of Systems All systems reviewed and negative, other than as noted in HPI.  Physical Exam Updated Vital Signs BP 113/77   Pulse (!) 116   Temp 98.3 F (36.8 C) (Oral)   Resp 17   Ht 5\' 5"  (1.651 m)   Wt 77.1 kg   SpO2 94%   BMI 28.29  kg/m   Physical Exam Vitals and nursing note reviewed.  Constitutional:      General: She is not in acute distress.    Appearance: She is well-developed.  HENT:     Head: Normocephalic and atraumatic.  Eyes:     General:        Right eye: No discharge.        Left eye: No discharge.     Conjunctiva/sclera: Conjunctivae normal.  Cardiovascular:     Rate and Rhythm: Regular rhythm. Tachycardia present.     Heart sounds: Normal heart sounds. No murmur heard.  No friction rub. No gallop.   Pulmonary:     Effort: Pulmonary effort is normal. No respiratory distress.     Breath sounds: Normal breath sounds.  Abdominal:     General: There is no distension.     Palpations: Abdomen is soft.     Tenderness: There is no abdominal tenderness.  Genitourinary:    Comments: Melanotic stool Musculoskeletal:        General: No tenderness.     Cervical back: Neck supple.  Skin:    General: Skin is warm and dry.  Neurological:     Mental Status: She is alert.  Psychiatric:        Behavior: Behavior normal.        Thought Content: Thought content normal.     ED Results / Procedures / Treatments   Labs (all labs ordered are listed, but only abnormal results are displayed) Labs Reviewed  CBC WITH DIFFERENTIAL/PLATELET - Abnormal; Notable for the following components:      Result Value   WBC 20.3 (*)    Platelets 532 (*)    Neutro Abs 18.3 (*)    Abs Immature Granulocytes 0.14 (*)    All other components within normal limits  COMPREHENSIVE METABOLIC PANEL - Abnormal; Notable for the following components:   Glucose, Bld 186 (*)    BUN 32 (*)    Creatinine, Ser 1.08 (*)    Calcium 8.6 (*)    Albumin 3.0 (*)    GFR calc non Af Amer 54 (*)    All other components within normal limits  URINALYSIS, ROUTINE W REFLEX MICROSCOPIC - Abnormal; Notable for the following components:   APPearance HAZY (*)    Hgb urine dipstick LARGE (*)    Leukocytes,Ua MODERATE (*)    Bacteria, UA RARE (*)     All other components within normal limits  POC OCCULT BLOOD, ED - Abnormal; Notable for the following components:  Fecal Occult Bld POSITIVE (*)    All other components within normal limits  SARS CORONAVIRUS 2 BY RT PCR (HOSPITAL ORDER, PERFORMED IN [facility])  URINE CULTURE  OCCULT BLOOD X 1 CARD TO LAB, STOOL  LACTIC ACID, PLASMA  LACTIC ACID, PLASMA    EKG EKG Interpretation  Date/Time:  Saturday April 28 2020 20:43:39 EDT Ventricular Rate:  125 PR Interval:    QRS Duration: 82 QT Interval:  309 QTC Calculation: 446 R Axis:   -83 Text Interpretation: Sinus tachycardia Anterolateral infarct, age indeterminate Confirmed by Raeford Razor [id number]) on 04/28/2020 10:07:39 PM   Radiology DG Chest Portable 1 View  Result Date: 04/28/2020 CLINICAL DATA:  Hypoxemia, generalized weakness, nausea and vomiting EXAM: PORTABLE CHEST 1 VIEW COMPARISON:  07/19/2018 FINDINGS: Heart size and pulmonary vascularity are normal. Slight fibrosis or atelectasis in the left lung base. No consolidation or airspace disease. No pleural effusions. No pneumothorax. Mediastinal contours appear intact. IMPRESSION: Slight fibrosis or atelectasis in the left lung base. No evidence of active pulmonary disease. Electronically Signed   By: Burman Nieves M.D.   On: 04/28/2020 21:41    Procedures Procedures (including critical care time)  Medications Ordered in ED Medications  lactated ringers bolus 1,000 mL (has no administration in time range)  pantoprazole (PROTONIX) 80 mg in sodium chloride 0.9 % 100 mL IVPB ( Intravenous Rate/Dose Verify 04/28/20 2157)    ED Course  I have reviewed the triage vital signs and the nursing notes.  Pertinent labs & imaging results that were available during my care of the patient were reviewed by me and considered in my medical decision making (see chart for details).    MDM Rules/Calculators/A&P                          64 year old female with what is  clinically an upper GI bleed.  Hemoglobin is 12.9 but historically she actually seems to be mildly polycythemic.  She has had increasing fatigue that is worse with exertion.  Resting tachycardia.  Melanotic stool.  Vague abdominal pain.  BUN 32 with a creatinine of 1.08. Leukocytosis reactive?  IV established.  Type and screen.  Will bolus IV fluids.  Protonix.  Non-emergent GI consultation.  Will likely end up needing EGD.  Will discuss with medicine service for admission.  Final Clinical Impression(s) / ED Diagnoses Final diagnoses:  Upper GI bleed    Rx / DC Orders ED Discharge Orders    None       Raeford Razor, MD 04/28/20 2219

## 2020-04-28 NOTE — H&P (Signed)
[facility] H&P   Patient Demographics:    Nicole Welch, is a 64 y.o. female  MRN: [id number]   DOB - [birth year]  Admit Date - 04/28/2020  Outpatient Primary MD for the patient is Elfredia Nevins, MD  Referring MD/NP/PA: Dr Juleen China  Patient coming from: home  Chief Complaint  Patient presents with  . Weakness      HPI:    Nicole Welch  is a 64 y.o. female, medical history of anxiety, chronic back pain, osteoporosis, chronic diastolic CHF, [facility] to ED secondary to complaints of generalized weakness and melena, patient reports overall generalized weakness and fatigue for last 3 days, she does report an episode of melena started yesterday, another episode this morning, she does report nausea and vomiting earlier today, reports is nonbilious(she did vomit purple gummy bears she ate earlier today), patient reports she takes Mobic occasionally, she last dose was before 2 days, she is on aspirin 81 mg oral daily chronically, report she has been taking 325 mg recently that was available bottle she had(reports she took full dose x2 earlier in the week, but then she has been splitting the pill into 4 Richard equal 81 mg daily for last 4 days), patient denies any history of GI bleed in the past, as well she reports denies any history of endoscopy or colonoscopy in the past, reports she had that paper test done in the past to rule out cancer by her PCP(most likely she means Cologuard test). - in ED leukocytosis of 20,000, denies fever, chills dysuria, no evidence of acute cardiopulmonary disease on chest x-ray, she had negative UA, her hemoglobin is 12.9, she has polycythemia around baseline with hemoglobin around 16, creatinine was 1.08, potassium of 5, she was tachycardic in 110's, [facility] hospitalist consulted to admit.    Review of systems:    In addition to the HPI above,  No Fever-chills, No  Headache, No changes with Vision or hearing, No problems swallowing food or Liquids, No Chest pain, Cough or Shortness of Breath, Presents with melena, she had an episode of nausea and vomiting earlier today, noncoffee-ground, . No Blood in stool or Urine, No dysuria, No new skin rashes or bruises, No new joints pains-aches,  No new weakness, tingling, numbness in any extremity, No recent weight gain or loss, No polyuria, polydypsia or polyphagia, No significant Mental Stressors.  A full 10 point Review of Systems was done, except as stated above, all other Review of Systems were negative.   With Past History of the following :    Past Medical History:  Diagnosis Date  . Anxiety   . Back pain   . Chronic neck pain   . DDD (degenerative disc disease), cervical   . Depression   . DJD (degenerative joint disease)   . Gait abnormality   . Osteoporosis       Past Surgical History:  Procedure Laterality  Date  . ABDOMINAL HYSTERECTOMY    . CESAREAN SECTION    . TUBAL LIGATION        Social History:     Social History   Tobacco Use  . Smoking status: Former Smoker    Packs/day: 1.00    Types: Cigarettes  . Smokeless tobacco: Never Used  Substance Use Topics  . Alcohol use: No       Family History :     Family History  Problem Relation Age of Onset  . Heart disease Other        family history   . Arthritis Other        family history   . CAD Father        14's     Home Medications:   Prior to Admission medications   Medication Sig Start Date End Date Taking? Authorizing Provider  ALPRAZolam Prudy Feeler) 0.5 MG tablet Take 0.5 mg by mouth 3 (three) times daily as needed for anxiety. 02/10/20  Yes [provider]  aspirin EC 81 MG EC tablet Take 1 tablet (81 mg total) by mouth daily. 07/24/18  Yes Dhungel, Nishant, MD  Calcium Carb-Cholecalciferol (CALCIUM + D3) 600-200 MG-UNIT TABS Take 1 tablet by mouth daily.    Yes [provider]   cyclobenzaprine (FLEXERIL) 10 MG tablet Take 1 tablet by mouth 2 (two) times daily as needed for muscle spasms. 03/25/20  Yes [provider]  ezetimibe (ZETIA) 10 MG tablet Take 10 mg by mouth at bedtime. 06/01/18  Yes [provider]  furosemide (LASIX) 40 MG tablet Take 60 mg by mouth 2 (two) times daily.    Yes [provider]  oxyCODONE-acetaminophen (PERCOCET) 5-325 MG per tablet Take 1 tablet by mouth every 6 (six) hours as needed. Back pain 10/21/10  Yes [provider]  potassium chloride (KLOR-CON) 20 MEQ packet Take 30 mEq by mouth daily. 07/23/18  Yes Dhungel, Nishant, MD  simvastatin (ZOCOR) 40 MG tablet Take 40 mg by mouth at bedtime. 04/26/18  Yes [provider]  nicotine (NICODERM CQ - DOSED IN MG/24 HR) 7 mg/24hr patch Place 1 patch (7 mg total) onto the skin daily. 07/24/18   Dhungel, Theda Belfast, MD     Allergies:    No Known Allergies   Physical Exam:   Vitals  Blood pressure (!) 114/56, pulse (!) 108, temperature 98.3 F (36.8 C), temperature source Oral, resp. rate 20, height 5\' 5"  (1.651 m), weight 77.1 kg, SpO2 96 %.   1. General developed female, laying in bed, in no apparent distress  2. Normal affect and insight, Not Suicidal or Homicidal, Awake Alert, Oriented X 3.  3. No F.N deficits, ALL C.Nerves Intact, Strength 5/5 all 4 extremities, Sensation intact all 4 extremities, Plantars down going.  4. Ears and Eyes appear Normal, Conjunctivae clear, PERRLA. Moist Oral Mucosa.  5. Supple Neck, No JVD, No cervical lymphadenopathy appriciated, No Carotid Bruits.  6. Symmetrical Chest wall movement, Good air movement bilaterally, CTAB.  7. RRR, No Gallops, Rubs or Murmurs, No Parasternal Heave.  8. Positive Bowel Sounds, Abdomen Soft, No tenderness, No organomegaly appriciated,No rebound -guarding or rigidity.  9.  No Cyanosis, Normal Skin Turgor, No Skin Rash or Bruise.  10. Good muscle tone,  joints appear normal , no  effusions, Normal ROM.  11. No Palpable Lymph Nodes in Neck or Axillae    Data Review:    CBC Recent Labs  Lab 04/28/20 2035  WBC 20.3*  HGB  12.9  HCT 39.9  PLT 532*  MCV 99.8  MCH 32.3  MCHC 32.3  RDW 12.5  LYMPHSABS 1.1  MONOABS 0.7  EOSABS 0.0  BASOSABS 0.1   ------------------------------------------------------------------------------------------------------------------  Chemistries  Recent Labs  Lab 04/28/20 2035  NA 135  K 5.0  CL 98  CO2 26  GLUCOSE 186*  BUN 32*  CREATININE 1.08*  CALCIUM 8.6*  AST 15  ALT 11  ALKPHOS 86  BILITOT 0.5   ------------------------------------------------------------------------------------------------------------------ estimated creatinine clearance is 54 mL/min (A) (by C-G formula based on SCr of 1.08 mg/dL (H)). ------------------------------------------------------------------------------------------------------------------ No results for input(s): TSH, T4TOTAL, T3FREE, THYROIDAB in the last 72 hours.  Invalid input(s): FREET3  Coagulation profile No results for input(s): INR, PROTIME in the last 168 hours. ------------------------------------------------------------------------------------------------------------------- No results for input(s): DDIMER in the last 72 hours. -------------------------------------------------------------------------------------------------------------------  Cardiac Enzymes No results for input(s): CKMB, TROPONINI, MYOGLOBIN in the last 168 hours.  Invalid input(s): CK ------------------------------------------------------------------------------------------------------------------    Component Value Date/Time   BNP 1,970.0 (H) 07/13/2018 1403     ---------------------------------------------------------------------------------------------------------------  Urinalysis    Component Value Date/Time   COLORURINE YELLOW 04/28/2020 2020   APPEARANCEUR HAZY (A) 04/28/2020 2020    LABSPEC 1.012 04/28/2020 2020   PHURINE 6.0 04/28/2020 2020   GLUCOSEU NEGATIVE 04/28/2020 2020   HGBUR LARGE (A) 04/28/2020 2020   BILIRUBINUR NEGATIVE 04/28/2020 2020   KETONESUR NEGATIVE 04/28/2020 2020   PROTEINUR NEGATIVE 04/28/2020 2020   UROBILINOGEN 0.2 04/22/2014 1810   NITRITE NEGATIVE 04/28/2020 2020   LEUKOCYTESUR MODERATE (A) 04/28/2020 2020    ----------------------------------------------------------------------------------------------------------------   Imaging Results:    DG Chest Portable 1 View  Result Date: 04/28/2020 CLINICAL DATA:  Hypoxemia, generalized weakness, nausea and vomiting EXAM: PORTABLE CHEST 1 VIEW COMPARISON:  07/19/2018 FINDINGS: Heart size and pulmonary vascularity are normal. Slight fibrosis or atelectasis in the left lung base. No consolidation or airspace disease. No pleural effusions. No pneumothorax. Mediastinal contours appear intact. IMPRESSION: Slight fibrosis or atelectasis in the left lung base. No evidence of active pulmonary disease. Electronically Signed   By: Burman NievesWilliam  Stevens M.D.   On: 04/28/2020 21:41      Assessment & Plan:    Active Problems:   Anxiety   Acute diastolic CHF (congestive heart failure) (HCC)   GI bleed   GI bleed -Patient presents with melena, this is most likely in the setting of upper GI bleed, given she is taking Mobic as needed, admits likely was taking full dose aspirin. -She presents with melena, Hemoccult positive, at baseline she is with polycythemia, hemoglobin on admission is 12.9, will monitor closely, so far no indication for transfusion. -Hold aspirin and Mobic for now, she started on Protonix drip, will keep on clear liquid diet, and SCD for DVT prophylaxis. -GI consult requested on epic, she never had endoscopy or colonoscopy in the past.  Leukocytosis -Afebrile, nontoxic-appearing, mostly likely reactive, no evidence of infection and chest x-ray, has negative UA  Chronic diastolic  CHF -Lasix on hold given she is tachycardic, she is on IV fluids, will monitor closely for volume overload  Hyperlipidemia -Continue with statin, resume Zetia when stable  Anxiety -Continue home medications  DVT Prophylaxis  SCDs   AM Labs Ordered, also please review Full Orders  Family Communication: Admission, patients condition and plan of care including tests being ordered have been discussed with the patient and daughter  who indicate understanding and agree with the plan and Code Status.  Code Status Full  Likely DC to Home  Condition GUARDED    Consults called: GI requested in EPIC  Admission status: observation  Time spent in minutes : 50 minutes   Huey Bienenstock M.D on 04/28/2020 at 11:49 PM   [facility] - Office  [phone]

## 2020-04-28 NOTE — ED Triage Notes (Signed)
Pt here c/o generalized weakness, black stools, N/V. Pt denies hx of same and denies any exposures to COVID.

## 2020-04-29 ENCOUNTER — Encounter (HOSPITAL_COMMUNITY): Payer: Self-pay | Admitting: Internal Medicine

## 2020-04-29 ENCOUNTER — Observation Stay (HOSPITAL_COMMUNITY): Payer: Medicare HMO | Admitting: Anesthesiology

## 2020-04-29 ENCOUNTER — Encounter (HOSPITAL_COMMUNITY)
Admission: EM | Disposition: A | Discharge status: Home / Self Care | Payer: Self-pay | Source: Home / Self Care | Attending: Internal Medicine

## 2020-04-29 DIAGNOSIS — Z20822 Contact with and (suspected) exposure to covid-19: Secondary | ICD-10-CM | POA: Diagnosis present

## 2020-04-29 DIAGNOSIS — K274 Chronic or unspecified peptic ulcer, site unspecified, with hemorrhage: Secondary | ICD-10-CM | POA: Diagnosis not present

## 2020-04-29 DIAGNOSIS — I5031 Acute diastolic (congestive) heart failure: Secondary | ICD-10-CM

## 2020-04-29 DIAGNOSIS — K921 Melena: Secondary | ICD-10-CM | POA: Diagnosis not present

## 2020-04-29 DIAGNOSIS — K264 Chronic or unspecified duodenal ulcer with hemorrhage: Secondary | ICD-10-CM | POA: Diagnosis present

## 2020-04-29 DIAGNOSIS — E538 Deficiency of other specified B group vitamins: Secondary | ICD-10-CM | POA: Diagnosis present

## 2020-04-29 DIAGNOSIS — K316 Fistula of stomach and duodenum: Secondary | ICD-10-CM | POA: Diagnosis present

## 2020-04-29 DIAGNOSIS — I5033 Acute on chronic diastolic (congestive) heart failure: Secondary | ICD-10-CM | POA: Diagnosis present

## 2020-04-29 DIAGNOSIS — D62 Acute posthemorrhagic anemia: Secondary | ICD-10-CM | POA: Diagnosis present

## 2020-04-29 DIAGNOSIS — K5909 Other constipation: Secondary | ICD-10-CM | POA: Diagnosis present

## 2020-04-29 DIAGNOSIS — F411 Generalized anxiety disorder: Secondary | ICD-10-CM | POA: Diagnosis present

## 2020-04-29 DIAGNOSIS — R000 Tachycardia, unspecified: Secondary | ICD-10-CM | POA: Diagnosis not present

## 2020-04-29 DIAGNOSIS — Z791 Long term (current) use of non-steroidal anti-inflammatories (NSAID): Secondary | ICD-10-CM | POA: Diagnosis not present

## 2020-04-29 DIAGNOSIS — K922 Gastrointestinal hemorrhage, unspecified: Secondary | ICD-10-CM | POA: Diagnosis present

## 2020-04-29 DIAGNOSIS — G8929 Other chronic pain: Secondary | ICD-10-CM | POA: Diagnosis present

## 2020-04-29 DIAGNOSIS — R531 Weakness: Secondary | ICD-10-CM | POA: Diagnosis not present

## 2020-04-29 DIAGNOSIS — E785 Hyperlipidemia, unspecified: Secondary | ICD-10-CM | POA: Diagnosis present

## 2020-04-29 DIAGNOSIS — F419 Anxiety disorder, unspecified: Secondary | ICD-10-CM | POA: Diagnosis not present

## 2020-04-29 DIAGNOSIS — Z87891 Personal history of nicotine dependence: Secondary | ICD-10-CM | POA: Diagnosis not present

## 2020-04-29 DIAGNOSIS — K449 Diaphragmatic hernia without obstruction or gangrene: Secondary | ICD-10-CM

## 2020-04-29 DIAGNOSIS — I774 Celiac artery compression syndrome: Secondary | ICD-10-CM | POA: Diagnosis not present

## 2020-04-29 DIAGNOSIS — K3189 Other diseases of stomach and duodenum: Secondary | ICD-10-CM | POA: Diagnosis not present

## 2020-04-29 DIAGNOSIS — J9601 Acute respiratory failure with hypoxia: Secondary | ICD-10-CM | POA: Diagnosis not present

## 2020-04-29 DIAGNOSIS — Z7982 Long term (current) use of aspirin: Secondary | ICD-10-CM | POA: Diagnosis not present

## 2020-04-29 DIAGNOSIS — M549 Dorsalgia, unspecified: Secondary | ICD-10-CM | POA: Diagnosis present

## 2020-04-29 DIAGNOSIS — I509 Heart failure, unspecified: Secondary | ICD-10-CM | POA: Diagnosis not present

## 2020-04-29 DIAGNOSIS — R05 Cough: Secondary | ICD-10-CM | POA: Diagnosis not present

## 2020-04-29 DIAGNOSIS — M810 Age-related osteoporosis without current pathological fracture: Secondary | ICD-10-CM | POA: Diagnosis present

## 2020-04-29 DIAGNOSIS — I959 Hypotension, unspecified: Secondary | ICD-10-CM | POA: Diagnosis not present

## 2020-04-29 DIAGNOSIS — K2970 Gastritis, unspecified, without bleeding: Secondary | ICD-10-CM | POA: Diagnosis present

## 2020-04-29 DIAGNOSIS — Z79899 Other long term (current) drug therapy: Secondary | ICD-10-CM | POA: Diagnosis not present

## 2020-04-29 DIAGNOSIS — Z8249 Family history of ischemic heart disease and other diseases of the circulatory system: Secondary | ICD-10-CM | POA: Diagnosis not present

## 2020-04-29 DIAGNOSIS — D751 Secondary polycythemia: Secondary | ICD-10-CM | POA: Diagnosis present

## 2020-04-29 HISTORY — PX: ESOPHAGOGASTRODUODENOSCOPY (EGD) WITH PROPOFOL: SHX5813

## 2020-04-29 LAB — RETICULOCYTES
Immature Retic Fract: 31.1 % — ABNORMAL HIGH (ref 2.3–15.9)
RBC.: 3.03 MIL/uL — ABNORMAL LOW (ref 3.87–5.11)
Retic Count, Absolute: 85.1 10*3/uL (ref 19.0–186.0)
Retic Ct Pct: 2.8 % (ref 0.4–3.1)

## 2020-04-29 LAB — CBC
HCT: 33.3 % — ABNORMAL LOW (ref 36.0–46.0)
Hemoglobin: 10.5 g/dL — ABNORMAL LOW (ref 12.0–15.0)
MCH: 31.9 pg (ref 26.0–34.0)
MCHC: 31.5 g/dL (ref 30.0–36.0)
MCV: 101.2 fL — ABNORMAL HIGH (ref 80.0–100.0)
Platelets: 469 10*3/uL — ABNORMAL HIGH (ref 150–400)
RBC: 3.29 MIL/uL — ABNORMAL LOW (ref 3.87–5.11)
RDW: 12.6 % (ref 11.5–15.5)
WBC: 17.7 10*3/uL — ABNORMAL HIGH (ref 4.0–10.5)
nRBC: 0 % (ref 0.0–0.2)

## 2020-04-29 LAB — BASIC METABOLIC PANEL
Anion gap: 10 (ref 5–15)
BUN: 43 mg/dL — ABNORMAL HIGH (ref 8–23)
CO2: 30 mmol/L (ref 22–32)
Calcium: 8.5 mg/dL — ABNORMAL LOW (ref 8.9–10.3)
Chloride: 101 mmol/L (ref 98–111)
Creatinine, Ser: 0.83 mg/dL (ref 0.44–1.00)
GFR calc Af Amer: >60 mL/min (ref >60)
GFR calc non Af Amer: >60 mL/min (ref >60)
Glucose, Bld: 101 mg/dL — ABNORMAL HIGH (ref 70–99)
Potassium: 4.8 mmol/L (ref 3.5–5.1)
Sodium: 141 mmol/L (ref 135–145)

## 2020-04-29 LAB — IRON AND TIBC
Iron: 29 ug/dL (ref 28–170)
Saturation Ratios: 12 % (ref 10.4–31.8)
TIBC: 247 ug/dL — ABNORMAL LOW (ref 250–450)
UIBC: 218 ug/dL

## 2020-04-29 LAB — LACTIC ACID, PLASMA
Lactic Acid, Venous: 1.2 mmol/L (ref 0.5–1.9)
Lactic Acid, Venous: 1.7 mmol/L (ref 0.5–1.9)

## 2020-04-29 LAB — VITAMIN B12: Vitamin B-12: 101 pg/mL — ABNORMAL LOW (ref 180–914)

## 2020-04-29 LAB — HIV ANTIBODY (ROUTINE TESTING W REFLEX): HIV Screen 4th Generation wRfx: NONREACTIVE

## 2020-04-29 LAB — FOLATE: Folate: 9.7 ng/mL (ref >5.9)

## 2020-04-29 LAB — PROTIME-INR
INR: 1.2 (ref 0.8–1.2)
Prothrombin Time: 14.6 seconds (ref 11.4–15.2)

## 2020-04-29 LAB — FERRITIN: Ferritin: 65 ng/mL (ref 11–307)

## 2020-04-29 SURGERY — ESOPHAGOGASTRODUODENOSCOPY (EGD) WITH PROPOFOL
Anesthesia: General

## 2020-04-29 MED ORDER — LACTATED RINGERS IV SOLN: Freq: Once | INTRAVENOUS | Status: DC

## 2020-04-29 MED ORDER — PROPOFOL 500 MG/50ML IV EMUL: INTRAVENOUS | Status: DC | PRN

## 2020-04-29 MED ORDER — SUCRALFATE 1 GM/10ML PO SUSP
1.0000 g | Freq: Three times a day (TID) | ORAL | Status: DC
  Administered 2020-04-29 – 2020-05-05 (×23): 1 g via ORAL
  Filled 2020-04-29 (×23): qty 10

## 2020-04-29 MED ORDER — LIDOCAINE HCL (CARDIAC) PF 100 MG/5ML IV SOSY
PREFILLED_SYRINGE | INTRAVENOUS | Status: DC | PRN
Start: 2020-04-29 — End: 2020-04-29
  Administered 2020-04-29: 60 mg via INTRATRACHEAL

## 2020-04-29 MED ORDER — LIDOCAINE VISCOUS HCL 2 % MT SOLN
OROMUCOSAL | Status: AC
  Filled 2020-04-29: qty 15

## 2020-04-29 MED ORDER — PANTOPRAZOLE SODIUM 40 MG IV SOLR
INTRAVENOUS | Status: AC
  Filled 2020-04-29: qty 80

## 2020-04-29 MED ORDER — LACTATED RINGERS IV SOLN: INTRAVENOUS | Status: DC | PRN

## 2020-04-29 MED ORDER — GLYCOPYRROLATE PF 0.2 MG/ML IJ SOSY
PREFILLED_SYRINGE | INTRAMUSCULAR | Status: AC
  Filled 2020-04-29: qty 1

## 2020-04-29 MED ORDER — FUROSEMIDE 40 MG PO TABS
60.0000 mg | ORAL_TABLET | Freq: Every day | ORAL | Status: DC
  Filled 2020-04-29: qty 1

## 2020-04-29 MED ORDER — SODIUM CHLORIDE 0.9 % IV SOLN: INTRAVENOUS | Status: DC

## 2020-04-29 MED ORDER — PROPOFOL 10 MG/ML IV BOLUS
INTRAVENOUS | Status: AC
  Filled 2020-04-29: qty 40

## 2020-04-29 MED ORDER — PROPOFOL 500 MG/50ML IV EMUL
INTRAVENOUS | Status: DC | PRN
  Administered 2020-04-29: 50 ug/kg/min via INTRAVENOUS

## 2020-04-29 MED ORDER — PHENYLEPHRINE 40 MCG/ML (10ML) SYRINGE FOR IV PUSH (FOR BLOOD PRESSURE SUPPORT)
PREFILLED_SYRINGE | INTRAVENOUS | Status: DC | PRN
  Administered 2020-04-29: 80 ug via INTRAVENOUS

## 2020-04-29 MED ORDER — PROPOFOL 10 MG/ML IV BOLUS
INTRAVENOUS | Status: DC | PRN
  Administered 2020-04-29: 20 mg via INTRAVENOUS

## 2020-04-29 MED ORDER — ETOMIDATE 2 MG/ML IV SOLN
INTRAVENOUS | Status: DC | PRN
Start: 2020-04-29 — End: 2020-04-29
  Administered 2020-04-29: 4 mg via INTRAVENOUS

## 2020-04-29 MED ORDER — LIDOCAINE VISCOUS HCL 2 % MT SOLN
OROMUCOSAL | Status: DC | PRN
  Administered 2020-04-29: 1 via OROMUCOSAL

## 2020-04-29 MED ORDER — LIDOCAINE 2% (20 MG/ML) 5 ML SYRINGE
INTRAMUSCULAR | Status: AC
  Filled 2020-04-29: qty 5

## 2020-04-29 NOTE — Consult Note (Signed)
Referring Provider: Standley Dakins, MD Primary Care Physician:  Elfredia Nevins, MD Primary Gastroenterologist:  Dr. Karilyn Cota  Reason for Consultation:   Melena and anemia.  HPI:   Patient is 64 year old Caucasian female who has history of chronic low back pain and CHF presumably due to leaky valve who was in usual state of health until 9 days ago when she fell coming out of sling full.  Since then she has had soreness over her rib cage as well as abdomen.  Her daughter Shanda Bumps states that last week she took full dose aspirin for 2 days in row and then back to one fourth of her aspirin.  She began to feel weak 5 or 6 days ago.  She has been constipated for few days.  She took doses of polyethylene glycol without any results.  5 days ago she took a suppository had a black bowel movement.  Since then she has noted soreness due to hemorrhoids.  She has not had rectal bleeding.  Her stools have been black.  She also has noted poor appetite and weakness is progressive.  Yesterday she was complaining of epigastric pain and her daughter noted her heart rate to be 120.  Oxygen saturation was 92 or higher.  She was therefore brought to emergency room for evaluation.  Her stool was black and guaiac positive.  Her hemoglobin was 12.9 but felt to be significantly lower than her baseline hemoglobin.  Patient was begun on enteral salt infusion.  Her hemoglobin from this morning has decreased to 10.5.  She says she did have a bowel movement this morning it was not black. Patient says she is hungry.  She complains of mild discomfort in epigastrium right upper quadrant.  She did vomit once while in the emergency room.  She just vomited candy.  No hematemesis.  She has lost few pounds since her symptoms began because of poor appetite.  She has chronic constipation.  She takes polyethylene glycol on as-needed basis.  Last weekend she took 2 doses of MiraLAX and finally had to take a suppository 5 days ago when her bowels  move.  She has noted some hemorrhoidal soreness since then.  No history of rectal bleeding. Patient has chronic low back pain.  She is on Mobic 15 mg daily and she takes oxycodone with acetaminophen to 3 doses every day. Patient's daughter states that when she is feeling well she does the usual household work.  Patient states she has been under a lot of stress coming from her husband.  Patient is also raising a stepson who is 85 years old and is autistic. She has 1 daughter.  She states she quit cigarette smoking 3 weeks ago.  Prior to that she has been smoking a pack a day for several years.  She rarely drinks alcohol. Father had coronary artery disease.  He died at age 80.  Mother died at age 39.  She had atrial fibrillation and was on blood thinner.  Has 1 sister age 1 with multiple health problems. Patient worked at a [employer] for over 11 years.  She has not worked since 2011 or 2012.  Past Medical History:  Diagnosis Date  . Anxiety   . Back pain   . Chronic neck pain   . DDD (degenerative disc disease), cervical   . Depression   . DJD (degenerative joint disease)   . Gait abnormality   . Osteoporosis        History of congestive heart failure hospitalized  in July 11, 2018.  Past Surgical History:  Procedure Laterality Date  . ABDOMINAL HYSTERECTOMY    . CESAREAN SECTION    . TUBAL LIGATION          Right inguinal herniorrhaphy.  Prior to Admission medications   Medication Sig Start Date End Date Taking? Authorizing Provider  ALPRAZolam Prudy Feeler) 0.5 MG tablet Take 0.5 mg by mouth 3 (three) times daily as needed for anxiety. 02/10/20  Yes [provider]  aspirin EC 81 MG EC tablet Take 1 tablet (81 mg total) by mouth daily. 07/24/18  Yes Dhungel, Nishant, MD  Calcium Carb-Cholecalciferol (CALCIUM + D3) 600-200 MG-UNIT TABS Take 1 tablet by mouth daily.    Yes [provider]  cyclobenzaprine (FLEXERIL) 10 MG tablet Take 1 tablet by mouth 2 (two) times  daily as needed for muscle spasms. 03/25/20  Yes [provider]  ezetimibe (ZETIA) 10 MG tablet Take 10 mg by mouth at bedtime. 06/01/18  Yes [provider]  furosemide (LASIX) 40 MG tablet Take 60 mg by mouth 2 (two) times daily.    Yes [provider]  oxyCODONE-acetaminophen (PERCOCET) 5-325 MG per tablet Take 1 tablet by mouth every 6 (six) hours as needed. Back pain 10/21/10  Yes [provider]  potassium chloride (KLOR-CON) 20 MEQ packet Take 30 mEq by mouth daily. 07/23/18  Yes Dhungel, Nishant, MD  simvastatin (ZOCOR) 40 MG tablet Take 40 mg by mouth at bedtime. 04/26/18  Yes [provider]  nicotine (NICODERM CQ - DOSED IN MG/24 HR) 7 mg/24hr patch Place 1 patch (7 mg total) onto the skin daily. 07/24/18   Dhungel, Theda Belfast, MD    Current Facility-Administered Medications  Medication Dose Route Frequency Provider Last Rate Last Admin  . 0.9 %  sodium chloride infusion   Intravenous Continuous Elgergawy, Leana Roe, MD 50 mL/hr at 04/29/20 0004 New Bag at 04/29/20 0004  . acetaminophen (TYLENOL) tablet 650 mg  650 mg Oral Q6H PRN Elgergawy, Leana Roe, MD       Or  . acetaminophen (TYLENOL) suppository 650 mg  650 mg Rectal Q6H PRN Elgergawy, Leana Roe, MD      . ALPRAZolam Prudy Feeler) tablet 0.5 mg  0.5 mg Oral TID PRN Elgergawy, Leana Roe, MD   0.5 mg at 04/28/20 2238  . calcium-vitamin D (OSCAL WITH D) 500-200 MG-UNIT per tablet 1 tablet  1 tablet Oral Daily Elgergawy, Leana Roe, MD      . cyclobenzaprine (FLEXERIL) tablet 10 mg  10 mg Oral BID PRN Elgergawy, Leana Roe, MD      . nicotine (NICODERM CQ - dosed in mg/24 hr) patch 7 mg  7 mg Transdermal Daily Elgergawy, Leana Roe, MD      . ondansetron (ZOFRAN) tablet 4 mg  4 mg Oral Q6H PRN Elgergawy, Leana Roe, MD       Or  . ondansetron (ZOFRAN) injection 4 mg  4 mg Intravenous Q6H PRN Elgergawy, Leana Roe, MD      . oxyCODONE-acetaminophen (PERCOCET/ROXICET) 5-325 MG per tablet 1 tablet  1 tablet Oral Q6H  PRN Elgergawy, Leana Roe, MD   1 tablet at 04/28/20 2238  . pantoprazole (PROTONIX) 80 mg in sodium chloride 0.9 % 100 mL (0.8 mg/mL) infusion  8 mg/hr Intravenous Continuous Elgergawy, Leana Roe, MD 10 mL/hr at 04/29/20 0005 8 mg/hr at 04/29/20 0005  . [START ON 05/02/2020] pantoprazole (PROTONIX) injection 40 mg  40 mg Intravenous Q12H Elgergawy, Leana Roe, MD      .  potassium chloride (KLOR-CON) packet 30 mEq  30 mEq Oral Daily Elgergawy, Leana Roeawood S, MD      . simvastatin (ZOCOR) tablet 40 mg  40 mg Oral QHS Elgergawy, Leana Roeawood S, MD   40 mg at 04/29/20 0018    Allergies as of 04/28/2020  . (No Known Allergies)    Family History  Problem Relation Age of Onset  . Heart disease Other        family history   . Arthritis Other        family history   . CAD Father        8460's    Social History   Socioeconomic History  . Marital status: Married    Spouse name: Not on file  . Number of children: Not on file  . Years of education: ged   . Highest education level: Not on file  Occupational History  . Occupation: Set designermanufacturing work   Tobacco Use  . Smoking status: Former Smoker    Packs/day: 1.00    Types: Cigarettes  . Smokeless tobacco: Never Used  Vaping Use  . Vaping Use: Never used  Substance and Sexual Activity  . Alcohol use: No  . Drug use: No  . Sexual activity: Not on file  Other Topics Concern  . Not on file  Social History Narrative  . Not on file   Social Determinants of Health   Financial Resource Strain:   . Difficulty of Paying Living Expenses:   Food Insecurity:   . Worried About Programme researcher, broadcasting/film/videounning Out of Food in the Last Year:   . Baristaan Out of Food in the Last Year:   Transportation Needs:   . Freight forwarderLack of Transportation (Medical):   Marland Kitchen. Lack of Transportation (Non-Medical):   Physical Activity:   . Days of Exercise per Week:   . Minutes of Exercise per Session:   Stress:   . Feeling of Stress :   Social Connections:   . Frequency of Communication with Friends and  Family:   . Frequency of Social Gatherings with Friends and Family:   . Attends Religious Services:   . Active Member of Clubs or Organizations:   . Attends BankerClub or Organization Meetings:   Marland Kitchen. Marital Status:   Intimate Partner Violence:   . Fear of Current or Ex-Partner:   . Emotionally Abused:   Marland Kitchen. Physically Abused:   . Sexually Abused:     Review of Systems: See HPI, otherwise normal ROS  Physical Exam: Temp:  [98.3 F (36.8 C)-98.7 F (37.1 C)] 98.4 F (36.9 C) (08/08 0536) Pulse Rate:  [105-128] 105 (08/08 0536) Resp:  [16-33] 16 (08/08 0536) BP: (93-151)/(50-97) 104/56 (08/08 0536) SpO2:  [91 %-96 %] 91 % (08/08 0536) Weight:  [77 kg-77.1 kg] 77 kg (08/07 2350) Last BM Date: 04/28/20  Patient is alert and in no acute distress. Conjunctiva is pink.  Sclerae nonicteric. Oropharyngeal mucosa is dry.  She is edentulous.  She does have dentures that she is not wearing at the present time. Neck without masses thyromegaly or lymphadenopathy. Cardiac exam with regular rhythm normal S1 and S2.  She has grade 2/6 systolic murmur best heard at aortic area. Auscultation lungs reveal vesicular breath sounds bilaterally. Abdomen is full with Pfannenstiel scar.  Bowel sounds are normal.  On palpation abdomen is soft with mild tenderness in epigastrium and below the right costal margin.  No organomegaly or masses. She does not have peripheral edema clubbing or koilonychia.  Lab Results: Recent Labs  04/28/20 2035 04/29/20 0706  WBC 20.3* 17.7*  HGB 12.9 10.5*  HCT 39.9 33.3*  PLT 532* 469*   BMET Recent Labs    04/28/20 2035 04/29/20 0706  NA 135 141  K 5.0 4.8  CL 98 101  CO2 26 30  GLUCOSE 186* 101*  BUN 32* 43*  CREATININE 1.08* 0.83  CALCIUM 8.6* 8.5*   LFT Recent Labs    04/28/20 2035  PROT 7.7  ALBUMIN 3.0*  AST 15  ALT 11  ALKPHOS 86  BILITOT 0.5   PT/INR Recent Labs    04/29/20 0706  LABPROT 14.6  INR 1.2   Hepatitis Panel No results for  input(s): HEPBSAG, HCVAB, HEPAIGM, HEPBIGM in the last 72 hours.  Studies/Results: DG Chest Portable 1 View  Result Date: 04/28/2020 CLINICAL DATA:  Hypoxemia, generalized weakness, nausea and vomiting EXAM: PORTABLE CHEST 1 VIEW COMPARISON:  07/19/2018 FINDINGS: Heart size and pulmonary vascularity are normal. Slight fibrosis or atelectasis in the left lung base. No consolidation or airspace disease. No pleural effusions. No pneumothorax. Mediastinal contours appear intact. IMPRESSION: Slight fibrosis or atelectasis in the left lung base. No evidence of active pulmonary disease. Electronically Signed   By: Burman Nieves M.D.   On: 04/28/2020 21:41    Assessment;  Patient is 64 year old Caucasian female with history of CHF, chronic low back pain on NSAID and low-dose aspirin who presents with few day history of progressive weakness melena and anemia.  She generally runs hemoglobin in the range of 16 g and this morning it is 10.5 g. I suspect she has peptic ulcer disease secondary to chronic NSAID use. She has never been screened for [facility] which she should consider at a later date.  History of CHF.  She appears to be compensated.  Etiology unclear.  ECHO results noted from October 2019 study.  She does have systolic murmur.  Leukocyturia.  Patient has no urinary symptoms.  Culture is pending.  Recommendations;  Diagnostic and therapeutic esophagogastroduodenoscopy. Patient will need monitored anesthesia care for this examination. Procedure and risks risk reviewed with the patient and her daughter Shanda Bumps was at bedside and they both agree. Patient will need to be n.p.o. for 2 hours before we can proceed.    LOS: 0 days   Shealynn Saulnier  04/29/2020, 9:18 AM

## 2020-04-29 NOTE — Op Note (Signed)
[facility] Patient Name: Nicole Welch Procedure Date: 04/29/2020 10:52 AM MRN: [id number] Date of Birth: [birth year] Attending MD: Lionel December , MD CSN: [id number] Age: 64 Admit Type: Inpatient Procedure:                Upper GI endoscopy Indications:              Melena Providers:                Lionel December, MD, Crystal Page, Nena Polio, RN,                            Ina Homes, Technician Referring MD:             Standley Dakins, MD Medicines:                Propofol per Anesthesia Complications:            No immediate complications. Estimated Blood Loss:     Estimated blood loss: none. Procedure:                Pre-Anesthesia Assessment:                           - Prior to the procedure, a History and Physical                            was performed, and patient medications and                            allergies were reviewed. The patient's tolerance of                            previous anesthesia was also reviewed. The risks                            and benefits of the procedure and the sedation                            options and risks were discussed with the patient.                            All questions were answered, and informed consent                            was obtained. Prior Anticoagulants: low dose                            Aspirin ans Meloxicam. ASA Grade Assessment: IV - A                            patient with severe systemic disease that is a                            constant threat to life. After reviewing the risks  and benefits, the patient was deemed in                            satisfactory condition to undergo the procedure.                           After obtaining informed consent, the endoscope was                            passed under direct vision. Throughout the                            procedure, the patient's blood pressure, pulse, and                            oxygen saturations  were monitored continuously. The                            [id number]-[id number] ([id number] scope was introduced through the                            mouth, and advanced to the second part of duodenum.                            The upper GI endoscopy was accomplished without                            difficulty. The patient tolerated the procedure                            well. Scope In: 11:35:29 AM Scope Out: 11:42:35 AM Total Procedure Duration: 0 hours 7 minutes 6 seconds  Findings:      The hypopharynx was normal.      The examined esophagus was normal.      The Z-line was regular and was found 36 cm from the incisors.      A 2 cm hiatal hernia was present.      Patchy mild inflammation characterized by congestion (edema) and       erythema was found in the gastric antrum.      A deformity was found at the pylorus. Double pylorus.      One non-bleeding cratered duodenal ulcer with adherent clot was found in       the duodenal bulb. No bleeding induced with washing.      The second portion of the duodenum was normal. Impression:               - Normal hypopharynx.                           - Normal esophagus.                           - Z-line regular, 36 cm from the incisors.                           - 2 cm hiatal  hernia.                           - Gastritis.                           - Double pylorus                           - Large on-bleeding duodenal ulcer with adherent                            clot involving anteromedial wall. Elected not to                            proceed with therapy as risk of inducing serious                            high.                           - Normal second portion of the duodenum.                           - No specimens collected. Moderate Sedation:      Per Anesthesia Care Recommendation:           - Return patient to hospital ward for ongoing care.                           - Full liquid diet today.                           - Continue  present medications.                           - No ASpirin or NSAIDs.                           - Comntinue P{antoprazole infusion for total of 72                            hours.                           - Sucralfate 1 g po qid for 4 weeks.                           - H. Pylori serolgy.                           - Repeat EGD prior to DC and followup exam in 3                            months. Procedure Code(s):        --- Professional ---  37048, Esophagogastroduodenoscopy, flexible,                            transoral; diagnostic, including collection of                            specimen(s) by brushing or washing, when performed                            (separate procedure) Diagnosis Code(s):        --- Professional ---                           K44.9, Diaphragmatic hernia without obstruction or                            gangrene                           K29.70, Gastritis, unspecified, without bleeding                           K31.89, Other diseases of stomach and duodenum                           K26.4, Chronic or unspecified duodenal ulcer with                            hemorrhage                           K92.1, Melena (includes Hematochezia) CPT copyright 2019 American Medical Association. All rights reserved. The codes documented in this report are preliminary and upon coder review may  be revised to meet current compliance requirements. Lionel December, MD Lionel December, MD 04/29/2020 12:07:17 PM This report has been signed electronically. Number of Addenda: 0

## 2020-04-29 NOTE — Anesthesia Preprocedure Evaluation (Addendum)
Anesthesia Evaluation  Patient identified by MRN, date of birth, ID band Patient awake    Reviewed: Allergy & Precautions, NPO status , Patient's Chart, lab work & pertinent test results  History of Anesthesia Complications (+) AWARENESS UNDER ANESTHESIA  Airway Mallampati: II  TM Distance: >3 FB Neck ROM: Full    Dental  (+) Edentulous Upper, Edentulous Lower   Pulmonary former smoker,   Room air o2 89 to 93 Pulmonary exam normal breath sounds clear to auscultation       Cardiovascular +CHF   Rhythm:Regular Rate:Tachycardia   Moderate sized defect consistent with scar and possible soft tissue attenuation (diaphragm, gut activity) No ischemia Extensive soft tissue overlying heart  This is an intermediate risk study.  Nuclear stress EF: 35%.     Neuro/Psych    GI/Hepatic Neg liver ROS, GI bleed   Endo/Other  negative endocrine ROS  Renal/GU negative Renal ROS     Musculoskeletal  (+) Arthritis ,   Abdominal   Peds  Hematology   Anesthesia Other Findings 28-Apr-2020 20:43:39 [facility] ROUTINE RECORD Sinus tachycardia Anterolateral infarct, age indeterminate Confirmed by Raeford Razor [phone]) on 04/28/2020 10:07:39 PM  Reproductive/Obstetrics                            Anesthesia Physical Anesthesia Plan  ASA: IV and emergent  Anesthesia Plan: General   Post-op Pain Management:    Induction: Intravenous  PONV Risk Score and Plan: TIVA  Airway Management Planned: Nasal Cannula, Natural Airway and Simple Face Mask  Additional Equipment:   Intra-op Plan:   Post-operative Plan:   Informed Consent: I have reviewed the patients History and Physical, chart, labs and discussed the procedure including the risks, benefits and alternatives for the proposed anesthesia with the patient or authorized representative who has indicated his/her understanding and  acceptance.     Dental advisory given  Plan Discussed with: CRNA and Surgeon  Anesthesia Plan Comments:        Anesthesia Quick Evaluation

## 2020-04-29 NOTE — Transfer of Care (Signed)
Immediate Anesthesia Transfer of Care Note  Patient: Nicole Welch  Procedure(s) Performed: ESOPHAGOGASTRODUODENOSCOPY (EGD) WITH PROPOFOL (N/A )  Patient Location: PACU  Anesthesia Type:General  Level of Consciousness: awake, alert  and oriented  Airway & Oxygen Therapy: Patient Spontanous Breathing and Patient connected to nasal cannula oxygen  Post-op Assessment: Report given to RN and Post -op Vital signs reviewed and stable  Post vital signs: Reviewed and stable  Last Vitals:  Vitals Value Taken Time  BP 109/36 04/29/20 1156  Temp 97.3   Pulse 87 04/29/20 1158  Resp 26 04/29/20 1158  SpO2 100 % 04/29/20 1158  Vitals shown include unvalidated device data.  Last Pain:  Vitals:   04/29/20 1119  TempSrc:   PainSc: 0-No pain      Patients Stated Pain Goal: 1 (04/29/20 0900)  Complications: No complications documented.

## 2020-04-29 NOTE — Anesthesia Postprocedure Evaluation (Signed)
Anesthesia Post Note  Patient: Nicole Welch  Procedure(s) Performed: ESOPHAGOGASTRODUODENOSCOPY (EGD) WITH PROPOFOL (N/A )  Patient location during evaluation: PACU Anesthesia Type: General Level of consciousness: awake and alert and oriented Pain management: pain level controlled Vital Signs Assessment: post-procedure vital signs reviewed and stable Respiratory status: spontaneous breathing, respiratory function stable and patient connected to nasal cannula oxygen Cardiovascular status: blood pressure returned to baseline and stable Postop Assessment: no apparent nausea or vomiting Anesthetic complications: no   No complications documented.   Last Vitals:  Vitals:   04/29/20 1153 04/29/20 1201  BP: (!) 109/36 (!) 114/44  Pulse: 87 88  Resp: 15 18  Temp: (!) 36.4 C   SpO2: 100% 100%    Last Pain:  Vitals:   04/29/20 1119  TempSrc:   PainSc: 0-No pain                 Psalm Arman C Tiyah Zelenak

## 2020-04-29 NOTE — Progress Notes (Addendum)
PROGRESS NOTE   Nicole Welch  QZR:[id number] DOB: 1956-05-17 DOA: 04/28/2020 PCP: Elfredia Nevins, MD   Chief Complaint  Patient presents with  . Weakness    Brief Admission History:  64 y.o. female, medical history of anxiety, chronic back pain, osteoporosis, chronic diastolic CHF, [facility] to ED secondary to complaints of generalized weakness and melena, patient reports overall generalized weakness and fatigue for last 3 days, she does report an episode of melena started yesterday, another episode this morning, she does report nausea and vomiting earlier today, reports is nonbilious(she did vomit purple gummy bears she ate earlier today), patient reports she takes Mobic occasionally, she last dose was before 2 days, she is on aspirin 81 mg oral daily chronically.  She was admitted with melena and suspected upper GI bleeding.   Assessment & Plan:   Active Problems:   Anxiety   Acute diastolic CHF (congestive heart failure) (HCC)   GI bleed   1. Acute upper GI bleeding - likely from NSAIDS.  Appreciate GI consultation. EGD planned today.  Follow up results. Continue IV protonix infusion.  HOLD NSAIDS.  **EGD with findings of large bulbar ulceration, requiring IV pantoprazole, change to full inpatient status.   2. Chronic diastolic CHF -  Lasix was temporarily on hold, resume 8/9.  3. GAD - resume home meds 4. Leukocytosis - WBC trending down, recheck in AM.  5. Chronic blood loss anemia - EGD today. Likely needs iron supplement.    DVT prophylaxis:  SCDS Code Status:  Full  Family Communication: daughter at bedside Disposition: home   Status is: Observation  The patient will require care spanning > 2 midnights and should be moved to inpatient because: IV treatments appropriate due to intensity of illness or inability to take PO and Inpatient level of care appropriate due to severity of illness  Dispo: The patient is from: Home              Anticipated d/c is to: Home               Anticipated d/c date is: 2 days              Patient currently is not medically stable to d/c.   Consultants:   GI   Procedures:   EGD 04/29/20  Antimicrobials:    Subjective: Pt says she is hungry today.    Objective: Vitals:   04/29/20 1153 04/29/20 1201 04/29/20 1215 04/29/20 1232  BP: (!) 109/36 (!) 114/44 (!) 106/50 119/65  Pulse: 87 88 88 86  Resp: 15 18 16 19   Temp: (!) 97.5 F (36.4 C)   98 F (36.7 C)  TempSrc:    Oral  SpO2: 100% 100% 99% 99%  Weight:      Height:        Intake/Output Summary (Last 24 hours) at 04/29/2020 1235 Last data filed at 04/29/2020 1215 Gross per 24 hour  Intake 1114.88 ml  Output --  Net 1114.88 ml   Filed Weights   04/28/20 1934 04/28/20 2350  Weight: 77.1 kg 77 kg    Examination:  General exam: Appears calm and comfortable  Respiratory system: Clear to auscultation. Respiratory effort normal. Cardiovascular system: S1 & S2 heard, RRR. No JVD, murmurs, rubs, gallops or clicks. No pedal edema. Gastrointestinal system: Abdomen is nondistended, soft and nontender. No organomegaly or masses felt. Normal bowel sounds heard. Central nervous system: Alert and oriented. No focal neurological deficits. Extremities: Symmetric 5 x 5 power. Skin: No  rashes, lesions or ulcers Psychiatry: Judgement and insight appear normal. Mood & affect appropriate.   Data Reviewed: I have personally reviewed following labs and imaging studies  CBC: Recent Labs  Lab 04/28/20 2035 04/29/20 0706  WBC 20.3* 17.7*  NEUTROABS 18.3*  --   HGB 12.9 10.5*  HCT 39.9 33.3*  MCV 99.8 101.2*  PLT 532* 469*    Basic Metabolic Panel: Recent Labs  Lab 04/28/20 2035 04/29/20 0706  NA 135 141  K 5.0 4.8  CL 98 101  CO2 26 30  GLUCOSE 186* 101*  BUN 32* 43*  CREATININE 1.08* 0.83  CALCIUM 8.6* 8.5*    GFR: Estimated Creatinine Clearance: 67.2 mL/min (by C-G formula based on SCr of 0.83 mg/dL).  Liver Function Tests: Recent Labs  Lab  04/28/20 2035  AST 15  ALT 11  ALKPHOS 86  BILITOT 0.5  PROT 7.7  ALBUMIN 3.0*    CBG: No results for input(s): GLUCAP in the last 168 hours.  Recent Results (from the past 240 hour(s))  SARS Coronavirus 2 by RT PCR (hospital order, performed in [facility] hospital lab) Nasopharyngeal Nasopharyngeal Swab     Status: None   Collection Time: 04/28/20  8:20 PM   Specimen: Nasopharyngeal Swab  Result Value Ref Range Status   SARS Coronavirus 2 NEGATIVE NEGATIVE Final    Comment: (NOTE) SARS-CoV-2 target nucleic acids are NOT DETECTED.  The SARS-CoV-2 RNA is generally detectable in upper and lower respiratory specimens during the acute phase of infection. The lowest concentration of SARS-CoV-2 viral copies this assay can detect is 250 copies / mL. A negative result does not preclude SARS-CoV-2 infection and should not be used as the sole basis for treatment or other patient management decisions.  A negative result may occur with improper specimen collection / handling, submission of specimen other than nasopharyngeal swab, presence of viral mutation(s) within the areas targeted by this assay, and inadequate number of viral copies (<250 copies / mL). A negative result must be combined with clinical observations, patient history, and epidemiological information.  Fact Sheet for Patients:   BoilerBrush.com.cy  Fact Sheet for Healthcare Providers: https://pope.com/  This test is not yet approved or  cleared by the [place] FDA and has been authorized for detection and/or diagnosis of SARS-CoV-2 by FDA under an Emergency Use Authorization (EUA).  This EUA will remain in effect (meaning this test can be used) for the duration of the COVID-19 declaration under Section 564(b)(1) of the Act, 21 U.S.C. section 360bbb-3(b)(1), unless the authorization is terminated or revoked sooner.  Performed at [facility], [address],  [place], [place] [address]      Radiology Studies: DG Chest Portable 1 View  Result Date: 04/28/2020 CLINICAL DATA:  Hypoxemia, generalized weakness, nausea and vomiting EXAM: PORTABLE CHEST 1 VIEW COMPARISON:  07/19/2018 FINDINGS: Heart size and pulmonary vascularity are normal. Slight fibrosis or atelectasis in the left lung base. No consolidation or airspace disease. No pleural effusions. No pneumothorax. Mediastinal contours appear intact. IMPRESSION: Slight fibrosis or atelectasis in the left lung base. No evidence of active pulmonary disease. Electronically Signed   By: Burman Nieves M.D.   On: 04/28/2020 21:41   Scheduled Meds: . calcium-vitamin D  1 tablet Oral Daily  . nicotine  7 mg Transdermal Daily  . [START ON 05/02/2020] pantoprazole  40 mg Intravenous Q12H  . potassium chloride  30 mEq Oral Daily  . simvastatin  40 mg Oral QHS  . sucralfate  1  g Oral TID WC & HS   Continuous Infusions: . sodium chloride 50 mL/hr at 04/29/20 0004  . pantoprozole (PROTONIX) infusion 8 mg/hr (04/29/20 0931)     LOS: 0 days   Time spent: 17 minutes   Amariya Liskey Laural Benes, MD How to contact the [facility] Attending or Consulting provider 7A - 7P or covering provider during after hours 7P -7A, for this patient?  1. Check the care team in [facility] and look for a) attending/consulting TRH provider listed and b) the [facility] team listed 2. Log into [email or web address] and use [place] universal password to access. If you do not have the password, please contact the hospital operator. 3. Locate the [facility] provider you are looking for under [facility] and page to a number that you can be directly reached. 4. If you still have difficulty reaching the provider, please page the [facility] (Director on Call) for the Hospitalists listed on amion for assistance.  04/29/2020, 12:35 PM

## 2020-04-29 NOTE — Progress Notes (Signed)
   04/29/20 0900  Assess: MEWS Score  Temp 98.3 F (36.8 C)  BP (!) 96/50  Pulse Rate (!) 102  SpO2 94 %  O2 Device Room Air  Assess: MEWS Score  MEWS Temp 0  MEWS Systolic 1  MEWS Pulse 1  MEWS RR 0  MEWS LOC 0  MEWS Score 2  MEWS Score Color Yellow  Assess: if the MEWS score is Yellow or Red  Were vital signs taken at a resting state? Yes  Focused Assessment No change from prior assessment  Early Detection of Sepsis Score *See Row Information* Low  MEWS guidelines implemented *See Row Information* Yes  Treat  MEWS Interventions Administered prn meds/treatments  Pain Scale 0-10  Pain Score 8  Pain Type Chronic pain  Pain Location Back  Pain Orientation Lower  Pain Descriptors / Indicators Aching  Pain Frequency Constant  Pain Onset On-going  Patients Stated Pain Goal 1  Pain Intervention(s) Medication (See eMAR)  Multiple Pain Sites No  Complains of Anxiety  Neuro symptoms relieved by Anti-anxiety medication  Patients response to intervention Decreased  Take Vital Signs  Increase Vital Sign Frequency  Yellow: Q 2hr X 2 then Q 4hr X 2, if remains yellow, continue Q 4hrs  Escalate  MEWS: Escalate Yellow: discuss with charge nurse/RN and consider discussing with provider and RRT  Notify: Charge Nurse/RN  Name of Charge Nurse/RN Notified Tim Goins  Date Charge Nurse/RN Notified 04/29/20  Time Charge Nurse/RN Notified [phone]

## 2020-04-29 NOTE — Progress Notes (Signed)
Brief EGD note.  Normal mucosa of the esophagus 2 cm sliding hiatal hernia. Antral erythema and edema without erosions or ulceration. Large bulbar ulcer covered with clot no active bleeding.  Clot was not washed away on washing.  Therefore decided not to proceed with therapeutic intervention. Post bulbar mucosa normal.

## 2020-04-30 DIAGNOSIS — E538 Deficiency of other specified B group vitamins: Secondary | ICD-10-CM | POA: Diagnosis present

## 2020-04-30 DIAGNOSIS — K922 Gastrointestinal hemorrhage, unspecified: Secondary | ICD-10-CM | POA: Diagnosis not present

## 2020-04-30 DIAGNOSIS — K259 Gastric ulcer, unspecified as acute or chronic, without hemorrhage or perforation: Secondary | ICD-10-CM | POA: Diagnosis present

## 2020-04-30 DIAGNOSIS — F419 Anxiety disorder, unspecified: Secondary | ICD-10-CM | POA: Diagnosis not present

## 2020-04-30 DIAGNOSIS — I959 Hypotension, unspecified: Secondary | ICD-10-CM | POA: Diagnosis not present

## 2020-04-30 DIAGNOSIS — I5031 Acute diastolic (congestive) heart failure: Secondary | ICD-10-CM | POA: Diagnosis not present

## 2020-04-30 LAB — CBC
HCT: 29.2 % — ABNORMAL LOW (ref 36.0–46.0)
Hemoglobin: 9.1 g/dL — ABNORMAL LOW (ref 12.0–15.0)
MCH: 32.3 pg (ref 26.0–34.0)
MCHC: 31.2 g/dL (ref 30.0–36.0)
MCV: 103.5 fL — ABNORMAL HIGH (ref 80.0–100.0)
Platelets: 431 10*3/uL — ABNORMAL HIGH (ref 150–400)
RBC: 2.82 MIL/uL — ABNORMAL LOW (ref 3.87–5.11)
RDW: 13 % (ref 11.5–15.5)
WBC: 12.2 10*3/uL — ABNORMAL HIGH (ref 4.0–10.5)
nRBC: 0 % (ref 0.0–0.2)

## 2020-04-30 LAB — URINALYSIS, ROUTINE W REFLEX MICROSCOPIC
Bilirubin Urine: NEGATIVE
Glucose, UA: NEGATIVE mg/dL
Hgb urine dipstick: NEGATIVE
Ketones, ur: NEGATIVE mg/dL
Leukocytes,Ua: NEGATIVE
Nitrite: NEGATIVE
Protein, ur: NEGATIVE mg/dL
Specific Gravity, Urine: 1.006 (ref 1.005–1.030)
pH: 6 (ref 5.0–8.0)

## 2020-04-30 LAB — BASIC METABOLIC PANEL
Anion gap: 8 (ref 5–15)
BUN: 16 mg/dL (ref 8–23)
CO2: 27 mmol/L (ref 22–32)
Calcium: 8.6 mg/dL — ABNORMAL LOW (ref 8.9–10.3)
Chloride: 106 mmol/L (ref 98–111)
Creatinine, Ser: 0.67 mg/dL (ref 0.44–1.00)
GFR calc Af Amer: >60 mL/min (ref >60)
GFR calc non Af Amer: >60 mL/min (ref >60)
Glucose, Bld: 96 mg/dL (ref 70–99)
Potassium: 4.4 mmol/L (ref 3.5–5.1)
Sodium: 141 mmol/L (ref 135–145)

## 2020-04-30 LAB — URINE CULTURE: Culture: <10000 — AB

## 2020-04-30 LAB — MAGNESIUM: Magnesium: 2.1 mg/dL (ref 1.7–2.4)

## 2020-04-30 MED ORDER — CYANOCOBALAMIN 1000 MCG/ML IJ SOLN
1000.0000 ug | Freq: Once | INTRAMUSCULAR | Status: AC
  Administered 2020-04-30: 1000 ug via INTRAMUSCULAR
  Filled 2020-04-30: qty 1

## 2020-04-30 MED ORDER — FUROSEMIDE 20 MG PO TABS
20.0000 mg | ORAL_TABLET | Freq: Every day | ORAL | Status: DC
  Administered 2020-04-30: 20 mg via ORAL
  Filled 2020-04-30: qty 1

## 2020-04-30 MED ORDER — VITAMIN B-12 1000 MCG PO TABS
1000.0000 ug | ORAL_TABLET | Freq: Every day | ORAL | Status: DC
  Administered 2020-05-02 – 2020-05-05 (×4): 1000 ug via ORAL
  Filled 2020-04-30 (×5): qty 1

## 2020-04-30 NOTE — Progress Notes (Addendum)
Subjective:  Patient has no complaints.  She denies nausea vomiting or abdominal pain.  She says she has had 2 bowel movements since endoscopy yesterday morning.  Stools dark but not black.  Her appetite is coming back.  Current Medications:  Current Facility-Administered Medications:  .  0.9 %  sodium chloride infusion, , Intravenous, Continuous, Johnson, Clanford L, MD, Last Rate: 10 mL/hr at 04/30/20 0819, Rate Change at 04/30/20 0819 .  acetaminophen (TYLENOL) tablet 650 mg, 650 mg, Oral, Q6H PRN **OR** acetaminophen (TYLENOL) suppository 650 mg, 650 mg, Rectal, Q6H PRN, Elgergawy, Silver Huguenin, MD .  ALPRAZolam Duanne Moron) tablet 0.5 mg, 0.5 mg, Oral, TID PRN, Elgergawy, Silver Huguenin, MD, 0.5 mg at 04/30/20 0818 .  calcium-vitamin D (OSCAL WITH D) 500-200 MG-UNIT per tablet 1 tablet, 1 tablet, Oral, Daily, Elgergawy, Silver Huguenin, MD, 1 tablet at 04/30/20 0818 .  cyclobenzaprine (FLEXERIL) tablet 10 mg, 10 mg, Oral, BID PRN, Elgergawy, Silver Huguenin, MD .  Derrill Memo ON 05/01/2020] furosemide (LASIX) tablet 20 mg, 20 mg, Oral, Daily, Johnson, Clanford L, MD, 20 mg at 04/30/20 0817 .  nicotine (NICODERM CQ - dosed in mg/24 hr) patch 7 mg, 7 mg, Transdermal, Daily, Elgergawy, Silver Huguenin, MD, 7 mg at 04/30/20 0820 .  ondansetron (ZOFRAN) tablet 4 mg, 4 mg, Oral, Q6H PRN **OR** ondansetron (ZOFRAN) injection 4 mg, 4 mg, Intravenous, Q6H PRN, Elgergawy, Silver Huguenin, MD .  oxyCODONE-acetaminophen (PERCOCET/ROXICET) 5-325 MG per tablet 1 tablet, 1 tablet, Oral, Q6H PRN, Elgergawy, Silver Huguenin, MD, 1 tablet at 04/30/20 0818 .  pantoprazole (PROTONIX) 80 mg in sodium chloride 0.9 % 100 mL (0.8 mg/mL) infusion, 8 mg/hr, Intravenous, Continuous, Elgergawy, Silver Huguenin, MD, Last Rate: 10 mL/hr at 04/30/20 0817, 8 mg/hr at 04/30/20 0817 .  [START ON 05/02/2020] pantoprazole (PROTONIX) injection 40 mg, 40 mg, Intravenous, Q12H, Elgergawy, Dawood S, MD .  potassium chloride (KLOR-CON) packet 30 mEq, 30 mEq, Oral, Daily, Elgergawy, Silver Huguenin,  MD, 30 mEq at 04/30/20 0818 .  simvastatin (ZOCOR) tablet 40 mg, 40 mg, Oral, QHS, Elgergawy, Silver Huguenin, MD, 40 mg at 04/29/20 2101 .  sucralfate (CARAFATE) 1 GM/10ML suspension 1 g, 1 g, Oral, TID WC & HS, Dabid Godown U, MD, 1 g at 04/30/20 0819  Objective: Blood pressure (!) 95/50, pulse 72, temperature 98 F (36.7 C), resp. rate 18, height 5' 3"  (1.6 m), weight 77 kg, SpO2 94 %. Patient is alert and appears to be comfortable. Systolic murmur is unchanged.Marland Kitchen  Best heard at aortic area.  It is somewhat high-pitched. Auscultation lungs reveal vesicular breath sounds bilaterally.  No rales or rhonchi. Abdomen is soft and nontender with organomegaly or masses.   Labs/studies Results:  CBC Latest Ref Rng & Units 04/30/2020 04/29/2020 04/28/2020  WBC 4.0 - 10.5 K/uL 12.2(H) 17.7(H) 20.3(H)  Hemoglobin 12.0 - 15.0 g/dL 9.1(L) 10.5(L) 12.9  Hematocrit 36 - 46 % 29.2(L) 33.3(L) 39.9  Platelets 150 - 400 K/uL 431(H) 469(H) 532(H)    CMP Latest Ref Rng & Units 04/30/2020 04/29/2020 04/28/2020  Glucose 70 - 99 mg/dL 96 101(H) 186(H)  BUN 8 - 23 mg/dL 16 43(H) 32(H)  Creatinine 0.44 - 1.00 mg/dL 0.67 0.83 1.08(H)  Sodium 135 - 145 mmol/L 141 141 135  Potassium 3.5 - 5.1 mmol/L 4.4 4.8 5.0  Chloride 98 - 111 mmol/L 106 101 98  CO2 22 - 32 mmol/L 27 30 26   Calcium 8.9 - 10.3 mg/dL 8.6(L) 8.5(L) 8.6(L)  Total Protein 6.5 - 8.1 g/dL - - 7.7  Total Bilirubin 0.3 - 1.2 mg/dL - - 0.5  Alkaline Phos 38 - 126 U/L - - 86  AST 15 - 41 U/L - - 15  ALT 0 - 44 U/L - - 11    Hepatic Function Latest Ref Rng & Units 04/28/2020 07/15/2018 07/14/2018  Total Protein 6.5 - 8.1 g/dL 7.7 7.2 6.7  Albumin 3.5 - 5.0 g/dL 3.0(L) 3.2(L) 3.0(L)  AST 15 - 41 U/L 15 32 40  ALT 0 - 44 U/L 11 28 32  Alk Phosphatase 38 - 126 U/L 86 115 116  Total Bilirubin 0.3 - 1.2 mg/dL 0.5 1.4(H) 1.4(H)  Bilirubin, Direct 0.0 - 0.3 mg/dL - - -    H. pylori serology pending.  Serum iron 29 TIBC 247 and saturation 12%. Serum ferritin  65.  Serum B12 101 pg/mL  Urine culture pending.  Assessment:  #1.  Upper GI bleed secondary to large bulbar ulcer with adherent clot but no bleeding.  Approach to this ulcer was difficult because of location.  Therefore I felt the safest option is not to attempt intervention.  She is doing well.  H. pylori serology is pending but etiology is felt to be NSAID use on top of low-dose aspirin.  BUN is down to normal.  No evidence of recurrent bleed.  #2.  Anemia secondary to upper GI bleed.  Hemoglobin has dropped since yesterday.  Suspect this is due to equilibrium.  Iron studies more consistent with chronic disease but she will be treated with p.o. iron.  #3.  History of CHF.  Patient appears to be in compensated state.  #4.  B12 deficiency.  Does not appear to be the cause of her anemia but she needs to be treated.  Recommendations  Continue IV pantoprazole infusion for the 24 hours. Advance diet starting this evening. CBC in a.m. B12 therapy per Dr. Wynetta Emery.

## 2020-04-30 NOTE — Progress Notes (Addendum)
PROGRESS NOTE   Nicole Welch  XNA:[id number] DOB: 1955/12/05 DOA: 04/28/2020 PCP: Elfredia Nevins, MD   Chief Complaint  Patient presents with  . Weakness    Brief Admission History:  64 y.o. female, medical history of anxiety, chronic back pain, osteoporosis, chronic diastolic CHF, [facility] to ED secondary to complaints of generalized weakness and melena, patient reports overall generalized weakness and fatigue for last 3 days, she does report an episode of melena started yesterday, another episode this morning, she does report nausea and vomiting earlier today, reports is nonbilious(she did vomit purple gummy bears she ate earlier today), patient reports she takes Mobic occasionally, she last dose was before 2 days, she is on aspirin 81 mg oral daily chronically.  She was admitted with melena and suspected upper GI bleeding.   Assessment & Plan:   Active Problems:   Anxiety   Acute diastolic CHF (congestive heart failure) (HCC)   GI bleed   Upper GI bleeding   1. Acute upper GI bleeding - likely from NSAIDS.  Appreciate GI consultation. HOLD NSAIDS.  EGD with findings of large bulbar ulceration, requiring IV pantoprazole, change to full inpatient status.  H pylori serology pending.   2. Chronic diastolic CHF -  Lasix was temporarily on hold, resume 8/9.  3. GAD - resume home meds 4. Leukocytosis - WBC trending down, recheck in AM.  5. Chronic blood loss anemia - EGD with findings of large bulbar ulcer. 6. Vitamin B12 deficiency - IM replacement 8/9, start oral replacement 8/10.   7. Hypotension - hold lasix today.    DVT prophylaxis:  SCDS Code Status:  Full  Family Communication: daughter at bedside 8/8 Disposition: home   Status is: INPATIENT  The patient will require care spanning > 2 midnights and should be moved to inpatient because: IV treatments appropriate due to intensity of illness or inability to take PO and Inpatient level of care appropriate due to severity of  illness  Pt remains on IV protonix infusion for large bleeding bulbar ulcer.   Dispo: The patient is from: Home              Anticipated d/c is to: Home              Anticipated d/c date is: 2 days              Patient currently is not medically stable to d/c.   Consultants:   GI   Procedures:   EGD 04/29/20  Antimicrobials:    Subjective: Pt not sleeping well in hospital.    Objective: Vitals:   04/29/20 2024 04/30/20 0038 04/30/20 0449 04/30/20 0800  BP: (!) 105/49 (!) 91/33 (!) 98/43 (!) 95/50  Pulse: 94 85 81 72  Resp: 20  18   Temp: 98.5 F (36.9 C) 98.4 F (36.9 C) 98 F (36.7 C)   TempSrc:      SpO2: 97% 98% 99% 94%  Weight:      Height:        Intake/Output Summary (Last 24 hours) at 04/30/2020 1333 Last data filed at 04/30/2020 0700 Gross per 24 hour  Intake 1493.83 ml  Output --  Net 1493.83 ml   Filed Weights   04/28/20 1934 04/28/20 2350  Weight: 77.1 kg 77 kg    Examination:  General exam: Appears calm and comfortable  Respiratory system: Clear to auscultation. Respiratory effort normal. Cardiovascular system: S1 & S2 heard, RRR. No JVD, murmurs, rubs, gallops or clicks. No pedal  edema. Gastrointestinal system: Abdomen is nondistended, soft and nontender. No organomegaly or masses felt. Normal bowel sounds heard. Central nervous system: Alert and oriented. No focal neurological deficits. Extremities: Symmetric 5 x 5 power. Skin: No rashes, lesions or ulcers Psychiatry: Judgement and insight appear normal. Mood & affect appropriate.   Data Reviewed: I have personally reviewed following labs and imaging studies  CBC: Recent Labs  Lab 04/28/20 2035 04/29/20 0706 04/30/20 0507  WBC 20.3* 17.7* 12.2*  NEUTROABS 18.3*  --   --   HGB 12.9 10.5* 9.1*  HCT 39.9 33.3* 29.2*  MCV 99.8 101.2* 103.5*  PLT 532* 469* 431*    Basic Metabolic Panel: Recent Labs  Lab 04/28/20 2035 04/29/20 0706 04/30/20 0507  NA 135 141 141  K 5.0 4.8 4.4  CL  98 101 106  CO2 26 30 27   GLUCOSE 186* 101* 96  BUN 32* 43* 16  CREATININE 1.08* 0.83 0.67  CALCIUM 8.6* 8.5* 8.6*  MG  --   --  2.1    GFR: Estimated Creatinine Clearance: 69.8 mL/min (by C-G formula based on SCr of 0.67 mg/dL).  Liver Function Tests: Recent Labs  Lab 04/28/20 2035  AST 15  ALT 11  ALKPHOS 86  BILITOT 0.5  PROT 7.7  ALBUMIN 3.0*    CBG: No results for input(s): GLUCAP in the last 168 hours.  Recent Results (from the past 240 hour(s))  SARS Coronavirus 2 by RT PCR (hospital order, performed in [facility] hospital lab) Nasopharyngeal Nasopharyngeal Swab     Status: None   Collection Time: 04/28/20  8:20 PM   Specimen: Nasopharyngeal Swab  Result Value Ref Range Status   SARS Coronavirus 2 NEGATIVE NEGATIVE Final    Comment: (NOTE) SARS-CoV-2 target nucleic acids are NOT DETECTED.  The SARS-CoV-2 RNA is generally detectable in upper and lower respiratory specimens during the acute phase of infection. The lowest concentration of SARS-CoV-2 viral copies this assay can detect is 250 copies / mL. A negative result does not preclude SARS-CoV-2 infection and should not be used as the sole basis for treatment or other patient management decisions.  A negative result may occur with improper specimen collection / handling, submission of specimen other than nasopharyngeal swab, presence of viral mutation(s) within the areas targeted by this assay, and inadequate number of viral copies (<250 copies / mL). A negative result must be combined with clinical observations, patient history, and epidemiological information.  Fact Sheet for Patients:   06/28/20  Fact Sheet for Healthcare Providers: BoilerBrush.com.cy  This test is not yet approved or  cleared by the https://pope.com/ FDA and has been authorized for detection and/or diagnosis of SARS-CoV-2 by FDA under an Emergency Use Authorization (EUA).  This  EUA will remain in effect (meaning this test can be used) for the duration of the COVID-19 declaration under Section 564(b)(1) of the Act, 21 U.S.C. section 360bbb-3(b)(1), unless the authorization is terminated or revoked sooner.  Performed at [facility], [address], [place], [place]   Urine culture     Status: Abnormal   Collection Time: 04/28/20  9:31 PM   Specimen: Urine, Clean Catch  Result Value Ref Range Status   Specimen Description   Final    URINE, CLEAN CATCH Performed at [facility], [address], [place], [place]    Special Requests   Final    NONE Performed at [facility], [address] [place], [place]    Culture (A)  Final    <10,000 COLONIES/mL INSIGNIFICANT GROWTH Performed at [facility], [address] [place], [place] [address]    Report Status 04/30/2020 FINAL  Final     Radiology Studies: DG Chest Portable 1 View  Result Date: 04/28/2020 CLINICAL DATA:  Hypoxemia, generalized weakness, nausea and vomiting EXAM: PORTABLE CHEST 1 VIEW COMPARISON:  07/19/2018 FINDINGS: Heart size and pulmonary vascularity are normal. Slight fibrosis or atelectasis in the left lung base. No consolidation or airspace disease. No pleural effusions. No pneumothorax. Mediastinal contours appear intact. IMPRESSION: Slight fibrosis or atelectasis in the left lung base. No evidence of active pulmonary disease. Electronically Signed   By: Burman Nieves M.D.   On: 04/28/2020 21:41   Scheduled Meds: . calcium-vitamin D  1 tablet Oral Daily  . [START ON 05/01/2020] furosemide  20 mg Oral Daily  . nicotine  7 mg Transdermal Daily  . [START ON 05/02/2020] pantoprazole  40 mg Intravenous Q12H  . potassium chloride  30 mEq Oral Daily  . simvastatin  40 mg Oral QHS  . sucralfate  1 g Oral TID WC & HS   Continuous Infusions: . sodium chloride 10 mL/hr at 04/30/20 0819  . pantoprozole (PROTONIX) infusion 8 mg/hr (04/30/20 0817)     LOS: 1  day   Time spent: 15 minutes   Nellene Courtois Laural Benes, MD How to contact the [facility] Attending or Consulting provider 7A - 7P or covering provider during after hours 7P -7A, for this patient?  1. Check the care team in [facility] and look for a) attending/consulting TRH provider listed and b) the [facility] team listed 2. Log into [email or web address] and use [place]'s universal password to access. If you do not have the password, please contact the hospital operator. 3. Locate the [facility] provider you are looking for under [facility] and page to a number that you can be directly reached. 4. If you still have difficulty reaching the provider, please page the [facility] (Director on Call) for the Hospitalists listed on amion for assistance.  04/30/2020, 1:33 PM

## 2020-05-01 ENCOUNTER — Encounter (HOSPITAL_COMMUNITY)
Admission: EM | Disposition: A | Discharge status: Home / Self Care | Payer: Self-pay | Source: Home / Self Care | Attending: Internal Medicine

## 2020-05-01 ENCOUNTER — Inpatient Hospital Stay (HOSPITAL_COMMUNITY): Payer: Medicare HMO | Admitting: Anesthesiology

## 2020-05-01 ENCOUNTER — Encounter (HOSPITAL_COMMUNITY): Payer: Self-pay | Admitting: Internal Medicine

## 2020-05-01 ENCOUNTER — Inpatient Hospital Stay (HOSPITAL_COMMUNITY): Payer: Medicare HMO | Admitting: Certified Registered Nurse Anesthetist

## 2020-05-01 ENCOUNTER — Other Ambulatory Visit (HOSPITAL_COMMUNITY): Payer: Medicare HMO

## 2020-05-01 ENCOUNTER — Ambulatory Visit (HOSPITAL_COMMUNITY)
Admit: 2020-05-01 | Discharge: 2020-05-01 | Disposition: A | Discharge status: Home / Self Care | Payer: Medicare HMO | Attending: Family Medicine | Admitting: Family Medicine

## 2020-05-01 DIAGNOSIS — K3189 Other diseases of stomach and duodenum: Secondary | ICD-10-CM

## 2020-05-01 DIAGNOSIS — I959 Hypotension, unspecified: Secondary | ICD-10-CM | POA: Diagnosis not present

## 2020-05-01 DIAGNOSIS — E538 Deficiency of other specified B group vitamins: Secondary | ICD-10-CM

## 2020-05-01 DIAGNOSIS — K921 Melena: Secondary | ICD-10-CM

## 2020-05-01 DIAGNOSIS — K922 Gastrointestinal hemorrhage, unspecified: Secondary | ICD-10-CM | POA: Insufficient documentation

## 2020-05-01 DIAGNOSIS — I5031 Acute diastolic (congestive) heart failure: Secondary | ICD-10-CM | POA: Diagnosis not present

## 2020-05-01 DIAGNOSIS — K264 Chronic or unspecified duodenal ulcer with hemorrhage: Secondary | ICD-10-CM

## 2020-05-01 DIAGNOSIS — K2970 Gastritis, unspecified, without bleeding: Secondary | ICD-10-CM

## 2020-05-01 HISTORY — PX: IR ANGIOGRAM SELECTIVE EACH ADDITIONAL VESSEL: IMG667

## 2020-05-01 HISTORY — PX: IR ANGIOGRAM VISCERAL SELECTIVE: IMG657

## 2020-05-01 HISTORY — PX: RADIOLOGY WITH ANESTHESIA: SHX6223

## 2020-05-01 HISTORY — PX: IR US GUIDE VASC ACCESS RIGHT: IMG2390

## 2020-05-01 HISTORY — PX: ESOPHAGOGASTRODUODENOSCOPY (EGD) WITH PROPOFOL: SHX5813

## 2020-05-01 LAB — BASIC METABOLIC PANEL
Anion gap: 10 (ref 5–15)
BUN: 8 mg/dL (ref 8–23)
CO2: 25 mmol/L (ref 22–32)
Calcium: 8.4 mg/dL — ABNORMAL LOW (ref 8.9–10.3)
Chloride: 106 mmol/L (ref 98–111)
Creatinine, Ser: 0.63 mg/dL (ref 0.44–1.00)
GFR calc Af Amer: >60 mL/min (ref >60)
GFR calc non Af Amer: >60 mL/min (ref >60)
Glucose, Bld: 99 mg/dL (ref 70–99)
Potassium: 3.3 mmol/L — ABNORMAL LOW (ref 3.5–5.1)
Sodium: 141 mmol/L (ref 135–145)

## 2020-05-01 LAB — CBC
HCT: 28.8 % — ABNORMAL LOW (ref 36.0–46.0)
HCT: 25.2 % — ABNORMAL LOW (ref 36.0–46.0)
Hemoglobin: 8.9 g/dL — ABNORMAL LOW (ref 12.0–15.0)
Hemoglobin: 8.1 g/dL — ABNORMAL LOW (ref 12.0–15.0)
MCH: 31.8 pg (ref 26.0–34.0)
MCH: 32.8 pg (ref 26.0–34.0)
MCHC: 30.9 g/dL (ref 30.0–36.0)
MCHC: 32.1 g/dL (ref 30.0–36.0)
MCV: 102.9 fL — ABNORMAL HIGH (ref 80.0–100.0)
MCV: 102 fL — ABNORMAL HIGH (ref 80.0–100.0)
Platelets: 461 10*3/uL — ABNORMAL HIGH (ref 150–400)
Platelets: 421 10*3/uL — ABNORMAL HIGH (ref 150–400)
RBC: 2.8 MIL/uL — ABNORMAL LOW (ref 3.87–5.11)
RBC: 2.47 MIL/uL — ABNORMAL LOW (ref 3.87–5.11)
RDW: 12.8 % (ref 11.5–15.5)
RDW: 13 % (ref 11.5–15.5)
WBC: 9.2 10*3/uL (ref 4.0–10.5)
WBC: 10.5 10*3/uL (ref 4.0–10.5)
nRBC: 0 % (ref 0.0–0.2)
nRBC: 0 % (ref 0.0–0.2)

## 2020-05-01 LAB — H. PYLORI ANTIBODY, IGG: H Pylori IgG: 1.17 Index Value — ABNORMAL HIGH (ref 0.00–0.79)

## 2020-05-01 LAB — POCT I-STAT 7, (LYTES, BLD GAS, ICA,H+H)
Acid-base deficit: 4 mmol/L — ABNORMAL HIGH (ref 0.0–2.0)
Acid-base deficit: 4 mmol/L — ABNORMAL HIGH (ref 0.0–2.0)
Bicarbonate: 22.3 mmol/L (ref 20.0–28.0)
Bicarbonate: 22 mmol/L (ref 20.0–28.0)
Calcium, Ion: 1.03 mmol/L — ABNORMAL LOW (ref 1.15–1.40)
Calcium, Ion: 1.02 mmol/L — ABNORMAL LOW (ref 1.15–1.40)
HCT: 32 % — ABNORMAL LOW (ref 36.0–46.0)
HCT: 29 % — ABNORMAL LOW (ref 36.0–46.0)
Hemoglobin: 10.9 g/dL — ABNORMAL LOW (ref 12.0–15.0)
Hemoglobin: 9.9 g/dL — ABNORMAL LOW (ref 12.0–15.0)
O2 Saturation: 100 %
O2 Saturation: 100 %
Patient temperature: 36.9
Patient temperature: 37
Potassium: 4.6 mmol/L (ref 3.5–5.1)
Potassium: 4.4 mmol/L (ref 3.5–5.1)
Sodium: 143 mmol/L (ref 135–145)
Sodium: 143 mmol/L (ref 135–145)
TCO2: 24 mmol/L (ref 22–32)
TCO2: 23 mmol/L (ref 22–32)
pCO2 arterial: 45.8 mmHg (ref 32.0–48.0)
pCO2 arterial: 42 mmHg (ref 32.0–48.0)
pH, Arterial: 7.295 — ABNORMAL LOW (ref 7.350–7.450)
pH, Arterial: 7.327 — ABNORMAL LOW (ref 7.350–7.450)
pO2, Arterial: 216 mmHg — ABNORMAL HIGH (ref 83.0–108.0)
pO2, Arterial: 232 mmHg — ABNORMAL HIGH (ref 83.0–108.0)

## 2020-05-01 LAB — POCT I-STAT, CHEM 8
BUN: 10 mg/dL (ref 8–23)
Calcium, Ion: 1.06 mmol/L — ABNORMAL LOW (ref 1.15–1.40)
Chloride: 104 mmol/L (ref 98–111)
Creatinine, Ser: 0.5 mg/dL (ref 0.44–1.00)
Glucose, Bld: 135 mg/dL — ABNORMAL HIGH (ref 70–99)
HCT: 21 % — ABNORMAL LOW (ref 36.0–46.0)
Hemoglobin: 7.1 g/dL — ABNORMAL LOW (ref 12.0–15.0)
Potassium: 4.5 mmol/L (ref 3.5–5.1)
Sodium: 141 mmol/L (ref 135–145)
TCO2: 22 mmol/L (ref 22–32)

## 2020-05-01 LAB — MAGNESIUM: Magnesium: 1.9 mg/dL (ref 1.7–2.4)

## 2020-05-01 LAB — PREPARE RBC (CROSSMATCH)

## 2020-05-01 LAB — ABO/RH: ABO/RH(D): A POS

## 2020-05-01 SURGERY — IR WITH ANESTHESIA
Anesthesia: General

## 2020-05-01 SURGERY — ESOPHAGOGASTRODUODENOSCOPY (EGD) WITH PROPOFOL
Anesthesia: General

## 2020-05-01 MED ORDER — DEXAMETHASONE SODIUM PHOSPHATE 10 MG/ML IJ SOLN
INTRAMUSCULAR | Status: DC | PRN
  Administered 2020-05-01: 4 mg via INTRAVENOUS

## 2020-05-01 MED ORDER — LACTATED RINGERS IV SOLN: INTRAVENOUS | Status: DC | PRN

## 2020-05-01 MED ORDER — HYDROCODONE-ACETAMINOPHEN 5-325 MG PO TABS
1.0000 | ORAL_TABLET | ORAL | Status: DC | PRN
  Administered 2020-05-01: 1 via ORAL
  Administered 2020-05-02 (×3): 2 via ORAL
  Administered 2020-05-03 – 2020-05-04 (×2): 1 via ORAL
  Administered 2020-05-04: 2 via ORAL
  Administered 2020-05-04 – 2020-05-05 (×2): 1 via ORAL
  Filled 2020-05-01 (×3): qty 2
  Filled 2020-05-01: qty 1
  Filled 2020-05-01 (×2): qty 2
  Filled 2020-05-01 (×2): qty 1
  Filled 2020-05-01: qty 2

## 2020-05-01 MED ORDER — SODIUM CHLORIDE 0.9 % IV SOLN
INTRAVENOUS | Status: DC | PRN
Start: 2020-05-01 — End: 2020-05-01

## 2020-05-01 MED ORDER — PROPOFOL 10 MG/ML IV BOLUS
INTRAVENOUS | Status: DC | PRN
  Administered 2020-05-01: 150 mg via INTRAVENOUS

## 2020-05-01 MED ORDER — ACETAMINOPHEN 10 MG/ML IV SOLN
1000.0000 mg | Freq: Once | INTRAVENOUS | Status: AC | PRN
  Filled 2020-05-01: qty 100

## 2020-05-01 MED ORDER — ONDANSETRON HCL 4 MG/2ML IJ SOLN: 4.0000 mg | Freq: Once | INTRAMUSCULAR | Status: DC | PRN

## 2020-05-01 MED ORDER — SUCCINYLCHOLINE CHLORIDE 20 MG/ML IJ SOLN
INTRAMUSCULAR | Status: DC | PRN
  Administered 2020-05-01: 80 mg via INTRAVENOUS

## 2020-05-01 MED ORDER — SUGAMMADEX SODIUM 200 MG/2ML IV SOLN
INTRAVENOUS | Status: DC | PRN
  Administered 2020-05-01: 100 mg via INTRAVENOUS
  Administered 2020-05-01: 200 mg via INTRAVENOUS

## 2020-05-01 MED ORDER — FENTANYL CITRATE (PF) 100 MCG/2ML IJ SOLN
INTRAMUSCULAR | Status: AC
  Filled 2020-05-01: qty 2

## 2020-05-01 MED ORDER — PHENYLEPHRINE 40 MCG/ML (10ML) SYRINGE FOR IV PUSH (FOR BLOOD PRESSURE SUPPORT)
PREFILLED_SYRINGE | INTRAVENOUS | Status: AC
  Filled 2020-05-01: qty 10

## 2020-05-01 MED ORDER — ROCURONIUM BROMIDE 10 MG/ML (PF) SYRINGE
PREFILLED_SYRINGE | INTRAVENOUS | Status: DC | PRN
  Administered 2020-05-01: 40 mg via INTRAVENOUS
  Administered 2020-05-01 (×2): 20 mg via INTRAVENOUS
  Administered 2020-05-01: 30 mg via INTRAVENOUS

## 2020-05-01 MED ORDER — FENTANYL CITRATE (PF) 100 MCG/2ML IJ SOLN
25.0000 ug | INTRAMUSCULAR | Status: DC | PRN
  Administered 2020-05-01 (×3): 25 ug via INTRAVENOUS
  Filled 2020-05-01: qty 2

## 2020-05-01 MED ORDER — LACTATED RINGERS IV SOLN
INTRAVENOUS | Status: DC | PRN
Start: 2020-05-01 — End: 2020-05-01

## 2020-05-01 MED ORDER — LACTATED RINGERS IV SOLN: INTRAVENOUS | Status: DC

## 2020-05-01 MED ORDER — VASOPRESSIN 20 UNIT/ML IV SOLN
INTRAVENOUS | Status: AC
  Filled 2020-05-01: qty 1

## 2020-05-01 MED ORDER — SODIUM CHLORIDE 0.9 % IV SOLN: INTRAVENOUS | Status: DC | PRN

## 2020-05-01 MED ORDER — NOREPINEPHRINE 4 MG/250ML-% IV SOLN
INTRAVENOUS | Status: AC
  Filled 2020-05-01: qty 250

## 2020-05-01 MED ORDER — PHENYLEPHRINE 40 MCG/ML (10ML) SYRINGE FOR IV PUSH (FOR BLOOD PRESSURE SUPPORT)
PREFILLED_SYRINGE | INTRAVENOUS | Status: AC
  Filled 2020-05-01: qty 40

## 2020-05-01 MED ORDER — ONDANSETRON HCL 4 MG/2ML IJ SOLN
INTRAMUSCULAR | Status: DC | PRN
  Administered 2020-05-01: 4 mg via INTRAVENOUS

## 2020-05-01 MED ORDER — FENTANYL CITRATE (PF) 100 MCG/2ML IJ SOLN
INTRAMUSCULAR | Status: DC | PRN
  Administered 2020-05-01: 50 ug via INTRAVENOUS

## 2020-05-01 MED ORDER — SODIUM CHLORIDE 0.9% IV SOLUTION: Freq: Once | INTRAVENOUS | Status: DC

## 2020-05-01 MED ORDER — PHENYLEPHRINE HCL-NACL 10-0.9 MG/250ML-% IV SOLN
INTRAVENOUS | Status: DC | PRN
  Administered 2020-05-01: 30 ug/min via INTRAVENOUS

## 2020-05-01 MED ORDER — STERILE WATER FOR IRRIGATION IR SOLN
Status: DC | PRN
  Administered 2020-05-01: 1.5 mL

## 2020-05-01 MED ORDER — SODIUM CHLORIDE 0.9 % IV BOLUS
1000.0000 mL | Freq: Once | INTRAVENOUS | Status: AC
  Administered 2020-05-01: 1000 mL via INTRAVENOUS

## 2020-05-01 MED ORDER — IOHEXOL 300 MG/ML  SOLN: 150.0000 mL | Freq: Once | INTRAMUSCULAR | Status: DC | PRN

## 2020-05-01 MED ORDER — LIDOCAINE 2% (20 MG/ML) 5 ML SYRINGE
INTRAMUSCULAR | Status: DC | PRN
  Administered 2020-05-01: 60 mg via INTRAVENOUS

## 2020-05-01 MED ORDER — GLYCOPYRROLATE 0.2 MG/ML IJ SOLN
INTRAMUSCULAR | Status: AC
  Filled 2020-05-01: qty 1

## 2020-05-01 MED ORDER — GLYCOPYRROLATE 0.2 MG/ML IJ SOLN
0.2000 mg | Freq: Once | INTRAMUSCULAR | Status: AC
  Administered 2020-05-01: 0.2 mg via INTRAVENOUS

## 2020-05-01 MED ORDER — PROPOFOL 10 MG/ML IV BOLUS
INTRAVENOUS | Status: AC
  Filled 2020-05-01: qty 40

## 2020-05-01 MED ORDER — PHENYLEPHRINE HCL (PRESSORS) 10 MG/ML IV SOLN
INTRAVENOUS | Status: DC | PRN
  Administered 2020-05-01: 80 ug via INTRAVENOUS
  Administered 2020-05-01: 120 ug via INTRAVENOUS
  Administered 2020-05-01: 160 ug via INTRAVENOUS
  Administered 2020-05-01: 120 ug via INTRAVENOUS
  Administered 2020-05-01 (×2): 160 ug via INTRAVENOUS
  Administered 2020-05-01 (×2): 200 ug via INTRAVENOUS

## 2020-05-01 MED ORDER — CHLORHEXIDINE GLUCONATE CLOTH 2 % EX PADS
6.0000 | MEDICATED_PAD | Freq: Every day | CUTANEOUS | Status: DC
  Administered 2020-05-01 – 2020-05-04 (×3): 6 via TOPICAL

## 2020-05-01 MED ORDER — LIDOCAINE VISCOUS HCL 2 % MT SOLN
15.0000 mL | Freq: Once | OROMUCOSAL | Status: AC
  Administered 2020-05-01: 15 mL via OROMUCOSAL

## 2020-05-01 MED ORDER — PROPOFOL 10 MG/ML IV BOLUS
INTRAVENOUS | Status: DC | PRN
  Administered 2020-05-01: 10 mg via INTRAVENOUS
  Administered 2020-05-01: 50 mg via INTRAVENOUS
  Administered 2020-05-01 (×2): 20 mg via INTRAVENOUS
  Administered 2020-05-01: 10 mg via INTRAVENOUS
  Administered 2020-05-01: 20 mg via INTRAVENOUS
  Administered 2020-05-01: 10 mg via INTRAVENOUS

## 2020-05-01 MED ORDER — LIDOCAINE VISCOUS HCL 2 % MT SOLN
OROMUCOSAL | Status: AC
  Filled 2020-05-01: qty 15

## 2020-05-01 MED ORDER — LIDOCAINE HCL 1 % IJ SOLN
INTRAMUSCULAR | Status: AC
  Filled 2020-05-01: qty 20

## 2020-05-01 MED ORDER — PHENYLEPHRINE HCL (PRESSORS) 10 MG/ML IV SOLN
INTRAVENOUS | Status: DC | PRN
  Administered 2020-05-01 (×2): 120 ug via INTRAVENOUS

## 2020-05-01 MED ORDER — ALBUMIN HUMAN 5 % IV SOLN: INTRAVENOUS | Status: DC | PRN

## 2020-05-01 MED ORDER — NOREPINEPHRINE 4 MG/250ML-% IV SOLN
0.0000 ug/min | INTRAVENOUS | Status: DC
  Administered 2020-05-01: 5 ug/min via INTRAVENOUS
  Filled 2020-05-01: qty 250

## 2020-05-01 NOTE — Progress Notes (Signed)
Called PACU to make them aware of pt arrival sometime this evening post procedure; spoke to [place], Charity fundraiser. Called [facility] to make them aware of pt arrival later this evening after procedure.

## 2020-05-01 NOTE — Sedation Documentation (Signed)
RT made aware of potential need for ventilator in PACU

## 2020-05-01 NOTE — Transfer of Care (Signed)
Immediate Anesthesia Transfer of Care Note  Patient: Nicole Welch  Procedure(s) Performed: IR WITH ANESTHESIA (N/A )  Patient Location: PACU  Anesthesia Type:General  Level of Consciousness: awake, alert  and patient cooperative  Airway & Oxygen Therapy: Patient Spontanous Breathing and Patient connected to face mask oxygen  Post-op Assessment: Report given to RN and Post -op Vital signs reviewed and stable  Post vital signs: Reviewed and stable  Last Vitals:  Vitals Value Taken Time  BP 102/42 05/01/20 1900  Temp    Pulse 92 05/01/20 1904  Resp 24 05/01/20 1904  SpO2 100 % 05/01/20 1904  Vitals shown include unvalidated device data.  Last Pain:  Vitals:   05/01/20 1200  TempSrc: Oral  PainSc: 9       Patients Stated Pain Goal: 2 (05/01/20 1200)  Complications: No complications documented.

## 2020-05-01 NOTE — Procedures (Signed)
  Procedure: Mesenteric arteriogram, coil embo gastroduodenal artery   EBL:   minimal Complications:  none immediate  See full dictation in [employer].  Thora Lance MD Main # [phone] Pager  [phone]

## 2020-05-01 NOTE — Sedation Documentation (Signed)
Pt remains intubated and sedated at this time, under the care of anesthesia

## 2020-05-01 NOTE — Anesthesia Procedure Notes (Signed)
Arterial Line Insertion Start/End8/06/2020 5:25 AM Performed by: Mayer Camel, CRNA  Preanesthetic checklist: patient identified, IV checked, site marked, risks and benefits discussed, surgical consent, monitors and equipment checked, pre-op evaluation, timeout performed and anesthesia consent Left, radial was placed Catheter size: 20 G Hand hygiene performed  and maximum sterile barriers used   Attempts: 1 Procedure performed using ultrasound guided technique. Ultrasound Notes:anatomy identified, needle tip was noted to be adjacent to the nerve/plexus identified and no ultrasound evidence of intravascular and/or intraneural injection Following insertion, dressing applied and Biopatch. Post procedure assessment: normal and unchanged  Patient tolerated the procedure well with no immediate complications.

## 2020-05-01 NOTE — Sedation Documentation (Signed)
Procedure finished. 

## 2020-05-01 NOTE — Sedation Documentation (Signed)
Bedside report given to savannah RN PACU

## 2020-05-01 NOTE — Anesthesia Procedure Notes (Signed)
Procedure Name: Intubation Date/Time: 05/01/2020 3:39 PM Performed by: Epifanio Lesches, CRNA Pre-anesthesia Checklist: Patient identified, Emergency Drugs available, Suction available, Patient being monitored and Timeout performed Patient Re-evaluated:Patient Re-evaluated prior to induction Oxygen Delivery Method: Circle system utilized Preoxygenation: Pre-oxygenation with 100% oxygen Induction Type: IV induction Laryngoscope Size: Miller and 2 Grade View: Grade I Tube size: 7.0 mm Number of attempts: 1 Airway Equipment and Method: Stylet Placement Confirmation: positive ETCO2,  CO2 detector and breath sounds checked- equal and bilateral Secured at: 21 cm Tube secured with: Tape Dental Injury: Teeth and Oropharynx as per pre-operative assessment

## 2020-05-01 NOTE — Progress Notes (Signed)
Nicole Welch, M.D. Gastroenterology & Hepatology    Interval History:  NAEON. Patient reports feeling well today, was able to tolerate diet yesterday. She denies having any more episodes melena or hematochezia since yesterday.  States she feels hungry.  Denies having any abdominal pain or any complaint at the moment.  Last hemoglobin from yesterday decreased to 9.1.  Inpatient Medications:  Current Facility-Administered Medications:  .  0.9 %  sodium chloride infusion, , Intravenous, Continuous, Johnson, Clanford L, MD, Last Rate: 10 mL/hr at 04/30/20 0819, Rate Change at 04/30/20 0819 .  acetaminophen (TYLENOL) tablet 650 mg, 650 mg, Oral, Q6H PRN **OR** acetaminophen (TYLENOL) suppository 650 mg, 650 mg, Rectal, Q6H PRN, Elgergawy, Silver Huguenin, MD .  ALPRAZolam Duanne Moron) tablet 0.5 mg, 0.5 mg, Oral, TID PRN, Elgergawy, Silver Huguenin, MD, 0.5 mg at 04/30/20 2145 .  calcium-vitamin D (OSCAL WITH D) 500-200 MG-UNIT per tablet 1 tablet, 1 tablet, Oral, Daily, Elgergawy, Silver Huguenin, MD, 1 tablet at 04/30/20 0818 .  cyclobenzaprine (FLEXERIL) tablet 10 mg, 10 mg, Oral, BID PRN, Elgergawy, Silver Huguenin, MD .  furosemide (LASIX) tablet 20 mg, 20 mg, Oral, Daily, Johnson, Clanford L, MD, 20 mg at 04/30/20 0817 .  nicotine (NICODERM CQ - dosed in mg/24 hr) patch 7 mg, 7 mg, Transdermal, Daily, Elgergawy, Silver Huguenin, MD, 7 mg at 04/30/20 0820 .  ondansetron (ZOFRAN) tablet 4 mg, 4 mg, Oral, Q6H PRN **OR** ondansetron (ZOFRAN) injection 4 mg, 4 mg, Intravenous, Q6H PRN, Elgergawy, Silver Huguenin, MD .  oxyCODONE-acetaminophen (PERCOCET/ROXICET) 5-325 MG per tablet 1 tablet, 1 tablet, Oral, Q6H PRN, Elgergawy, Silver Huguenin, MD, 1 tablet at 04/30/20 1935 .  pantoprazole (PROTONIX) 80 mg in sodium chloride 0.9 % 100 mL (0.8 mg/mL) infusion, 8 mg/hr, Intravenous, Continuous, Elgergawy, Silver Huguenin, MD, Last Rate: 10 mL/hr at 05/01/20 0431, 8 mg/hr at 05/01/20 0431 .  [START ON 05/02/2020] pantoprazole (PROTONIX) injection 40 mg, 40 mg,  Intravenous, Q12H, Elgergawy, Dawood S, MD .  potassium chloride (KLOR-CON) packet 30 mEq, 30 mEq, Oral, Daily, Elgergawy, Silver Huguenin, MD, 30 mEq at 04/30/20 0818 .  simvastatin (ZOCOR) tablet 40 mg, 40 mg, Oral, QHS, Elgergawy, Silver Huguenin, MD, 40 mg at 04/30/20 2145 .  sucralfate (CARAFATE) 1 GM/10ML suspension 1 g, 1 g, Oral, TID WC & HS, Rehman, Najeeb U, MD, 1 g at 04/30/20 2145 .  vitamin B-12 (CYANOCOBALAMIN) tablet 1,000 mcg, 1,000 mcg, Oral, Daily, Wynetta Emery, Clanford L, MD   I/O    Intake/Output Summary (Last 24 hours) at 05/01/2020 0825 Last data filed at 04/30/2020 1500 Gross per 24 hour  Intake 197.66 ml  Output --  Net 197.66 ml     Physical Exam: Temp:  [98 F (36.7 C)-98.5 F (36.9 C)] 98 F (36.7 C) (08/10 0517) Pulse Rate:  [84-88] 88 (08/10 0517) Resp:  [16-20] 20 (08/10 0517) BP: (96-100)/(45-53) 96/45 (08/10 0517) SpO2:  [95 %-99 %] 95 % (08/10 0517)  Temp (24hrs), Avg:98.2 F (36.8 C), Min:98 F (36.7 C), Max:98.5 F (36.9 C) GENERAL: The patient is AO x3, in no acute distress. HEENT: Head is normocephalic and atraumatic. EOMI are intact. Mouth is well hydrated and without lesions. Mild mucosal pallor. NECK: Supple. No masses LUNGS: Clear to auscultation. No presence of rhonchi/wheezing/rales. Adequate chest expansion HEART: RRR, normal s1 and s2. ABDOMEN: Soft, nontender, no guarding, no peritoneal signs, and nondistended. BS +. No masses. EXTREMITIES: Without any cyanosis, clubbing, rash, lesions or edema. NEUROLOGIC: AOx3, no focal motor deficit. SKIN: no jaundice, no  rashes  Laboratory Data: CBC:     Component Value Date/Time   WBC 12.2 (H) 04/30/2020 0507   RBC 2.82 (L) 04/30/2020 0507   HGB 9.1 (L) 04/30/2020 0507   HCT 29.2 (L) 04/30/2020 0507   PLT 431 (H) 04/30/2020 0507   MCV 103.5 (H) 04/30/2020 0507   MCH 32.3 04/30/2020 0507   MCHC 31.2 04/30/2020 0507   RDW 13.0 04/30/2020 0507   LYMPHSABS 1.1 04/28/2020 2035   MONOABS 0.7 04/28/2020 2035    EOSABS 0.0 04/28/2020 2035   BASOSABS 0.1 04/28/2020 2035   COAG:  Lab Results  Component Value Date   INR 1.2 04/29/2020    BMP:  BMP Latest Ref Rng & Units 04/30/2020 04/29/2020 04/28/2020  Glucose 70 - 99 mg/dL 96 101(H) 186(H)  BUN 8 - 23 mg/dL 16 43(H) 32(H)  Creatinine 0.44 - 1.00 mg/dL 0.67 0.83 1.08(H)  Sodium 135 - 145 mmol/L 141 141 135  Potassium 3.5 - 5.1 mmol/L 4.4 4.8 5.0  Chloride 98 - 111 mmol/L 106 101 98  CO2 22 - 32 mmol/L 27 30 26   Calcium 8.9 - 10.3 mg/dL 8.6(L) 8.5(L) 8.6(L)    HEPATIC:  Hepatic Function Latest Ref Rng & Units 04/28/2020 07/15/2018 07/14/2018  Total Protein 6.5 - 8.1 g/dL 7.7 7.2 6.7  Albumin 3.5 - 5.0 g/dL 3.0(L) 3.2(L) 3.0(L)  AST 15 - 41 U/L 15 32 40  ALT 0 - 44 U/L 11 28 32  Alk Phosphatase 38 - 126 U/L 86 115 116  Total Bilirubin 0.3 - 1.2 mg/dL 0.5 1.4(H) 1.4(H)  Bilirubin, Direct 0.0 - 0.3 mg/dL - - -    CARDIAC:  Lab Results  Component Value Date   TROPONINI 0.14 ([place]) 07/14/2018      Imaging: I personally reviewed and interpreted the available labs, imaging and endoscopic files.   Assessment/Plan: 64 year old female with past medical history of anxiety, chronic back pain, chronic use of NSAIDs, osteoporosis, dCHF, who came to the hospital after presenting episodes of melena and fatigue.  The patient was noted to have a major drop in her hemoglobin from 17 to a level of 12.9 g/dL, with most recent one 9.1.  She had initial tachycardia but has responded to IV fluids, has not required any blood transfusion.  Underwent EGD on 04/29/2020 which showed presence of a 2 cm hiatal hernia, erythema and edema in gastric antrum with presence of double pylorus.  Notably there was a large cratered ulcer in the duodenal bulb with adherent clot but no bleeding was induced when this was washed.  The patient has had adequate clinical course with IV PPI and has tolerated diet properly.  We will proceed with a repeat EGD today with plan for potential  discharge if she presents stability from the ulcer standpoint.  - Repeat CBC qday, transfuse if Hb <7 - Pantoprazole ggt - 2 large bore IV lines - Active T/S - Keep NPO - Avoid NSAIDs -patient advised to avoid these medications in the long-term - Will proceed with EGD today -Patient will require repeat EGD to assess wound healing 3 months after discharge.  Nicole Peppers, MD Gastroenterology and Hepatology [facility]  Note: Occasional unusual wording and randomly placed punctuation marks may result from the use of speech recognition technology to transcribe this document

## 2020-05-01 NOTE — Anesthesia Preprocedure Evaluation (Signed)
Anesthesia Evaluation  Patient identified by MRN, date of birth, ID band Patient awake    Reviewed: Allergy & Precautions, H&P , NPO status , Patient's Chart, lab work & pertinent test results, reviewed documented beta blocker date and time   Airway Mallampati: II  TM Distance: >3 FB Neck ROM: full    Dental no notable dental hx. (+) Teeth Intact   Pulmonary neg pulmonary ROS, former smoker,    Pulmonary exam normal breath sounds clear to auscultation       Cardiovascular Exercise Tolerance: Good +CHF   Rhythm:regular Rate:Normal     Neuro/Psych PSYCHIATRIC DISORDERS Anxiety Depression negative neurological ROS     GI/Hepatic Neg liver ROS, PUD,   Endo/Other  negative endocrine ROS  Renal/GU negative Renal ROS  negative genitourinary   Musculoskeletal   Abdominal   Peds  Hematology negative hematology ROS (+)   Anesthesia Other Findings   Reproductive/Obstetrics negative OB ROS                             Anesthesia Physical Anesthesia Plan  ASA: III  Anesthesia Plan: General   Post-op Pain Management:    Induction:   PONV Risk Score and Plan: Propofol infusion  Airway Management Planned:   Additional Equipment:   Intra-op Plan:   Post-operative Plan:   Informed Consent: I have reviewed the patients History and Physical, chart, labs and discussed the procedure including the risks, benefits and alternatives for the proposed anesthesia with the patient or authorized representative who has indicated his/her understanding and acceptance.     Dental Advisory Given  Plan Discussed with: CRNA  Anesthesia Plan Comments:         Anesthesia Quick Evaluation

## 2020-05-01 NOTE — Transfer of Care (Signed)
Immediate Anesthesia Transfer of Care Note  Patient: Nicole Welch  Procedure(s) Performed: ESOPHAGOGASTRODUODENOSCOPY (EGD) WITH PROPOFOL (N/A )  Patient Location: PACU  Anesthesia Type:General  Level of Consciousness: awake, alert , oriented and patient cooperative  Airway & Oxygen Therapy: Patient Spontanous Breathing and Patient connected to nasal cannula oxygen  Post-op Assessment: Report given to RN, Post -op Vital signs reviewed and stable and Patient moving all extremities X 4  Post vital signs: Reviewed and stable  Last Vitals:  Vitals Value Taken Time  BP 102/44 05/01/20 1112  Temp 37.1 C 05/01/20 1109  Pulse 82 05/01/20 1113  Resp 18 05/01/20 1113  SpO2 99 % 05/01/20 1113  Vitals shown include unvalidated device data.  Last Pain:  Vitals:   05/01/20 1109  TempSrc: Oral  PainSc: 0-No pain      Patients Stated Pain Goal: 7 (05/01/20 5027)  Complications: No complications documented.

## 2020-05-01 NOTE — Progress Notes (Signed)
   Pt is scheduled to come to [facility] for mesenteric arteriogram with possible embolization  She is to come via ambulance-- to be arranged by [facility] RN She will return to [facility] after procedure  RN is aware of plans  MD is aware we need order for procedure

## 2020-05-01 NOTE — Addendum Note (Signed)
Addendum  created 05/01/20 2992 by Molli Barrows, MD   Charge Capture section accepted

## 2020-05-01 NOTE — Consult Note (Signed)
Notified by Dr. 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  I did review the notes from Dr. Levon Hedger of GI.  Strongly recommend interventional radiologic evaluation and possible treatment to stop this bleeding.  Will be available for backup should this intervention fail.

## 2020-05-01 NOTE — Anesthesia Postprocedure Evaluation (Signed)
Anesthesia Post Note  Patient: Nicole Welch  Procedure(s) Performed: ESOPHAGOGASTRODUODENOSCOPY (EGD) WITH PROPOFOL (N/A )  Patient location during evaluation: PACU Anesthesia Type: General Level of consciousness: awake, oriented, awake and alert and patient cooperative Pain management: pain level controlled Vital Signs Assessment: post-procedure vital signs reviewed and stable Respiratory status: spontaneous breathing, respiratory function stable, nonlabored ventilation and patient connected to nasal cannula oxygen Cardiovascular status: stable and blood pressure returned to baseline Postop Assessment: no apparent nausea or vomiting Anesthetic complications: no Comments: Phenylepherine, given IV upon first set of VS in recovery. BP up to baseline.   No complications documented.   Last Vitals:  Vitals:   05/01/20 0942 05/01/20 1109  BP: (!) 113/57 (!) 92/36  Pulse: 95   Resp: 17 20  Temp: 37.1 C 37.1 C  SpO2: 96% 99%    Last Pain:  Vitals:   05/01/20 1109  TempSrc: Oral  PainSc: 0-No pain                 Nicole Welch

## 2020-05-01 NOTE — Anesthesia Preprocedure Evaluation (Signed)
Anesthesia Evaluation  Patient identified by MRN, date of birth, ID band Patient awake    Reviewed: Allergy & Precautions, NPO status , Patient's Chart, lab work & pertinent test results  Airway Mallampati: II  TM Distance: >3 FB Neck ROM: Full    Dental  (+) Edentulous Upper, Edentulous Lower   Pulmonary shortness of breath and with exertion, Current Smoker,    Pulmonary exam normal  + decreased breath sounds      Cardiovascular +CHF (diastolic dysfunction)   Rhythm:Regular Rate:Tachycardia  Last echo 2019: Left ventricle: The cavity size was normal. Systolic function was  normal. The estimated ejection fraction was in the range of 60%  to 65%. Wall motion was normal; there were no regional wall  motion abnormalities.    Neuro/Psych PSYCHIATRIC DISORDERS Anxiety Depression negative neurological ROS     GI/Hepatic Neg liver ROS, PUD, Bleeding pyloric ulcer, vomiting blood. Currently receiving pRBCs en route from [place]   Endo/Other  negative endocrine ROS  Renal/GU negative Renal ROS  negative genitourinary   Musculoskeletal  (+) Arthritis , Osteoarthritis,    Abdominal (+) + obese,   Peds  Hematology  (+) Blood dyscrasia, anemia , GIB- last H/H 8.1/25, 2prbcs today   Anesthesia Other Findings   Reproductive/Obstetrics negative OB ROS                             Anesthesia Physical Anesthesia Plan  ASA: III and emergent  Anesthesia Plan: General   Post-op Pain Management:    Induction: Intravenous, Rapid sequence and Cricoid pressure planned  PONV Risk Score and Plan: 3 and Ondansetron, Treatment may vary due to age or medical condition and Dexamethasone  Airway Management Planned: Oral ETT  Additional Equipment: None  Intra-op Plan:   Post-operative Plan: Extubation in OR  Informed Consent: I have reviewed the patients History and Physical, chart, labs and  discussed the procedure including the risks, benefits and alternatives for the proposed anesthesia with the patient or authorized representative who has indicated his/her understanding and acceptance.     Dental advisory given  Plan Discussed with: CRNA  Anesthesia Plan Comments: (Requested to help with titration of levophed and moderate sedation, however patient actively vomiting blood so we will proceed with GA/ETT and RSI for airway protection. 2 large bore PIVs, possible arterial line, possible continued need for blood transfusion.)        Anesthesia Quick Evaluation

## 2020-05-01 NOTE — Op Note (Addendum)
[facility] Patient Name: Nicole Welch Procedure Date: 05/01/2020 9:40 AM MRN: [id number] Date of Birth: [birth year] Attending MD: Katrinka Blazing ,  CSN: [id number] Age: 64 Admit Type: Outpatient Procedure:                Upper GI endoscopy Indications:              Melena Providers:                Katrinka Blazing, Buel Ream. Thomasena Edis RN, RN, Crystal                            Page, Burke Keels, Technician Referring MD:              Medicines:                Monitored Anesthesia Care Complications:            No immediate complications. Estimated Blood Loss:     Estimated blood loss: none. Procedure:                Pre-Anesthesia Assessment:                           - Prior to the procedure, a History and Physical                            was performed, and patient medications, allergies                            and sensitivities were reviewed. The patient's                            tolerance of previous anesthesia was reviewed.                           - The risks and benefits of the procedure and the                            sedation options and risks were discussed with the                            patient. All questions were answered and informed                            consent was obtained.                           - ASA Grade Assessment: III - A patient with severe                            systemic disease.                           After obtaining informed consent, the endoscope was                            passed under direct vision. Throughout the  procedure, the patient's blood pressure, pulse, and                            oxygen saturations were monitored continuously. The                            [id number]-H190 ([id number]) scope was introduced through the                            mouth, and advanced to the second part of duodenum.                            The upper GI endoscopy was performed with                             difficulty due to persistent oozing behind clot                            which could not be removed. The patient tolerated                            the procedure well. Scope In: 10:21:39 AM Scope Out: 11:01:09 AM Total Procedure Duration: 0 hours 39 minutes 30 seconds  Findings:      The examined esophagus was normal.      Clotted blood was found in the gastric antrum coming from the duodenum.       There was no presence of fresh blood at that moment. I used a 20 mm cold       snare to cut the proximal 2/3 of the clot to adequately visualize the       pylorus. There was presence of two clots, one coming from the pylorus       and another coming from the previous false lumen from the fistulous tract      Clotted blood was found in the ampulla in the anteromedial wall. It       could not be removed with washing.      Upon withdrawal of the scope there was visualization of active blood       oozing from the fistulous tract, Despite lavaging the area, the clot       could not be dettached. I consider the bleeding is coming from behind       the duodenal ulcer clot. The procedure was terminated as visualization       of the bleeding vessel could not be achieved and the duodenal ulcer has       a large size with risk of perforation. Impression:               - Normal esophagus.                           - Clotted blood in the gastric antrum coming from                            the duodenum.                           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                           - Large clot in the ampulla, was not removed.                           - No specimens collected. Moderate Sedation:      Per Anesthesia Care Recommendation:           - Return patient to hospital ward for ongoing care.                           - NPO.                           - Continue present medications, including PPI drip.                            - Transfer for IR embolization of bleeding vessel. Procedure Code(s):        --- Professional ---                           [id number], GC, Esophagogastroduodenoscopy, flexible,                            transoral; diagnostic, including collection of                            specimen(s) by brushing or washing, when performed                            (separate procedure) Diagnosis Code(s):        --- Professional ---                           K92.2, Gastrointestinal hemorrhage, unspecified                           K92.1, Melena (includes Hematochezia) CPT copyright 2019 [employer]. All rights reserved. The codes documented in this report are preliminary and upon coder review may  be revised to meet current compliance requirements. Katrinka Blazing, MD Katrinka Blazing,  05/01/2020 11:32:36 AM This report has been signed electronically. Number of Addenda: 0

## 2020-05-01 NOTE — H&P (Signed)
Chief Complaint: Patient was seen in consultation today for mesenteric arteriogram with possible embolization.   Referring Physician(s): Johnson,Clanford L  Supervising Physician: Oley Balm  Patient Status: Nicole Welch - inpatient. Patient will be transported back to [facility] after the procedure.   History of Present Illness: Nicole Welch is a 64 y.o. female with a medical history that includes CHF, anxiety, depression and back pain. She presented to the ED 04/28/20 with weakness, nausea/vomiting and black stools. She had a positive hemoccult. She was admitted and was started on a protonix drip.   She had an EGD 04/29/20 which showed a large bulbar ulcer with adherent clot but no bleeding. No intervention was performed. The patient sustained a significant hemoglobin drop and a repeat EGD was performed today, 05/01/20 - There is ongoing active oozing from a pyloric channel ulcer.   Interventional Radiology has been asked to evaluate this patient for a mesenteric arteriogram with possible embolization. This case has been reviewed and procedure approved by Dr. Deanne Coffer.     Past Medical History:  Diagnosis Date  . Anxiety   . Back pain   . Chronic neck pain   . DDD (degenerative disc disease), cervical   . Depression   . DJD (degenerative joint disease)   . Gait abnormality   . Osteoporosis     Past Surgical History:  Procedure Laterality Date  . ABDOMINAL HYSTERECTOMY    . CESAREAN SECTION    . ESOPHAGOGASTRODUODENOSCOPY (EGD) WITH PROPOFOL N/A 04/29/2020   Procedure: ESOPHAGOGASTRODUODENOSCOPY (EGD) WITH PROPOFOL;  Surgeon: Malissa Hippo, MD;  Location: [facility];  Service: Endoscopy;  Laterality: N/A;  . TUBAL LIGATION      Allergies: Patient has no known allergies.  Medications: Prior to Admission medications   Medication Sig Start Date End Date Taking? Authorizing Provider  ALPRAZolam Prudy Feeler) 0.5 MG tablet Take 0.5 mg by mouth 3 (three) times daily as  needed for anxiety. 02/10/20   [provider]  aspirin EC 81 MG EC tablet Take 1 tablet (81 mg total) by mouth daily. 07/24/18   Dhungel, Theda Belfast, MD  Calcium Carb-Cholecalciferol (CALCIUM + D3) 600-200 MG-UNIT TABS Take 1 tablet by mouth daily.     [provider]  cyclobenzaprine (FLEXERIL) 10 MG tablet Take 1 tablet by mouth 2 (two) times daily as needed for muscle spasms. 03/25/20   [provider]  ezetimibe (ZETIA) 10 MG tablet Take 10 mg by mouth at bedtime. 06/01/18   [provider]  furosemide (LASIX) 40 MG tablet Take 60 mg by mouth 2 (two) times daily.     [provider]  nicotine (NICODERM CQ - DOSED IN MG/24 HR) 7 mg/24hr patch Place 1 patch (7 mg total) onto the skin daily. 07/24/18   Dhungel, Theda Belfast, MD  oxyCODONE-acetaminophen (PERCOCET) 5-325 MG per tablet Take 1 tablet by mouth every 6 (six) hours as needed. Back pain 10/21/10   [provider]  potassium chloride (KLOR-CON) 20 MEQ packet Take 30 mEq by mouth daily. 07/23/18   Dhungel, Theda Belfast, MD  simvastatin (ZOCOR) 40 MG tablet Take 40 mg by mouth at bedtime. 04/26/18   [provider]     Family History  Problem Relation Age of Onset  . Heart disease Other        family history   . Arthritis Other        family history   . CAD Father        9's    Social  History   Socioeconomic History  . Marital status: Married    Spouse name: Not on file  . Number of children: Not on file  . Years of education: ged   . Highest education level: Not on file  Occupational History  . Occupation: Set designer work   Tobacco Use  . Smoking status: Former Smoker    Packs/day: 1.00    Types: Cigarettes  . Smokeless tobacco: Never Used  Vaping Use  . Vaping Use: Never used  Substance and Sexual Activity  . Alcohol use: No  . Drug use: No  . Sexual activity: Not on file  Other Topics Concern  . Not on file  Social History Narrative  . Not on file   Social  Determinants of Health   Financial Resource Strain:   . Difficulty of Paying Living Expenses:   Food Insecurity:   . Worried About Programme researcher, broadcasting/film/video in the Last Year:   . Barista in the Last Year:   Transportation Needs:   . Freight forwarder (Medical):   Marland Kitchen Lack of Transportation (Non-Medical):   Physical Activity:   . Days of Exercise per Week:   . Minutes of Exercise per Session:   Stress:   . Feeling of Stress :   Social Connections:   . Frequency of Communication with Friends and Family:   . Frequency of Social Gatherings with Friends and Family:   . Attends Religious Services:   . Active Member of Clubs or Organizations:   . Attends Banker Meetings:   Marland Kitchen Marital Status:     Review of Systems: A 12 point ROS discussed and pertinent positives are indicated in the HPI above.  All other systems are negative.  Review of Systems  Unable to perform ROS: Acuity of condition    Vital Signs: From [facility]  Temp: 98.5, BP 57/19, HR 118, O2 Sats 100% on 4L [place]  Physical Exam Constitutional:      Appearance: She is ill-appearing.  HENT:     Mouth/Throat:     Mouth: Mucous membranes are moist.     Pharynx: Oropharynx is clear.  Cardiovascular:     Rate and Rhythm: Tachycardia present.     Heart sounds: Murmur heard.   Pulmonary:     Effort: Pulmonary effort is normal.     Breath sounds: Normal breath sounds.  Abdominal:     General: Bowel sounds are normal.     Palpations: Abdomen is soft.     Tenderness: There is abdominal tenderness.  Skin:    General: Skin is warm and dry.  Neurological:     Mental Status: She is alert and oriented to person, place, and time.     Imaging: DG Chest Portable 1 View  Result Date: 04/28/2020 CLINICAL DATA:  Hypoxemia, generalized weakness, nausea and vomiting EXAM: PORTABLE CHEST 1 VIEW COMPARISON:  07/19/2018 FINDINGS: Heart size and pulmonary vascularity are normal. Slight fibrosis or atelectasis in the  left lung base. No consolidation or airspace disease. No pleural effusions. No pneumothorax. Mediastinal contours appear intact. IMPRESSION: Slight fibrosis or atelectasis in the left lung base. No evidence of active pulmonary disease. Electronically Signed   By: Burman Nieves M.D.   On: 04/28/2020 21:41    Labs:  CBC: Recent Labs    04/29/20 0706 04/30/20 0507 05/01/20 0411 05/01/20 1129  WBC 17.7* 12.2* 9.2 10.5  HGB 10.5* 9.1* 8.9* 8.1*  HCT 33.3* 29.2* 28.8* 25.2*  PLT 469*  431* 461* 421*    COAGS: Recent Labs    04/29/20 0706  INR 1.2    BMP: Recent Labs    04/28/20 2035 04/29/20 0706 04/30/20 0507 05/01/20 0411  NA 135 141 141 141  K 5.0 4.8 4.4 3.3*  CL 98 101 106 106  CO2 26 30 27 25   GLUCOSE 186* 101* 96 99  BUN 32* 43* 16 8  CALCIUM 8.6* 8.5* 8.6* 8.4*  CREATININE 1.08* 0.83 0.67 0.63  GFRNONAA 54* >60 >60 >60  GFRAA >60 >60 >60 >60    LIVER FUNCTION TESTS: Recent Labs    04/28/20 2035  BILITOT 0.5  AST 15  ALT 11  ALKPHOS 86  PROT 7.7  ALBUMIN 3.0*    TUMOR MARKERS: No results for input(s): AFPTM, CEA, CA199, CHROMGRNA in the last 8760 hours.  Assessment and Plan:  Gastrointestinal bleeding: 2037, 63 year old female, was transported to [facility] Interventional Radiology department from [facility] for a mesenteric arteriogram with possible embolization. She will return to [facility] post-procedure.   The Risks and benefits of embolization were discussed with the patient including, but not limited to bleeding, infection, vascular injury, post operative pain, or contrast induced renal failure.  This procedure involves the use of X-rays and because of the nature of the planned procedure, it is possible that we will have prolonged use of X-ray fluoroscopy.  Potential radiation risks to you include (but are not limited to) the following: - A slightly elevated risk for cancer several years later in life. This risk is  typically less than 0.5% percent. This risk is low in comparison to the normal incidence of human cancer, which is 33% for women and 50% for men according to the American Cancer Society. - Radiation induced injury can include skin redness, resembling a rash, tissue breakdown / ulcers and hair loss (which can be temporary or permanent).  The likelihood of either of these occurring depends on the difficulty of the procedure and whether you are sensitive to radiation due to previous procedures, disease, or genetic conditions.  IF your procedure requires a prolonged use of radiation, you will be notified and given written instructions for further action. It is your responsibility to monitor the irradiated area for the 2 weeks following the procedure and to notify your physician if you are concerned that you have suffered a radiation induced injury.   All of the patient's questions and patient's daughters questions were answered, patient and daughter are agreeable to proceed.  Telephone consent obtained from the patient's daughter and is in the chart.   Thank you for this interesting consult.  I greatly enjoyed meeting Nicole Welch and look forward to participating in their care.  A copy of this report was sent to the requesting provider on this date.  Electronically Signed: Elizabeth Sauer, AGACNP-BC [phone] 05/01/2020, 3:51 PM   I spent a total of 40 Minutes    in face to face in clinical consultation, greater than 50% of which was counseling/coordinating care for mesenteric arteriogram with possible embolization.

## 2020-05-01 NOTE — Progress Notes (Addendum)
PROGRESS NOTE   Nicole Welch  FTD:[id number] DOB: [birth year] DOA: 04/28/2020 PCP: Elfredia Nevins, MD   Chief Complaint  Patient presents with  . Weakness   Brief Admission History:  65 y.o. female, medical history of anxiety, chronic back pain, osteoporosis, chronic diastolic CHF, went to ED secondary to complaints of generalized weakness and melena, patient reports overall generalized weakness and fatigue for last 3 days, she does report an episode of melena started yesterday, another episode this morning, she does report nausea and vomiting earlier today, reports is nonbilious(she did vomit purple gummy bears she ate earlier today), patient reports she takes Mobic occasionally, she last dose was before 2 days, she is on aspirin 81 mg oral daily chronically.  She was admitted with melena and suspected upper GI bleeding.  Assessment & Plan:   Active Problems:   Anxiety   Acute diastolic CHF (congestive heart failure) (HCC)   GI bleed   Upper GI bleeding   B12 deficiency   Gastric ulcer   Hypotension  1. Acute upper GI bleeding - Pt vomited dark red blood with clots shortly after arriving to floor after endoscopy this morning. RN called with low BP readings. I asked her to call rapid response and ordered fluid bolus.  I arrived to bedside and ordered 2 units emergency release PRBCs.  GI notified.  Ordered additional fluid boluses and second large bore IV.  BP didn't respond favorably and I ordered levophed infusion. BP slowly improved with aggressive treatments.  Blood arrived and transfused.  Pt started to feel better after blood was given. EKG, and heart monitoring was ordered and reviewed.  Worked with [facility] to arrange transfer to [facility] IR for angiogram and embolization.  I remained at bedside until patient had stabilized and [facility] team arrived to transport.  Orders given to [facility] regarding weaning levophed and desired MAP.   I updated IR team at [facility] about what happened and  that patient had stabilized and was on the way to [place].  I updated GI.  Current plan is for patient to return to [facility] after GI bleed was treated by IR. I had spoken to surgeon Dr. Lovell Sheehan earlier and he remains available if IR treatment fails but surgery is last resort.      2. Chronic diastolic CHF -  Lasix temporarily on hold.  3. GAD - resume home meds 4. Leukocytosis - WBC normalized.   5. Chronic blood loss anemia - EGD with findings of large bulbar ulcer that has continued to bleed. Transfer to [facility] as above for treatment by IR team.  I had spoken to Dr. Lovell Sheehan earlier and he is available if IR treatment fails.  6. Vitamin B12 deficiency - IM replacement 8/9, started oral replacement 8/10.   7. Hypotension - hold lasix today.    DVT prophylaxis:  SCDS Code Status:  Full  Family Communication: daughter at bedside 8/8 Disposition: home   Status is: INPATIENT  The patient will require care spanning > 2 midnights and should be moved to inpatient because: IV treatments appropriate due to intensity of illness or inability to take PO and Inpatient level of care appropriate due to severity of illness  Pt remains on IV protonix infusion for large bleeding bulbar ulcer.   Dispo: The patient is from: Home              Anticipated d/c is to: Home              Anticipated d/c  date is: 3 days              Patient currently is not medically stable to d/c.  Consultants:   GI   IR  Surgery   Procedures:   EGD 04/29/20  Antimicrobials:    Subjective: Pt spitting up blood clots, has some chest pressure and mild abdominal pain.    Objective: Vitals:   05/01/20 1130 05/01/20 1135 05/01/20 1200 05/01/20 1300  BP: (!) 100/48 (!) 101/53 (!) 72/54 (!) 57/19  Pulse: 94 98 (!) 114 (!) 118  Resp: 13 17 19    Temp:   98.5 F (36.9 C)   TempSrc:   Oral   SpO2: 100% 100% 100% 100%  Weight:      Height:        Intake/Output Summary (Last 24 hours) at 05/01/2020 1501 Last data filed at  05/01/2020 1101 Gross per 24 hour  Intake 600 ml  Output 200 ml  Net 400 ml   Filed Weights   04/28/20 1934 04/28/20 2350 05/01/20 0942  Weight: 77.1 kg 77 kg 77 kg    Examination:  General exam: Appears calm and comfortable  Respiratory system: Clear to auscultation. Respiratory effort normal. Cardiovascular system: S1 & S2 heard, RRR. No JVD, murmurs, rubs, gallops or clicks. No pedal edema. Gastrointestinal system: Abdomen is nondistended, soft and nontender. No organomegaly or masses felt. Normal bowel sounds heard. Central nervous system: Alert and oriented. No focal neurological deficits. Extremities: Symmetric 5 x 5 power. Skin: No rashes, lesions or ulcers Psychiatry: Judgement and insight appear normal. Mood & affect appropriate.   Data Reviewed: I have personally reviewed following labs and imaging studies  CBC: Recent Labs  Lab 04/28/20 2035 04/29/20 0706 04/30/20 0507 05/01/20 0411 05/01/20 1129  WBC 20.3* 17.7* 12.2* 9.2 10.5  NEUTROABS 18.3*  --   --   --   --   HGB 12.9 10.5* 9.1* 8.9* 8.1*  HCT 39.9 33.3* 29.2* 28.8* 25.2*  MCV 99.8 101.2* 103.5* 102.9* 102.0*  PLT 532* 469* 431* 461* 421*    Basic Metabolic Panel: Recent Labs  Lab 04/28/20 2035 04/29/20 0706 04/30/20 0507 05/01/20 0411  NA 135 141 141 141  K 5.0 4.8 4.4 3.3*  CL 98 101 106 106  CO2 26 30 27 25   GLUCOSE 186* 101* 96 99  BUN 32* 43* 16 8  CREATININE 1.08* 0.83 0.67 0.63  CALCIUM 8.6* 8.5* 8.6* 8.4*  MG  --   --  2.1 1.9    GFR: Estimated Creatinine Clearance: 69.8 mL/min (by C-G formula based on SCr of 0.63 mg/dL).  Liver Function Tests: Recent Labs  Lab 04/28/20 2035  AST 15  ALT 11  ALKPHOS 86  BILITOT 0.5  PROT 7.7  ALBUMIN 3.0*    CBG: No results for input(s): GLUCAP in the last 168 hours.  Recent Results (from the past 240 hour(s))  SARS Coronavirus 2 by RT PCR (hospital order, performed in [facility] lab) Nasopharyngeal Nasopharyngeal Swab      Status: None   Collection Time: 04/28/20  8:20 PM   Specimen: Nasopharyngeal Swab  Result Value Ref Range Status   SARS Coronavirus 2 NEGATIVE NEGATIVE Final    Comment: (NOTE) SARS-CoV-2 target nucleic acids are NOT DETECTED.  The SARS-CoV-2 RNA is generally detectable in upper and lower respiratory specimens during the acute phase of infection. The lowest concentration of SARS-CoV-2 viral copies this assay can detect is 250 copies / mL. A negative result does not  preclude SARS-CoV-2 infection and should not be used as the sole basis for treatment or other patient management decisions.  A negative result may occur with improper specimen collection / handling, submission of specimen other than nasopharyngeal swab, presence of viral mutation(s) within the areas targeted by this assay, and inadequate number of viral copies (<250 copies / mL). A negative result must be combined with clinical observations, patient history, and epidemiological information.  Fact Sheet for Patients:   BoilerBrush.com.cy  Fact Sheet for Healthcare Providers: https://pope.com/  This test is not yet approved or  cleared by the [place] FDA and has been authorized for detection and/or diagnosis of SARS-CoV-2 by FDA under an Emergency Use Authorization (EUA).  This EUA will remain in effect (meaning this test can be used) for the duration of the COVID-19 declaration under Section 564(b)(1) of the Act, 21 U.S.C. section 360bbb-3(b)(1), unless the authorization is terminated or revoked sooner.  Performed at [facility], [address] [place], [place] [address]   Urine culture     Status: Abnormal   Collection Time: 04/28/20  9:31 PM   Specimen: Urine, Clean Catch  Result Value Ref Range Status   Specimen Description   Final    URINE, CLEAN CATCH Performed at [facility], [address], [place], [place] [address]    Special Requests   Final     NONE Performed at [facility], [address], [place], [place] [address]    Culture (A)  Final    <10,000 COLONIES/mL INSIGNIFICANT GROWTH Performed at [facility], [address] [place], [place] [address]    Report Status 04/30/2020 FINAL  Final    Radiology Studies: No results found. Scheduled Meds: . sodium chloride   Intravenous Once  . calcium-vitamin D  1 tablet Oral Daily  . furosemide  20 mg Oral Daily  . glycopyrrolate      . lidocaine      . nicotine  7 mg Transdermal Daily  . [START ON 05/02/2020] pantoprazole  40 mg Intravenous Q12H  . phenylephrine      . phenylephrine      . potassium chloride  30 mEq Oral Daily  . simvastatin  40 mg Oral QHS  . sucralfate  1 g Oral TID WC & HS  . vitamin B-12  1,000 mcg Oral Daily   Continuous Infusions: . sodium chloride 10 mL/hr at 04/30/20 0819  . norepinephrine    . norepinephrine (LEVOPHED) Adult infusion    . pantoprozole (PROTONIX) infusion 8 mg/hr (05/01/20 0431)  . sodium chloride       LOS: 2 days   Critical Care Procedure Note Authorized and Performed by: Maryln Manuel MD  Total Critical Care time:  70 mins Due to a high probability of clinically significant, life threatening deterioration, the patient required my highest level of preparedness to intervene emergently and I personally spent this critical care time directly and personally managing the patient.  This critical care time included obtaining a history; examining the patient, pulse oximetry; ordering and review of studies; arranging urgent treatment with development of a management plan; evaluation of patient's response of treatment; frequent reassessment; and discussions with other providers.  This critical care time was performed to assess and manage the high probability of imminent and life threatening deterioration that could result in multi-organ failure.  It was exclusive of separately billable procedures and treating other patients and teaching  time.    Standley Dakins, MD How to contact the  [facility] Attending or Consulting provider 7A - 7P or covering provider during after hours 7P -7A, for this patient?  1. Check the care team in [facility] and look for a) attending/consulting [facility] provider listed and b) the [facility] team listed 2. Log into [email or web address] and use [place]'s universal password to access. If you do not have the password, please contact the hospital operator. 3. Locate the [facility] provider you are looking for under [facility] and page to a number that you can be directly reached. 4. If you still have difficulty reaching the provider, please page the [facility] (Director on Call) for the Hospitalists listed on amion for assistance.  05/01/2020, 3:01 PM

## 2020-05-01 NOTE — Brief Op Note (Addendum)
EGD report  Patient had a large clot in her gastric chamber coming from the duodenum.  I used cold snare to cath the clot in the proximal two thirds of the clot to adequately visualize the gastric outlet.  There was presence of a large clot adhered to the duodenal anterolateral surface which could not be removed with adequate Larach.  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  Despite washing multiple times the site of active bleeding could not be observed and no attempt to remove the clot could be performed.  The procedure was finished at this time.   Recommendations - Transfer patient to stepdown/telemetry bed. - NPO.  - Obtain T/S and CBC STAT - Repeat CBC q6h, transfuse if Hb <8 - Pantoprazole ggt/ - 2 large bore IV lines - Active T/S - Avoid NSAIDs - Transfer for IR embolization of bleeding vessel.  Katrinka Blazing, MD Gastroenterology and Hepatology [facility]

## 2020-05-02 ENCOUNTER — Encounter (HOSPITAL_COMMUNITY): Payer: Self-pay | Admitting: Radiology

## 2020-05-02 ENCOUNTER — Inpatient Hospital Stay (HOSPITAL_COMMUNITY): Payer: Medicare HMO

## 2020-05-02 DIAGNOSIS — K2970 Gastritis, unspecified, without bleeding: Secondary | ICD-10-CM

## 2020-05-02 DIAGNOSIS — K449 Diaphragmatic hernia without obstruction or gangrene: Secondary | ICD-10-CM

## 2020-05-02 DIAGNOSIS — K264 Chronic or unspecified duodenal ulcer with hemorrhage: Secondary | ICD-10-CM

## 2020-05-02 DIAGNOSIS — K3189 Other diseases of stomach and duodenum: Secondary | ICD-10-CM

## 2020-05-02 DIAGNOSIS — K922 Gastrointestinal hemorrhage, unspecified: Secondary | ICD-10-CM | POA: Diagnosis not present

## 2020-05-02 DIAGNOSIS — K921 Melena: Secondary | ICD-10-CM

## 2020-05-02 LAB — SEDIMENTATION RATE: Sed Rate: 10 mm/hr (ref 0–22)

## 2020-05-02 LAB — CBC
HCT: 28.3 % — ABNORMAL LOW (ref 36.0–46.0)
Hemoglobin: 9.5 g/dL — ABNORMAL LOW (ref 12.0–15.0)
MCH: 30.5 pg (ref 26.0–34.0)
MCHC: 33.6 g/dL (ref 30.0–36.0)
MCV: 91 fL (ref 80.0–100.0)
Platelets: 189 10*3/uL (ref 150–400)
RBC: 3.11 MIL/uL — ABNORMAL LOW (ref 3.87–5.11)
RDW: 15.9 % — ABNORMAL HIGH (ref 11.5–15.5)
WBC: 19.1 10*3/uL — ABNORMAL HIGH (ref 4.0–10.5)
nRBC: 0 % (ref 0.0–0.2)

## 2020-05-02 LAB — URINALYSIS, ROUTINE W REFLEX MICROSCOPIC
Bilirubin Urine: NEGATIVE
Glucose, UA: NEGATIVE mg/dL
Hgb urine dipstick: NEGATIVE
Ketones, ur: NEGATIVE mg/dL
Leukocytes,Ua: NEGATIVE
Nitrite: NEGATIVE
Protein, ur: NEGATIVE mg/dL
Specific Gravity, Urine: 1.015 (ref 1.005–1.030)
pH: 5.5 (ref 5.0–8.0)

## 2020-05-02 LAB — BASIC METABOLIC PANEL
Anion gap: 8 (ref 5–15)
BUN: 20 mg/dL (ref 8–23)
CO2: 23 mmol/L (ref 22–32)
Calcium: 7.5 mg/dL — ABNORMAL LOW (ref 8.9–10.3)
Chloride: 107 mmol/L (ref 98–111)
Creatinine, Ser: 0.53 mg/dL (ref 0.44–1.00)
GFR calc Af Amer: >60 mL/min (ref >60)
GFR calc non Af Amer: >60 mL/min (ref >60)
Glucose, Bld: 114 mg/dL — ABNORMAL HIGH (ref 70–99)
Potassium: 3.6 mmol/L (ref 3.5–5.1)
Sodium: 138 mmol/L (ref 135–145)

## 2020-05-02 LAB — PROCALCITONIN: Procalcitonin: <0.1 ng/mL

## 2020-05-02 LAB — BLOOD PRODUCT ORDER (VERBAL) VERIFICATION

## 2020-05-02 LAB — MAGNESIUM: Magnesium: 1.4 mg/dL — ABNORMAL LOW (ref 1.7–2.4)

## 2020-05-02 LAB — C-REACTIVE PROTEIN: CRP: 1.6 mg/dL — ABNORMAL HIGH (ref <1.0)

## 2020-05-02 MED ORDER — POTASSIUM CHLORIDE 20 MEQ/15ML (10%) PO SOLN
30.0000 meq | Freq: Every day | ORAL | Status: DC
  Administered 2020-05-02 – 2020-05-05 (×4): 30 meq via ORAL
  Filled 2020-05-02 (×4): qty 30

## 2020-05-02 MED ORDER — MAGNESIUM SULFATE 2 GM/50ML IV SOLN
2.0000 g | Freq: Once | INTRAVENOUS | Status: AC
  Administered 2020-05-02: 2 g via INTRAVENOUS
  Filled 2020-05-02: qty 50

## 2020-05-02 NOTE — Progress Notes (Signed)
GI Inpatient Follow-up Note  Patient Identification: Nicole Welch is a 64 y.o. female with medical history back pain (chronic use NSAIDS), CHF admitted on 04/29/20 for melena and progressive weakness.  Hemoglobin at baseline was 16, on admission Hgb was 10.5. Had endoscopy 04/29/20 showing a large duodenal ulcer with adherent clot, elected not to proceed with therapeutic intervention given risk and location. She had a repeat endoscopy on 05/01/2020 due to drop in H/h with active oozing coming from the fistulous tract of the pylorus, following this endoscopy she was transferred to [facility] to have mesenteric angiogram.   Subjective: Patient reports having discomfort with her rectal tube which was removed early this AM.  She has not had any further stooling since rectal tube removed this morning. Last night output was dark maroon.  She denies any abdominal pain, n/v. She had some water but has not had any other liquids since procedure yesterday.   Scheduled Inpatient Medications:  . sodium chloride   Intravenous Once  . sodium chloride   Intravenous Once  . calcium-vitamin D  1 tablet Oral Daily  . Chlorhexidine Gluconate Cloth  6 each Topical Daily  . nicotine  7 mg Transdermal Daily  . pantoprazole  40 mg Intravenous Q12H  . potassium chloride  30 mEq Oral Daily  . simvastatin  40 mg Oral QHS  . sucralfate  1 g Oral TID WC & HS  . vitamin B-12  1,000 mcg Oral Daily    Continuous Inpatient Infusions:   . sodium chloride 10 mL/hr at 05/01/20 2127  . acetaminophen    . magnesium sulfate bolus IVPB 2 g (05/02/20 0755)  . norepinephrine (LEVOPHED) Adult infusion Stopped (05/01/20 1840)    PRN Inpatient Medications:  acetaminophen, acetaminophen **OR** acetaminophen, ALPRAZolam, cyclobenzaprine, fentaNYL (SUBLIMAZE) injection, HYDROcodone-acetaminophen, ondansetron **OR** ondansetron (ZOFRAN) IV, ondansetron (ZOFRAN) IV, oxyCODONE-acetaminophen  Review of Systems: Constitutional: Weight  is stable.  Eyes: No changes in vision. ENT: No oral lesions, sore throat.  GI: see HPI.  Heme/Lymph: No easy bruising.  CV: No chest pain.  GU: No hematuria.  Integumentary: No rashes.  Neuro: No headaches.  Psych: No depression/anxiety.  Endocrine: No heat/cold intolerance.  Allergic/Immunologic: No urticaria.  Resp: No cough, SOB.  Musculoskeletal: No joint swelling.    Physical Examination: BP (!) 112/42   Pulse 86   Temp 98 F (36.7 C) (Oral)   Resp 16   Ht 5\' 3"  (1.6 m)   Wt 77 kg   SpO2 97%   BMI 30.07 kg/m  Gen: NAD, alert and oriented x 4. Up to bedside toilet during exam.  HEENT: PEERLA, EOMI, Neck: supple, no JVD or thyromegaly Chest: CTA bilaterally, no wheezes, crackles, or other adventitious sounds CV: RRR, no m/g/c/r Abd: soft, NT, ND, +BS in all four quadrants; no HSM, guarding, ridigity, or rebound tenderness Ext: no edema, well perfused with 2+ pulses, Skin: no rash or lesions noted Lymph: no LAD  Data: Lab Results  Component Value Date   WBC 19.1 (H) 05/02/2020   HGB 9.5 (L) 05/02/2020   HCT 28.3 (L) 05/02/2020   MCV 91.0 05/02/2020   PLT 189 05/02/2020   Recent Labs  Lab 05/01/20 1811 05/01/20 1838 05/02/20 0426  HGB 10.9* 9.9* 9.5*   Lab Results  Component Value Date   NA 138 05/02/2020   K 3.6 05/02/2020   CL 107 05/02/2020   CO2 23 05/02/2020   BUN 20 05/02/2020   CREATININE 0.53 05/02/2020   Lab Results  Component Value Date   ALT 11 04/28/2020   AST 15 04/28/2020   ALKPHOS 86 04/28/2020   BILITOT 0.5 04/28/2020   Recent Labs  Lab 04/29/20 0706  INR 1.2    EGD yesterday -  Patient had a large clot in her gastric chamber coming from the duodenum.  I used cold snare to cath the clot in the proximal two thirds of the clot to adequately visualize the gastric outlet.  There was presence of a large clot adhered to the duodenal anterolateral surface which could not be removed with adequate Larach.  Initially there was no  presence of active bleeding but upon withdrawal the scope there was visualization of active oozing coming from the fistulous tract of the double pylorus.  Despite washing multiple times the site of active bleeding could not be observed and no attempt to remove the clot could be performed.     Assessment/Plan: [name] is a 64 y.o. female    1. GI Bleed - admission Hgb was 12.9, trended down to 7.1 yesterday and had repeat EGD with ultimately interventional radiology intervention yesterday.  Morning hemoglobin was stable at 9.5 but clinically bleeding has resolved.  Will advance to clear liquid diet.  H. pylori serologies were positive and will need treatment at some point. Continue H/h monitoring. WBC down from admission but still elevated (blood cultures pending). Cr normal.    Case discussed w/ Dr Karilyn Cota who will see patient w/ further recommendations.   Please call with questions or concerns.    Bluford Kaufmann, PA-C [facility]

## 2020-05-02 NOTE — Progress Notes (Signed)
  IR round note Via phone   IR procedure 05/01/20 Procedure:  Mesenteric arteriogram, coil embo gastroduodenal artery   [id number] Complications: none immediate  Spoke to RN via  Phone Pt has been extubated Off pressors Talking to nurse and those in room No additional transfusions  BP 106/41; HR 88; R 15; 98.1 Rt groin site is clean and dry No bleeding No hematoma Rt foot has good pulses Hg 9.5  We will keep on IR Radar for few days

## 2020-05-02 NOTE — Progress Notes (Signed)
PROGRESS NOTE    Nicole Welch  LSL:[id number] DOB: [birth year] DOA: 04/28/2020 PCP: Redmond School, MD   Brief Narrative:  64 y.o.female,medical history of anxiety, chronic back pain, osteoporosis, chronic diastolic CHF, went to ED secondary to complaints of generalized weakness and melena, patient reports overall generalized weakness and fatigue for last 3 days, she does report an episode of melena started yesterday, another episode this morning, she does report nausea and vomiting earlier today, reports is nonbilious(she did vomit purple gummy bears she ate earlier today),patient reports she takes Mobic occasionally, she last dose was before 2 days, she is on aspirin 81 mg oral daily chronically.  She was admitted with melena and suspected upper GI bleeding.  8/11: Patient noted to have a large clot in her gastric chamber coming from the duodenum on EGD on 8/10.  Recommendations were for Protonix drip and transfer for IR embolization of bleeding vessel.  Patient subsequently transferred to [facility] for mesenteric arteriogram and coil embolization of gastroduodenal artery performed by IR and then transferred back to [facility].  Assessment & Plan:   Active Problems:   Anxiety   Acute diastolic CHF (congestive heart failure) (HCC)   GI bleed   Upper GI bleeding   B12 deficiency   Gastric ulcer   Hypotension   Acute blood loss anemia secondary to acute upper GI bleed -Status post EGD 8/10 as well as mesenteric arteriogram with coil embolization to gastroduodenal artery on 8/10 by IR -Continue PPI IV twice daily -Diet advanced to clear liquid on 8/11 -Appreciate ongoing GI recommendations  Leukocytosis -Upward trend noted with low procalcitonin -Blood cultures obtained and pending -Chest x-ray and urine analysis without acute findings -Continue monitor repeat CBC as well as CRP and ESR  Chronic diastolic CHF -With noted soft blood pressure readings and overall  euvolemia -Lasix temporarily on hold until dietary intake stabilizes as well as blood pressure -She is currently with +5 L fluid balance -May require some IV Lasix once blood pressures improve and this will also assist with weaning of oxygen  GAD -Continue on Xanax  Lumbago -Continue home Norco and up to chair -Fentanyl as needed -Eventual ambulation as tolerated  Dyslipidemia -Continue statin and hold ezetimibe for now  Vitamin B12 deficiency -Continue B12 supplementation  DVT prophylaxis: SCDs Code Status: Full code Family Communication: Tried calling daughter with no response 8/11. Disposition Plan:   Status is: Inpatient  Remains inpatient appropriate because:Unsafe d/c plan, IV treatments appropriate due to intensity of illness or inability to take PO and Inpatient level of care appropriate due to severity of illness   Dispo: The patient is from: Home              Anticipated d/c is to: Home              Anticipated d/c date is: 2 days              Patient currently is not medically stable to d/c.  Patient requires close monitoring on account of recent blood loss anemia and coil embolization.  Consultants:   GI  IR  General surgery  Procedures:   EGD 8/10  Mesenteric arteriogram with coil embolization to gastroduodenal artery 8/10  Antimicrobials:   None   Subjective: Patient seen and evaluated today with complaints of low back pain and states that she would like to set up.  No overt bleeding noted overnight.  Patient denies any nausea, vomiting, or abdominal pain.    Objective:  Vitals:   05/02/20 0700 05/02/20 0800 05/02/20 0900 05/02/20 1000  BP: (!) 96/35 (!) 111/45 (!) 107/39 (!) 106/41  Pulse: 85 89 88 89  Resp: 18 18 19 15   Temp:   98.1 F (36.7 C)   TempSrc:   Oral   SpO2: 96% 98% 96% 95%  Weight:      Height:        Intake/Output Summary (Last 24 hours) at 05/02/2020 1259 Last data filed at 05/02/2020 1000 Gross per 24 hour  Intake  2962.58 ml  Output 1900 ml  Net 1062.58 ml   Filed Weights   04/28/20 1934 04/28/20 2350 05/01/20 0942  Weight: 77.1 kg 77 kg 77 kg    Examination:  General exam: Appears calm and comfortable  Respiratory system: Clear to auscultation. Respiratory effort normal.  Currently on 2 L nasal cannula oxygen. Cardiovascular system: S1 & S2 heard, RRR. No JVD, murmurs, rubs, gallops or clicks. No pedal edema. Gastrointestinal system: Abdomen is nondistended, soft and nontender. No organomegaly or masses felt. Normal bowel sounds heard. Central nervous system: Alert and oriented. No focal neurological deficits. Extremities: Symmetric 5 x 5 power. Skin: No rashes, lesions or ulcers Psychiatry: Judgement and insight appear normal. Mood & affect appropriate.     Data Reviewed: I have personally reviewed following labs and imaging studies  CBC: Recent Labs  Lab 04/28/20 2035 04/28/20 2035 04/29/20 0706 04/29/20 0706 04/30/20 0507 04/30/20 0507 05/01/20 0411 05/01/20 0411 05/01/20 1129 05/01/20 1631 05/01/20 1811 05/01/20 1838 05/02/20 0426  WBC 20.3*   < > 17.7*  --  12.2*  --  9.2  --  10.5  --   --   --  19.1*  NEUTROABS 18.3*  --   --   --   --   --   --   --   --   --   --   --   --   HGB 12.9   < > 10.5*   < > 9.1*   < > 8.9*   < > 8.1* 7.1* 10.9* 9.9* 9.5*  HCT 39.9   < > 33.3*   < > 29.2*   < > 28.8*   < > 25.2* 21.0* 32.0* 29.0* 28.3*  MCV 99.8   < > 101.2*  --  103.5*  --  102.9*  --  102.0*  --   --   --  91.0  PLT 532*   < > 469*  --  431*  --  461*  --  421*  --   --   --  189   < > = values in this interval not displayed.   Basic Metabolic Panel: Recent Labs  Lab 04/28/20 2035 04/28/20 2035 04/29/20 0706 04/29/20 0706 04/30/20 0507 04/30/20 0507 05/01/20 0411 05/01/20 1631 05/01/20 1811 05/01/20 1838 05/02/20 0426  NA 135   < > 141   < > 141   < > 141 141 143 143 138  K 5.0   < > 4.8   < > 4.4   < > 3.3* 4.5 4.6 4.4 3.6  CL 98   < > 101  --  106  --  106  104  --   --  107  CO2 26  --  30  --  27  --  25  --   --   --  23  GLUCOSE 186*   < > 101*  --  96  --  99 135*  --   --  114*  BUN 32*   < > 43*  --  16  --  8 10  --   --  20  CREATININE 1.08*   < > 0.83  --  0.67  --  0.63 0.50  --   --  0.53  CALCIUM 8.6*  --  8.5*  --  8.6*  --  8.4*  --   --   --  7.5*  MG  --   --   --   --  2.1  --  1.9  --   --   --  1.4*   < > = values in this interval not displayed.   GFR: Estimated Creatinine Clearance: 69.8 mL/min (by C-G formula based on SCr of 0.53 mg/dL). Liver Function Tests: Recent Labs  Lab 04/28/20 2035  AST 15  ALT 11  ALKPHOS 86  BILITOT 0.5  PROT 7.7  ALBUMIN 3.0*   No results for input(s): LIPASE, AMYLASE in the last 168 hours. No results for input(s): AMMONIA in the last 168 hours. Coagulation Profile: Recent Labs  Lab 04/29/20 0706  INR 1.2   Cardiac Enzymes: No results for input(s): CKTOTAL, CKMB, CKMBINDEX, TROPONINI in the last 168 hours. BNP (last 3 results) No results for input(s): PROBNP in the last 8760 hours. HbA1C: No results for input(s): HGBA1C in the last 72 hours. CBG: No results for input(s): GLUCAP in the last 168 hours. Lipid Profile: No results for input(s): CHOL, HDL, LDLCALC, TRIG, CHOLHDL, LDLDIRECT in the last 72 hours. Thyroid Function Tests: No results for input(s): TSH, T4TOTAL, FREET4, T3FREE, THYROIDAB in the last 72 hours. Anemia Panel: Recent Labs    04/29/20 1349  VITAMINB12 101*  FOLATE 9.7  FERRITIN 65  TIBC 247*  IRON 29  RETICCTPCT 2.8   Sepsis Labs: Recent Labs  Lab 04/29/20 0706 04/29/20 0915 05/02/20 0832  PROCALCITON  --   --  <0.10  LATICACIDVEN 1.2 1.7  --     Recent Results (from the past 240 hour(s))  SARS Coronavirus 2 by RT PCR (hospital order, performed in [facility] hospital lab) Nasopharyngeal Nasopharyngeal Swab     Status: None   Collection Time: 04/28/20  8:20 PM   Specimen: Nasopharyngeal Swab  Result Value Ref Range Status   SARS  Coronavirus 2 NEGATIVE NEGATIVE Final    Comment: (NOTE) SARS-CoV-2 target nucleic acids are NOT DETECTED.  The SARS-CoV-2 RNA is generally detectable in upper and lower respiratory specimens during the acute phase of infection. The lowest concentration of SARS-CoV-2 viral copies this assay can detect is 250 copies / mL. A negative result does not preclude SARS-CoV-2 infection and should not be used as the sole basis for treatment or other patient management decisions.  A negative result may occur with improper specimen collection / handling, submission of specimen other than nasopharyngeal swab, presence of viral mutation(s) within the areas targeted by this assay, and inadequate number of viral copies (<250 copies / mL). A negative result must be combined with clinical observations, patient history, and epidemiological information.  Fact Sheet for Patients:   StrictlyIdeas.no  Fact Sheet for Healthcare Providers: BankingDealers.co.za  This test is not yet approved or  cleared by the [place] FDA and has been authorized for detection and/or diagnosis of SARS-CoV-2 by FDA under an Emergency Use Authorization (EUA).  This EUA will remain in effect (meaning this test can be used) for the duration of the COVID-19 declaration under Section 564(b)(1) of the Act, 21 U.S.C. section  360bbb-3(b)(1), unless the authorization is terminated or revoked sooner.  Performed at [facility], [address], [place], [place] [address]   Urine culture     Status: Abnormal   Collection Time: 04/28/20  9:31 PM   Specimen: Urine, Clean Catch  Result Value Ref Range Status   Specimen Description   Final    URINE, CLEAN CATCH Performed at [facility], [address], [place], [place] [address]    Special Requests   Final    NONE Performed at [facility], [address], [place], [place] [address]    Culture (A)  Final    <10,000 COLONIES/mL  INSIGNIFICANT GROWTH Performed at [facility], [place] [address] [place], [place] [address]    Report Status 04/30/2020 FINAL  Final  Culture, blood (routine x 2)     Status: None (Preliminary result)   Collection Time: 05/02/20  8:32 AM   Specimen: BLOOD RIGHT HAND  Result Value Ref Range Status   Specimen Description   Final    BLOOD RIGHT HAND BOTTLES DRAWN AEROBIC AND ANAEROBIC   Special Requests   Final    Blood Culture adequate volume Performed at [facility], [address] [place], [place] [address]    Culture PENDING  Incomplete   Report Status PENDING  Incomplete  Culture, blood (routine x 2)     Status: None (Preliminary result)   Collection Time: 05/02/20  8:36 AM   Specimen: BLOOD RIGHT HAND  Result Value Ref Range Status   Specimen Description   Final    BLOOD RIGHT HAND BOTTLES DRAWN AEROBIC AND ANAEROBIC   Special Requests   Final    Blood Culture adequate volume Performed at [facility], [address], [place], [place] [address]    Culture PENDING  Incomplete   Report Status PENDING  Incomplete         Radiology Studies: IR Aortagram Abdominal Serialogram  Result Date: 05/02/2020 INDICATION: Duodenal ulcer, bleeding, incomplete control with endoscopic maneuvers. hypotensive requiring pressors. EXAM: SELECTIVE MESENTERIC ARTERIOGRAM FLUSH ABDOMINAL AORTOGRAPHY COIL EMBOLIZATION OF GASTRODUODENAL ARTERY ULTRASOUND FOR VASCULAR ACCESS MEDICATIONS: no antibiotics indicated ANESTHESIA/SEDATION: General anesthetic by department of anesthesia PROCEDURE: Informed consent was obtained from the patient following explanation of the procedure, risks, benefits and alternatives. The patient understands, agrees and consents for the procedure. All questions were addressed. General anaesthesia was induced. A time out was performed prior to the initiation of the procedure. Maximal barrier sterile technique utilized including caps, mask, sterile gowns, sterile gloves, large  sterile drape, hand hygiene, and Betadine prep. Patency of the right common femoral artery confirmed with ultrasound documentation. Under real-time ultrasound guidance, the vessel was accessed with a 21-gauge micropuncture needle, exchanged over a 018 guidewire for a transitional dilator, through which a 035 guidewire was advanced. 5 [place] vascular sheath placed. 5 French C2 catheter advanced in the SMA selectively catheterized for selective arteriography. Subsequently the celiac axis was selectively catheterized for arteriography. Additional views of the SMA within obtained. The C2 was exchanged for a pigtail catheter for flush aortography. Having confirmed visceral anatomy, the C2 was then advanced into the SMA for additional arteriography in variety of projections, and subsequently passage of coaxial Renegade microcatheter in attempts to negotiate collateral supply to the celiac axis and GDA. After a variety of microcatheter and guidewire combinations were attempted, celiac access was again catheterized with a C2 and coaxial microcatheter advanced into the gastroduodenal artery for sub selective arteriography. The vessel was then embolized with  2, 3, and 4 mm overlapping interlock coils with cessation of flow in the segment. After final confirmatory arteriogram, microcatheter and CT were removed. After confirmatory common femoral arteriogram, the sheath was removed and hemostasis achieved with the Exoseal device. The patient tolerated the procedure well. FINDINGS: Superior mesenteric arteriogram demonstrated patency of the trunk, replaced right hepatic arterial supply (anatomic variant), and well-developed collateral supply to the celiac axis distribution. Celiac arteriography demonstrated high-grade origin stenosis with moderate atheromatous irregularity distally in the celiac axis, classic trifurcation anatomy. Selective gastroduodenal arteriography demonstrates branch vessels pseudoaneurysm or contained  extravasation. After coil embolization, there is durable closure of the treated segment of the GDA, nonvisualization of the vascular lesion, no evident complication. FLUOROSCOPY TIME:  46 minutes 30 seconds; 3785 mGy COMPLICATIONS: None immediate. IMPRESSION: 1. 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 2. Technically successful coil embolization of gastroduodenal artery. 3. 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 4. Replaced right hepatic arterial supply from the SMA, an anatomic variant. Electronically Signed   By: Lucrezia Europe M.D.   On: 05/02/2020 12:12   IR Angiogram Visceral Selective  Result Date: 05/02/2020 INDICATION: Duodenal ulcer, bleeding, incomplete control with endoscopic maneuvers. hypotensive requiring pressors. EXAM: SELECTIVE MESENTERIC ARTERIOGRAM FLUSH ABDOMINAL AORTOGRAPHY COIL EMBOLIZATION OF GASTRODUODENAL ARTERY ULTRASOUND FOR VASCULAR ACCESS MEDICATIONS: no antibiotics indicated ANESTHESIA/SEDATION: General anesthetic by department of anesthesia PROCEDURE: Informed consent was obtained from the patient following explanation of the procedure, risks, benefits and alternatives. The patient understands, agrees and consents for the procedure. All questions were addressed. General anaesthesia was induced. A time out was performed prior to the initiation of the procedure. Maximal barrier sterile technique utilized including caps, mask, sterile gowns, sterile gloves, large sterile drape, hand hygiene, and Betadine prep. Patency of the right common femoral artery confirmed with ultrasound documentation. Under real-time ultrasound guidance, the vessel was accessed with a 21-gauge micropuncture needle, exchanged over a 018 guidewire for a transitional dilator, through which a 035 guidewire was advanced. 5 [place] vascular sheath placed. 5 French C2 catheter  advanced in the SMA selectively catheterized for selective arteriography. Subsequently the celiac axis was selectively catheterized for arteriography. Additional views of the SMA within obtained. The C2 was exchanged for a pigtail catheter for flush aortography. Having confirmed visceral anatomy, the C2 was then advanced into the SMA for additional arteriography in variety of projections, and subsequently passage of coaxial Renegade microcatheter in attempts to negotiate collateral supply to the celiac axis and GDA. After a variety of microcatheter and guidewire combinations were attempted, celiac access was again catheterized with a C2 and coaxial microcatheter advanced into the gastroduodenal artery for sub selective arteriography. The vessel was then embolized with 2, 3, and 4 mm overlapping interlock coils with cessation of flow in the segment. After final confirmatory arteriogram, microcatheter and CT were removed. After confirmatory common femoral arteriogram, the sheath was removed and hemostasis achieved with the Exoseal device. The patient tolerated the procedure well. FINDINGS: Superior mesenteric arteriogram demonstrated patency of the trunk, replaced right hepatic arterial supply (anatomic variant), and well-developed collateral supply to the celiac axis distribution. Celiac arteriography demonstrated high-grade origin stenosis with moderate atheromatous irregularity distally in the celiac axis, classic trifurcation anatomy. 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 After coil embolization, there is durable closure of the treated segment of the GDA, nonvisualization of the vascular lesion, no evident complication. FLUOROSCOPY TIME:  46  minutes 30 seconds; 3149 mGy COMPLICATIONS: None immediate. IMPRESSION: 1. 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 2. Technically  successful coil embolization of gastroduodenal artery. 3. 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 4. Replaced right hepatic arterial supply from the SMA, an anatomic variant. Electronically Signed   By: Lucrezia Europe M.D.   On: 05/02/2020 12:12   IR Angiogram Visceral Selective  Result Date: 05/02/2020 INDICATION: Duodenal ulcer, bleeding, incomplete control with endoscopic maneuvers. hypotensive requiring pressors. EXAM: SELECTIVE MESENTERIC ARTERIOGRAM FLUSH ABDOMINAL AORTOGRAPHY COIL EMBOLIZATION OF GASTRODUODENAL ARTERY ULTRASOUND FOR VASCULAR ACCESS MEDICATIONS: no antibiotics indicated ANESTHESIA/SEDATION: General anesthetic by department of anesthesia PROCEDURE: Informed consent was obtained from the patient following explanation of the procedure, risks, benefits and alternatives. The patient understands, agrees and consents for the procedure. All questions were addressed. General anaesthesia was induced. A time out was performed prior to the initiation of the procedure. Maximal barrier sterile technique utilized including caps, mask, sterile gowns, sterile gloves, large sterile drape, hand hygiene, and Betadine prep. Patency of the right common femoral artery confirmed with ultrasound documentation. Under real-time ultrasound guidance, the vessel was accessed with a 21-gauge micropuncture needle, exchanged over a 018 guidewire for a transitional dilator, through which a 035 guidewire was advanced. 5 [place] vascular sheath placed. 5 French C2 catheter advanced in the SMA selectively catheterized for selective arteriography. Subsequently the celiac axis was selectively catheterized for arteriography. Additional views of the SMA within obtained. The C2 was exchanged for a pigtail catheter for flush aortography. Having confirmed visceral anatomy, the C2 was then advanced into the SMA for additional arteriography in variety of projections,  and subsequently passage of coaxial Renegade microcatheter in attempts to negotiate collateral supply to the celiac axis and GDA. After a variety of microcatheter and guidewire combinations were attempted, celiac access was again catheterized with a C2 and coaxial microcatheter advanced into the gastroduodenal artery for sub selective arteriography. The vessel was then embolized with 2, 3, and 4 mm overlapping interlock coils with cessation of flow in the segment. After final confirmatory arteriogram, microcatheter and CT were removed. After confirmatory common femoral arteriogram, the sheath was removed and hemostasis achieved with the Exoseal device. The patient tolerated the procedure well. FINDINGS: Superior mesenteric arteriogram demonstrated patency of the trunk, replaced right hepatic arterial supply (anatomic variant), and well-developed collateral supply to the celiac axis distribution. Celiac arteriography demonstrated high-grade origin stenosis with moderate atheromatous irregularity distally in the celiac axis, classic trifurcation anatomy. 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 After coil embolization, there is durable closure of the treated segment of the GDA, nonvisualization of the vascular lesion, no evident complication. FLUOROSCOPY TIME:  46 minutes 30 seconds; 7026 mGy COMPLICATIONS: None immediate. IMPRESSION: 1. 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 2. Technically successful coil embolization of gastroduodenal artery. 3. 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 4. Replaced right hepatic arterial supply from the SMA, an anatomic variant. Electronically Signed   By: Lucrezia Europe M.D.   On: 05/02/2020 12:12   IR Angiogram Selective Each Additional Vessel  Result Date:  05/02/2020 INDICATION: Duodenal ulcer, bleeding, incomplete control with endoscopic maneuvers. hypotensive requiring pressors. EXAM: SELECTIVE MESENTERIC ARTERIOGRAM FLUSH ABDOMINAL AORTOGRAPHY COIL EMBOLIZATION OF GASTRODUODENAL ARTERY ULTRASOUND FOR VASCULAR ACCESS MEDICATIONS: no antibiotics indicated ANESTHESIA/SEDATION: General anesthetic by department of  anesthesia PROCEDURE: Informed consent was obtained from the patient following explanation of the procedure, risks, benefits and alternatives. The patient understands, agrees and consents for the procedure. All questions were addressed. General anaesthesia was induced. A time out was performed prior to the initiation of the procedure. Maximal barrier sterile technique utilized including caps, mask, sterile gowns, sterile gloves, large sterile drape, hand hygiene, and Betadine prep. Patency of the right common femoral artery confirmed with ultrasound documentation. Under real-time ultrasound guidance, the vessel was accessed with a 21-gauge micropuncture needle, exchanged over a 018 guidewire for a transitional dilator, through which a 035 guidewire was advanced. 5 [place] vascular sheath placed. 5 French C2 catheter advanced in the SMA selectively catheterized for selective arteriography. Subsequently the celiac axis was selectively catheterized for arteriography. Additional views of the SMA within obtained. The C2 was exchanged for a pigtail catheter for flush aortography. Having confirmed visceral anatomy, the C2 was then advanced into the SMA for additional arteriography in variety of projections, and subsequently passage of coaxial Renegade microcatheter in attempts to negotiate collateral supply to the celiac axis and GDA. After a variety of microcatheter and guidewire combinations were attempted, celiac access was again catheterized with a C2 and coaxial microcatheter advanced into the gastroduodenal artery for sub selective arteriography. The vessel was  then embolized with 2, 3, and 4 mm overlapping interlock coils with cessation of flow in the segment. After final confirmatory arteriogram, microcatheter and CT were removed. After confirmatory common femoral arteriogram, the sheath was removed and hemostasis achieved with the Exoseal device. The patient tolerated the procedure well. FINDINGS: Superior mesenteric arteriogram demonstrated patency of the trunk, replaced right hepatic arterial supply (anatomic variant), and well-developed collateral supply to the celiac axis distribution. Celiac arteriography demonstrated high-grade origin stenosis with moderate atheromatous irregularity distally in the celiac axis, classic trifurcation anatomy. 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 After coil embolization, there is durable closure of the treated segment of the GDA, nonvisualization of the vascular lesion, no evident complication. FLUOROSCOPY TIME:  46 minutes 30 seconds; 4665 mGy COMPLICATIONS: None immediate. IMPRESSION: 1. 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 2. Technically successful coil embolization of gastroduodenal artery. 3. 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 4. Replaced right hepatic arterial supply from the SMA, an anatomic variant. Electronically Signed   By: Lucrezia Europe M.D.   On: 05/02/2020 12:12   IR Angiogram Selective Each Additional Vessel  Result Date: 05/02/2020 INDICATION: Duodenal ulcer, bleeding, incomplete control with endoscopic maneuvers. hypotensive requiring pressors. EXAM: SELECTIVE MESENTERIC ARTERIOGRAM FLUSH ABDOMINAL AORTOGRAPHY COIL EMBOLIZATION OF GASTRODUODENAL ARTERY ULTRASOUND FOR VASCULAR ACCESS MEDICATIONS: no antibiotics indicated ANESTHESIA/SEDATION: General anesthetic by department of anesthesia PROCEDURE:  Informed consent was obtained from the patient following explanation of the procedure, risks, benefits and alternatives. The patient understands, agrees and consents for the procedure. All questions were addressed. General anaesthesia was induced. A time out was performed prior to the initiation of the procedure. Maximal barrier sterile technique utilized including caps, mask, sterile gowns, sterile gloves, large sterile drape, hand hygiene, and Betadine prep. Patency of the right common femoral artery confirmed with ultrasound documentation. Under real-time ultrasound guidance, the vessel was accessed with a 21-gauge micropuncture needle, exchanged over a 018 guidewire for a transitional dilator, through which a 035 guidewire was advanced. 5 [place] vascular sheath placed. 5 French C2 catheter advanced in the SMA selectively catheterized for  selective arteriography. Subsequently the celiac axis was selectively catheterized for arteriography. Additional views of the SMA within obtained. The C2 was exchanged for a pigtail catheter for flush aortography. Having confirmed visceral anatomy, the C2 was then advanced into the SMA for additional arteriography in variety of projections, and subsequently passage of coaxial Renegade microcatheter in attempts to negotiate collateral supply to the celiac axis and GDA. After a variety of microcatheter and guidewire combinations were attempted, celiac access was again catheterized with a C2 and coaxial microcatheter advanced into the gastroduodenal artery for sub selective arteriography. The vessel was then embolized with 2, 3, and 4 mm overlapping interlock coils with cessation of flow in the segment. After final confirmatory arteriogram, microcatheter and CT were removed. After confirmatory common femoral arteriogram, the sheath was removed and hemostasis achieved with the Exoseal device. The patient tolerated the procedure well. FINDINGS: Superior mesenteric arteriogram  demonstrated patency of the trunk, replaced right hepatic arterial supply (anatomic variant), and well-developed collateral supply to the celiac axis distribution. Celiac arteriography demonstrated high-grade origin stenosis with moderate atheromatous irregularity distally in the celiac axis, classic trifurcation anatomy. 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 After coil embolization, there is durable closure of the treated segment of the GDA, nonvisualization of the vascular lesion, no evident complication. FLUOROSCOPY TIME:  46 minutes 30 seconds; 6060 mGy COMPLICATIONS: None immediate. IMPRESSION: 1. 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 2. Technically successful coil embolization of gastroduodenal artery. 3. 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 4. Replaced right hepatic arterial supply from the SMA, an anatomic variant. Electronically Signed   By: Lucrezia Europe M.D.   On: 05/02/2020 12:12   IR US Guide Vasc Access Right  Result Date: 05/02/2020 INDICATION: Duodenal ulcer, bleeding, incomplete control with endoscopic maneuvers. hypotensive requiring pressors. EXAM: SELECTIVE MESENTERIC ARTERIOGRAM FLUSH ABDOMINAL AORTOGRAPHY COIL EMBOLIZATION OF GASTRODUODENAL ARTERY ULTRASOUND FOR VASCULAR ACCESS MEDICATIONS: no antibiotics indicated ANESTHESIA/SEDATION: General anesthetic by department of anesthesia PROCEDURE: Informed consent was obtained from the patient following explanation of the procedure, risks, benefits and alternatives. The patient understands, agrees and consents for the procedure. All questions were addressed. General anaesthesia was induced. A time out was performed prior to the initiation of the procedure. Maximal barrier sterile technique utilized including caps, mask, sterile gowns,  sterile gloves, large sterile drape, hand hygiene, and Betadine prep. Patency of the right common femoral artery confirmed with ultrasound documentation. Under real-time ultrasound guidance, the vessel was accessed with a 21-gauge micropuncture needle, exchanged over a 018 guidewire for a transitional dilator, through which a 035 guidewire was advanced. 5 [place] vascular sheath placed. 5 French C2 catheter advanced in the SMA selectively catheterized for selective arteriography. Subsequently the celiac axis was selectively catheterized for arteriography. Additional views of the SMA within obtained. The C2 was exchanged for a pigtail catheter for flush aortography. Having confirmed visceral anatomy, the C2 was then advanced into the SMA for additional arteriography in variety of projections, and subsequently passage of coaxial Renegade microcatheter in attempts to negotiate collateral supply to the celiac axis and GDA. After a variety of microcatheter and guidewire combinations were attempted, celiac access was again catheterized with a C2 and coaxial microcatheter advanced into the gastroduodenal artery for sub selective arteriography. The vessel was then embolized with 2, 3, and 4 mm overlapping interlock coils with cessation of flow in the segment. After final confirmatory arteriogram, microcatheter  and CT were removed. After confirmatory common femoral arteriogram, the sheath was removed and hemostasis achieved with the Exoseal device. The patient tolerated the procedure well. FINDINGS: Superior mesenteric arteriogram demonstrated patency of the trunk, replaced right hepatic arterial supply (anatomic variant), and well-developed collateral supply to the celiac axis distribution. Celiac arteriography demonstrated high-grade origin stenosis with moderate atheromatous irregularity distally in the celiac axis, classic trifurcation anatomy. Selective gastroduodenal arteriography demonstrates branch vessels  pseudoaneurysm or contained extravasation. After coil embolization, there is durable closure of the treated segment of the GDA, nonvisualization of the vascular lesion, no evident complication. FLUOROSCOPY TIME:  46 minutes 30 seconds; 8563 mGy COMPLICATIONS: None immediate. IMPRESSION: 1. 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 2. Technically successful coil embolization of gastroduodenal artery. 3. 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 4. Replaced right hepatic arterial supply from the SMA, an anatomic variant. Electronically Signed   By: Lucrezia Europe M.D.   On: 05/02/2020 12:12   DG Chest Port 1 View  Result Date: 05/02/2020 CLINICAL DATA:  Cough EXAM: PORTABLE CHEST 1 VIEW COMPARISON:  April 28, 2020 FINDINGS: The cardiomediastinal silhouette is unchanged in contour.Unchanged opacification along the LEFT costophrenic angle. LEFT costophrenic angle blunting blunting. No pneumothorax. No acute pleuroparenchymal abnormality. Visualized abdomen is unremarkable. Multilevel degenerative changes of the thoracic spine. IMPRESSION: No acute cardiopulmonary abnormality. Electronically Signed   By: Valentino Saxon MD   On: 05/02/2020 09:36        Scheduled Meds: . sodium chloride   Intravenous Once  . sodium chloride   Intravenous Once  . calcium-vitamin D  1 tablet Oral Daily  . Chlorhexidine Gluconate Cloth  6 each Topical Daily  . nicotine  7 mg Transdermal Daily  . pantoprazole  40 mg Intravenous Q12H  . potassium chloride  30 mEq Oral Daily  . simvastatin  40 mg Oral QHS  . sucralfate  1 g Oral TID WC & HS  . vitamin B-12  1,000 mcg Oral Daily   Continuous Infusions: . sodium chloride 10 mL/hr at 05/01/20 2127  . acetaminophen    . norepinephrine (LEVOPHED) Adult infusion Stopped (05/01/20 1840)     LOS: 3 days    Time spent: 35  minutes    Ardith Lewman D Manuella Ghazi, DO [facility]  If 7PM-7AM, please contact night-coverage [email or web address] 05/02/2020, 12:59 PM

## 2020-05-03 ENCOUNTER — Encounter (HOSPITAL_COMMUNITY): Payer: Self-pay | Admitting: Gastroenterology

## 2020-05-03 DIAGNOSIS — K922 Gastrointestinal hemorrhage, unspecified: Secondary | ICD-10-CM | POA: Diagnosis not present

## 2020-05-03 LAB — C-REACTIVE PROTEIN: CRP: 0.8 mg/dL (ref <1.0)

## 2020-05-03 LAB — MAGNESIUM: Magnesium: 2 mg/dL (ref 1.7–2.4)

## 2020-05-03 LAB — CBC
HCT: 27.6 % — ABNORMAL LOW (ref 36.0–46.0)
Hemoglobin: 9 g/dL — ABNORMAL LOW (ref 12.0–15.0)
MCH: 30.5 pg (ref 26.0–34.0)
MCHC: 32.6 g/dL (ref 30.0–36.0)
MCV: 93.6 fL (ref 80.0–100.0)
Platelets: 244 K/uL (ref 150–400)
RBC: 2.95 MIL/uL — ABNORMAL LOW (ref 3.87–5.11)
RDW: 16.1 % — ABNORMAL HIGH (ref 11.5–15.5)
WBC: 13.9 K/uL — ABNORMAL HIGH (ref 4.0–10.5)
nRBC: 0 % (ref 0.0–0.2)

## 2020-05-03 LAB — PROCALCITONIN: Procalcitonin: <0.1 ng/mL

## 2020-05-03 LAB — BASIC METABOLIC PANEL WITH GFR
Anion gap: 6 (ref 5–15)
BUN: 13 mg/dL (ref 8–23)
CO2: 27 mmol/L (ref 22–32)
Calcium: 8.1 mg/dL — ABNORMAL LOW (ref 8.9–10.3)
Chloride: 106 mmol/L (ref 98–111)
Creatinine, Ser: 0.6 mg/dL (ref 0.44–1.00)
GFR calc Af Amer: >60 mL/min (ref >60)
GFR calc non Af Amer: >60 mL/min (ref >60)
Glucose, Bld: 98 mg/dL (ref 70–99)
Potassium: 3.4 mmol/L — ABNORMAL LOW (ref 3.5–5.1)
Sodium: 139 mmol/L (ref 135–145)

## 2020-05-03 LAB — SEDIMENTATION RATE: Sed Rate: 10 mm/h (ref 0–22)

## 2020-05-03 MED ORDER — POTASSIUM CHLORIDE CRYS ER 20 MEQ PO TBCR
20.0000 meq | EXTENDED_RELEASE_TABLET | Freq: Once | ORAL | Status: AC
  Administered 2020-05-03: 20 meq via ORAL
  Filled 2020-05-03: qty 1

## 2020-05-03 NOTE — Progress Notes (Signed)
   Mesenteric arteriogram and GDA embolization in IR 05/01/20  Doing well today Transfer to floor soon per RN  104/40; 85; 15; 95% ra  Hg 9 with no transfusion since 8/10 pm  No bleeding No emesis No BM today; +passing gas

## 2020-05-03 NOTE — Progress Notes (Addendum)
Nicole Welch, M.D. Gastroenterology & Hepatology   Interval History: No acute events overnight.  However, the patient had episodes of hypotension overnight without presence tachycardia. The patient reports feeling well, has been able to tolerate diet adequately.  States that her last bowel movement was brown has not noticed any melena or hematochezia.  Denies any abdominal pain, nausea, vomiting, fever or chills. Patient has presented a stable hemoglobin of 9.0, with decreasing white count of 13,000, BUN is 13 - lower than before.  Inpatient Medications:  Current Facility-Administered Medications:  .  0.9 %  sodium chloride infusion (Manually program via Guardrails IV Fluids), , Intravenous, Once, Johnson, Clanford L, MD .  0.9 %  sodium chloride infusion (Manually program via [place]), , Intravenous, Once, Hessie Dibble T, CRNA .  0.9 %  sodium chloride infusion, , Intravenous, Continuous, Johnson, Clanford L, MD, Last Rate: 10 mL/hr at 05/01/20 2127, New Bag at 05/01/20 2127 .  acetaminophen (TYLENOL) tablet 650 mg, 650 mg, Oral, Q6H PRN **OR** acetaminophen (TYLENOL) suppository 650 mg, 650 mg, Rectal, Q6H PRN, Elgergawy, Silver Huguenin, MD .  ALPRAZolam Duanne Moron) tablet 0.5 mg, 0.5 mg, Oral, TID PRN, Elgergawy, Silver Huguenin, MD, 0.5 mg at 05/03/20 0927 .  calcium-vitamin D (OSCAL WITH D) 500-200 MG-UNIT per tablet 1 tablet, 1 tablet, Oral, Daily, Elgergawy, Silver Huguenin, MD, 1 tablet at 05/03/20 0927 .  Chlorhexidine Gluconate Cloth 2 % PADS 6 each, 6 each, Topical, Daily, Wynetta Emery, Clanford L, MD, 6 each at 05/02/20 1825 .  cyclobenzaprine (FLEXERIL) tablet 10 mg, 10 mg, Oral, BID PRN, Elgergawy, Silver Huguenin, MD, 10 mg at 05/02/20 1547 .  fentaNYL (SUBLIMAZE) injection 25-50 mcg, 25-50 mcg, Intravenous, Q5 min PRN, Pervis Hocking, DO, 25 mcg at 05/01/20 2235 .  HYDROcodone-acetaminophen (NORCO/VICODIN) 5-325 MG per tablet 1-2 tablet, 1-2 tablet, Oral, Q4H PRN, Arne Cleveland, MD, 2  tablet at 05/02/20 2054 .  nicotine (NICODERM CQ - dosed in mg/24 hr) patch 7 mg, 7 mg, Transdermal, Daily, Elgergawy, Silver Huguenin, MD, 7 mg at 05/03/20 0927 .  norepinephrine (LEVOPHED) 78m in 2574mpremix infusion, 0-40 mcg/min, Intravenous, Titrated, Johnson, Clanford L, MD, Stopped at 05/01/20 1840 .  ondansetron (ZOFRAN) tablet 4 mg, 4 mg, Oral, Q6H PRN **OR** ondansetron (ZOFRAN) injection 4 mg, 4 mg, Intravenous, Q6H PRN, Elgergawy, DaSilver HugueninMD, 4 mg at 05/01/20 1223 .  ondansetron (ZOFRAN) injection 4 mg, 4 mg, Intravenous, Once PRN, FiDoroteo GlassmanElizabeth M, DO .  oxyCODONE-acetaminophen (PERCOCET/ROXICET) 5-325 MG per tablet 1 tablet, 1 tablet, Oral, Q6H PRN, Elgergawy, DaSilver HugueninMD, 1 tablet at 05/01/20 1221 .  pantoprazole (PROTONIX) injection 40 mg, 40 mg, Intravenous, Q12H, Elgergawy, DaSilver HugueninMD, 40 mg at 05/03/20 0927 .  potassium chloride 20 MEQ/15ML (10%) solution 30 mEq, 30 mEq, Oral, Daily, ShManuella GhaziPratik D, DO, 30 mEq at 05/03/20 0927 .  simvastatin (ZOCOR) tablet 40 mg, 40 mg, Oral, QHS, Elgergawy, DaSilver HugueninMD, 40 mg at 05/02/20 2100 .  sucralfate (CARAFATE) 1 GM/10ML suspension 1 g, 1 g, Oral, TID WC & HS, Rehman, Najeeb U, MD, 1 g at 05/03/20 0758 .  vitamin B-12 (CYANOCOBALAMIN) tablet 1,000 mcg, 1,000 mcg, Oral, Daily, Johnson, Clanford L, MD, 1,000 mcg at 05/03/20 [id number] I/O    Intake/Output Summary (Last 24 hours) at 05/03/2020 0955 Last data filed at 05/03/2020 0328 Gross per 24 hour  Intake 1004.2 ml  Output 1150 ml  Net -145.8 ml     Physical Exam: Temp:  [97.6 F (  36.4 C)-98.3 F (36.8 C)] 97.6 F (36.4 C) (08/12 0347) Pulse Rate:  [75-93] 88 (08/12 0659) Resp:  [14-21] 14 (08/12 0659) BP: (88-119)/(39-54) 113/48 (08/12 0659) SpO2:  [93 %-98 %] 98 % (08/12 0659)  Temp (24hrs), Avg:98 F (36.7 C), Min:97.6 F (36.4 C), Max:98.3 F (36.8 C) GENERAL: The patient is AO x3, in no acute distress. HEENT: Head is normocephalic and atraumatic. EOMI are intact.  Mouth is well hydrated and without lesions.  Has generalized pallor. NECK: Supple. No masses LUNGS: Clear to auscultation. No presence of rhonchi/wheezing/rales. Adequate chest expansion HEART: RRR, normal s1 and s2. ABDOMEN: Soft, nontender, no guarding, no peritoneal signs, and nondistended. BS +. No masses. EXTREMITIES: Without any cyanosis, clubbing, rash, lesions or edema. NEUROLOGIC: AOx3, no focal motor deficit. SKIN: no jaundice, no rashes  Laboratory Data: CBC:     Component Value Date/Time   WBC 13.9 (H) 05/03/2020 0342   RBC 2.95 (L) 05/03/2020 0342   HGB 9.0 (L) 05/03/2020 0342   HCT 27.6 (L) 05/03/2020 0342   PLT 244 05/03/2020 0342   MCV 93.6 05/03/2020 0342   MCH 30.5 05/03/2020 0342   MCHC 32.6 05/03/2020 0342   RDW 16.1 (H) 05/03/2020 0342   LYMPHSABS 1.1 04/28/2020 2035   MONOABS 0.7 04/28/2020 2035   EOSABS 0.0 04/28/2020 2035   BASOSABS 0.1 04/28/2020 2035   COAG:  Lab Results  Component Value Date   INR 1.2 04/29/2020    BMP:  BMP Latest Ref Rng & Units 05/03/2020 05/02/2020 05/01/2020  Glucose 70 - 99 mg/dL 98 114(H) -  BUN 8 - 23 mg/dL 13 20 -  Creatinine 0.44 - 1.00 mg/dL 0.60 0.53 -  Sodium 135 - 145 mmol/L 139 138 143  Potassium 3.5 - 5.1 mmol/L 3.4(L) 3.6 4.4  Chloride 98 - 111 mmol/L 106 107 -  CO2 22 - 32 mmol/L 27 23 -  Calcium 8.9 - 10.3 mg/dL 8.1(L) 7.5(L) -    HEPATIC:  Hepatic Function Latest Ref Rng & Units 04/28/2020 07/15/2018 07/14/2018  Total Protein 6.5 - 8.1 g/dL 7.7 7.2 6.7  Albumin 3.5 - 5.0 g/dL 3.0(L) 3.2(L) 3.0(L)  AST 15 - 41 U/L 15 32 40  ALT 0 - 44 U/L 11 28 32  Alk Phosphatase 38 - 126 U/L 86 115 116  Total Bilirubin 0.3 - 1.2 mg/dL 0.5 1.4(H) 1.4(H)  Bilirubin, Direct 0.0 - 0.3 mg/dL - - -    CARDIAC:  Lab Results  Component Value Date   TROPONINI 0.14 ([place]) 07/14/2018      Imaging: I personally reviewed and interpreted the available labs, imaging and endoscopic files.   Assessment/Plan: 64 year old female  with past medical history of anxiety, chronic back pain, chronic use of NSAIDs, osteoporosis, dCHF, who came to the hospital after presenting episodes of melena and fatigue.  The patient was noted to have a major drop in her hemoglobin from 17 to a level of 12.9 g/dL, with most recent one 9.1.  She had initial tachycardia but has responded to IV fluids, has not required any blood transfusion.  Underwent EGD on 04/29/2020 which showed presence of a 2 cm hiatal hernia, erythema and edema in gastric antrum with presence of double pylorus.  Notably there was a large cratered ulcer in the duodenal bulb with adherent clot but no bleeding was induced when this was washed.    The patient had repeat endoscopy 2 days later to assess the initial lesion, she was found to have a large clot  that was extending from the duodenum to the gastric chamber.  Removal of the clot could not be achieved but there was presence of active blood oozing from fistulous tract (coming from duodenal ulcer), however visualization of the site of bleeding could not be achieved.  The patient was evaluated by interventional radiology who performed angiography with coil embolization of the GDA.  Since then, the patient has not presented any more episodes of melena and her hemoglobin has remained stable.  She has remained hemodynamically stable with occasional episodes of asymptomatic hypotension.  Patient had positive serology for H. Pylori.  #Upper gastrointestinal bleeding #Large bleeding duodenal ulcer #Gastroenteric fistula  Recommendations:  - OK to transfer to regular floor - Repeat CBC qday, transfuse if Hb <8 - C/w pantoprazole ggt, will need to be discharged on omeprazole 40 mg BID - 2 large bore IV lines - Active T/S -Advance diet as tolerated - Avoid NSAIDs -patient advised to avoid these medications in the long-term - Repeat EGD in 3 months - If patient presents episodes of nausea/vomiting in the future, may need to proceed with  EGD earlier as there is risk of stricturing due to pyloric involvement of the fistulous tract - H. pylori treatment on outpatient basis.  Nicole Peppers, MD Gastroenterology and Hepatology [facility]  Note: Occasional unusual wording and randomly placed punctuation marks may result from the use of speech recognition technology to transcribe this document

## 2020-05-03 NOTE — Progress Notes (Addendum)
PROGRESS NOTE    Nicole Welch  ZMO:[id number] DOB: Jun 01, 1956 DOA: 04/28/2020 PCP: Elfredia Nevins, MD   Brief Narrative:  64 y.o.female,medical history of anxiety, chronic back pain, osteoporosis, chronic diastolic CHF,wentto ED secondary to complaints of generalized weakness and melena, patient reports overall generalized weakness and fatigue for last 3 days, she does report an episode of melena started yesterday, another episode this morning, she does report nausea and vomiting earlier today, reports is nonbilious(she did vomit purple gummy bears she ate earlier today),patient reports she takes Mobic occasionally, she last dose was before 2 days, she is on aspirin 81 mg oral daily chronically. She was admitted with melena and suspected upper GI bleeding.  8/11: Patient noted to have a large clot in her gastric chamber coming from the duodenum on EGD on 8/10.  Recommendations were for Protonix drip and transfer for IR embolization of bleeding vessel.  Patient subsequently transferred to [facility] for mesenteric arteriogram and coil embolization of gastroduodenal artery performed by IR and then transferred back to [facility].  8/12: Patient noted to have stable hemoglobin levels and is tolerating her liquid diet.  Would like to have diet advanced.  Appears stable for transfer to MedSurg floor today.  Assessment & Plan:   Active Problems:   Anxiety   Acute diastolic CHF (congestive heart failure) (HCC)   GI bleed   Upper GI bleeding   B12 deficiency   Gastric ulcer   Hypotension   Acute blood loss anemia secondary to acute upper GI bleed-stable -Status post EGD 8/10 as well as mesenteric arteriogram with coil embolization to gastroduodenal artery on 8/10 by IR -Continue PPI IV twice daily per GI -Diet advanced to full liquid on 8/12 -Appreciate ongoing GI recommendations  Leukocytosis-downward trending -Low procalcitonin -Blood cultures obtained and pending -Chest  x-ray and urine analysis without acute findings  Chronic diastolic CHF-stable -With noted soft blood pressure readings and overall euvolemia -Lasix temporarily on hold until dietary intake stabilizes as well as blood pressure -She is currently with +5 L fluid balance  GAD -Continue on Xanax  Lumbago-improved -Continue home Norco and up to chair -Fentanyl as needed -Eventual ambulation as tolerated  Dyslipidemia -Continue statin and hold ezetimibe for now  Vitamin B12 deficiency -Continue B12 supplementation  DVT prophylaxis: SCDs Code Status: Full code Family Communication:  Spoke with daughter on 8/12. Disposition Plan:   Status is: Inpatient  Remains inpatient appropriate because:Unsafe d/c plan, IV treatments appropriate due to intensity of illness or inability to take PO and Inpatient level of care appropriate due to severity of illness   Dispo: The patient is from: Home  Anticipated d/c is to: Home  Anticipated d/c date is: 1-2 days  Patient currently is not medically stable to d/c.  Patient requires close monitoring on account of recent blood loss anemia and coil embolization.  Slowly advancing diet.  Consultants:   GI  IR  General surgery  Procedures:   EGD 8/10  Mesenteric arteriogram with coil embolization to gastroduodenal artery 8/10  Antimicrobials:   None  Subjective: Patient seen and evaluated today with no new acute complaints or concerns. No acute concerns or events noted overnight.  No bowel movements reported overnight.  Objective: Vitals:   05/03/20 0347 05/03/20 0400 05/03/20 0659 05/03/20 0900  BP:  (!) 101/46 (!) 113/48   Pulse:  79 88   Resp:  15 14   Temp: 97.6 F (36.4 C)   97.7 F (36.5 C)  TempSrc: Oral  Oral  SpO2:  97% 98%   Weight:      Height:        Intake/Output Summary (Last 24 hours) at 05/03/2020 1207 Last data filed at 05/03/2020 0328 Gross per 24 hour   Intake 654.57 ml  Output 1150 ml  Net -495.43 ml   Filed Weights   04/28/20 1934 04/28/20 2350 05/01/20 0942  Weight: 77.1 kg 77 kg 77 kg    Examination:  General exam: Appears calm and comfortable  Respiratory system: Clear to auscultation. Respiratory effort normal. Cardiovascular system: S1 & S2 heard, RRR.  Gastrointestinal system: Abdomen is soft Central nervous system: Alert and oriented. No focal neurological deficits. Extremities: No edema Skin: No rashes, lesions or ulcers Psychiatry: Judgement and insight appear normal. Mood & affect appropriate.     Data Reviewed: I have personally reviewed following labs and imaging studies  CBC: Recent Labs  Lab 04/28/20 2035 04/29/20 0706 04/30/20 0507 04/30/20 0507 05/01/20 0411 05/01/20 0411 05/01/20 1129 05/01/20 1129 05/01/20 1631 05/01/20 1811 05/01/20 1838 05/02/20 0426 05/03/20 0342  WBC 20.3*   < > 12.2*  --  9.2  --  10.5  --   --   --   --  19.1* 13.9*  NEUTROABS 18.3*  --   --   --   --   --   --   --   --   --   --   --   --   HGB 12.9   < > 9.1*   < > 8.9*   < > 8.1*   < > 7.1* 10.9* 9.9* 9.5* 9.0*  HCT 39.9   < > 29.2*   < > 28.8*   < > 25.2*   < > 21.0* 32.0* 29.0* 28.3* 27.6*  MCV 99.8   < > 103.5*  --  102.9*  --  102.0*  --   --   --   --  91.0 93.6  PLT 532*   < > 431*  --  461*  --  421*  --   --   --   --  189 244   < > = values in this interval not displayed.   Basic Metabolic Panel: Recent Labs  Lab 04/29/20 0706 04/29/20 0706 04/30/20 0507 04/30/20 0507 05/01/20 0411 05/01/20 0411 05/01/20 1631 05/01/20 1811 05/01/20 1838 05/02/20 0426 05/03/20 0342  NA 141   < > 141   < > 141   < > 141 143 143 138 139  K 4.8   < > 4.4   < > 3.3*   < > 4.5 4.6 4.4 3.6 3.4*  CL 101   < > 106  --  106  --  104  --   --  107 106  CO2 30  --  27  --  25  --   --   --   --  23 27  GLUCOSE 101*   < > 96  --  99  --  135*  --   --  114* 98  BUN 43*   < > 16  --  8  --  10  --   --  20 13  CREATININE  0.83   < > 0.67  --  0.63  --  0.50  --   --  0.53 0.60  CALCIUM 8.5*  --  8.6*  --  8.4*  --   --   --   --  7.5* 8.1*  MG  --   --  2.1  --  1.9  --   --   --   --  1.4* 2.0   < > = values in this interval not displayed.   GFR: Estimated Creatinine Clearance: 69.8 mL/min (by C-G formula based on SCr of 0.6 mg/dL). Liver Function Tests: Recent Labs  Lab 04/28/20 2035  AST 15  ALT 11  ALKPHOS 86  BILITOT 0.5  PROT 7.7  ALBUMIN 3.0*   No results for input(s): LIPASE, AMYLASE in the last 168 hours. No results for input(s): AMMONIA in the last 168 hours. Coagulation Profile: Recent Labs  Lab 04/29/20 0706  INR 1.2   Cardiac Enzymes: No results for input(s): CKTOTAL, CKMB, CKMBINDEX, TROPONINI in the last 168 hours. BNP (last 3 results) No results for input(s): PROBNP in the last 8760 hours. HbA1C: No results for input(s): HGBA1C in the last 72 hours. CBG: No results for input(s): GLUCAP in the last 168 hours. Lipid Profile: No results for input(s): CHOL, HDL, LDLCALC, TRIG, CHOLHDL, LDLDIRECT in the last 72 hours. Thyroid Function Tests: No results for input(s): TSH, T4TOTAL, FREET4, T3FREE, THYROIDAB in the last 72 hours. Anemia Panel: No results for input(s): VITAMINB12, FOLATE, FERRITIN, TIBC, IRON, RETICCTPCT in the last 72 hours. Sepsis Labs: Recent Labs  Lab 04/29/20 0706 04/29/20 0915 05/02/20 0832 05/03/20 0342  PROCALCITON  --   --  <0.10 <0.10  LATICACIDVEN 1.2 1.7  --   --     Recent Results (from the past 240 hour(s))  SARS Coronavirus 2 by RT PCR (hospital order, performed in [facility] hospital lab) Nasopharyngeal Nasopharyngeal Swab     Status: None   Collection Time: 04/28/20  8:20 PM   Specimen: Nasopharyngeal Swab  Result Value Ref Range Status   SARS Coronavirus 2 NEGATIVE NEGATIVE Final    Comment: (NOTE) SARS-CoV-2 target nucleic acids are NOT DETECTED.  The SARS-CoV-2 RNA is generally detectable in upper and lower respiratory specimens  during the acute phase of infection. The lowest concentration of SARS-CoV-2 viral copies this assay can detect is 250 copies / mL. A negative result does not preclude SARS-CoV-2 infection and should not be used as the sole basis for treatment or other patient management decisions.  A negative result may occur with improper specimen collection / handling, submission of specimen other than nasopharyngeal swab, presence of viral mutation(s) within the areas targeted by this assay, and inadequate number of viral copies (<250 copies / mL). A negative result must be combined with clinical observations, patient history, and epidemiological information.  Fact Sheet for Patients:   BoilerBrush.com.cy  Fact Sheet for Healthcare Providers: https://pope.com/  This test is not yet approved or  cleared by the [place] FDA and has been authorized for detection and/or diagnosis of SARS-CoV-2 by FDA under an Emergency Use Authorization (EUA).  This EUA will remain in effect (meaning this test can be used) for the duration of the COVID-19 declaration under Section 564(b)(1) of the Act, 21 U.S.C. section 360bbb-3(b)(1), unless the authorization is terminated or revoked sooner.  Performed at [facility], [address] [place], [place] [address]   Urine culture     Status: Abnormal   Collection Time: 04/28/20  9:31 PM   Specimen: Urine, Clean Catch  Result Value Ref Range Status   Specimen Description   Final    URINE, CLEAN CATCH Performed at [facility], [address] [place], [place] [address]    Special Requests   Final    NONE Performed at [facility]  [facility], [address], [place], [place] [address]    Culture (A)  Final    <10,000 COLONIES/mL INSIGNIFICANT GROWTH Performed at [facility], [address], [place], [place] [address]    Report Status 04/30/2020 FINAL  Final  Culture, blood (routine x 2)     Status: None (Preliminary  result)   Collection Time: 05/02/20  8:32 AM   Specimen: BLOOD RIGHT HAND  Result Value Ref Range Status   Specimen Description   Final    BLOOD RIGHT HAND BOTTLES DRAWN AEROBIC AND ANAEROBIC   Special Requests   Final    Blood Culture adequate volume Performed at [facility], [address] [place], [place] [address]    Culture PENDING  Incomplete   Report Status PENDING  Incomplete  Culture, blood (routine x 2)     Status: None (Preliminary result)   Collection Time: 05/02/20  8:36 AM   Specimen: BLOOD RIGHT HAND  Result Value Ref Range Status   Specimen Description   Final    BLOOD RIGHT HAND BOTTLES DRAWN AEROBIC AND ANAEROBIC   Special Requests   Final    Blood Culture adequate volume Performed at [facility], [address], [place], [place] [address]    Culture PENDING  Incomplete   Report Status PENDING  Incomplete         Radiology Studies: IR Aortagram Abdominal Serialogram  Result Date: 05/02/2020 INDICATION: Duodenal ulcer, bleeding, incomplete control with endoscopic maneuvers. hypotensive requiring pressors. EXAM: SELECTIVE MESENTERIC ARTERIOGRAM FLUSH ABDOMINAL AORTOGRAPHY COIL EMBOLIZATION OF GASTRODUODENAL ARTERY ULTRASOUND FOR VASCULAR ACCESS MEDICATIONS: no antibiotics indicated ANESTHESIA/SEDATION: General anesthetic by department of anesthesia PROCEDURE: Informed consent was obtained from the patient following explanation of the procedure, risks, benefits and alternatives. The patient understands, agrees and consents for the procedure. All questions were addressed. General anaesthesia was induced. A time out was performed prior to the initiation of the procedure. Maximal barrier sterile technique utilized including caps, mask, sterile gowns, sterile gloves, large sterile drape, hand hygiene, and Betadine prep. Patency of the right common femoral artery confirmed with ultrasound documentation. Under real-time ultrasound guidance, the vessel was accessed with a  21-gauge micropuncture needle, exchanged over a 018 guidewire for a transitional dilator, through which a 035 guidewire was advanced. 5 [place] vascular sheath placed. 5 French C2 catheter advanced in the SMA selectively catheterized for selective arteriography. Subsequently the celiac axis was selectively catheterized for arteriography. Additional views of the SMA within obtained. The C2 was exchanged for a pigtail catheter for flush aortography. Having confirmed visceral anatomy, the C2 was then advanced into the SMA for additional arteriography in variety of projections, and subsequently passage of coaxial Renegade microcatheter in attempts to negotiate collateral supply to the celiac axis and GDA. After a variety of microcatheter and guidewire combinations were attempted, celiac access was again catheterized with a C2 and coaxial microcatheter advanced into the gastroduodenal artery for sub selective arteriography. The vessel was then embolized with 2, 3, and 4 mm overlapping interlock coils with cessation of flow in the segment. After final confirmatory arteriogram, microcatheter and CT were removed. After confirmatory common femoral arteriogram, the sheath was removed and hemostasis achieved with the Exoseal device. The patient tolerated the procedure well. FINDINGS: Superior mesenteric arteriogram demonstrated patency of the trunk, replaced right hepatic arterial supply (anatomic variant), and well-developed collateral supply to the celiac axis distribution. Celiac arteriography demonstrated high-grade origin stenosis with moderate atheromatous irregularity distally in the celiac axis, classic trifurcation anatomy. Selective  gastroduodenal arteriography demonstrates branch vessels pseudoaneurysm or contained extravasation. After coil embolization, there is durable closure of the treated segment of the GDA, nonvisualization of the vascular lesion, no evident complication. FLUOROSCOPY TIME:  46 minutes 30  seconds; 1567 mGy COMPLICATIONS: None immediate. IMPRESSION: 1. 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 2. Technically successful coil embolization of gastroduodenal artery. 3. 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 4. Replaced right hepatic arterial supply from the SMA, an anatomic variant. Electronically Signed   By: Corlis Leak M.D.   On: 05/02/2020 12:12   IR Angiogram Visceral Selective  Result Date: 05/02/2020 INDICATION: Duodenal ulcer, bleeding, incomplete control with endoscopic maneuvers. hypotensive requiring pressors. EXAM: SELECTIVE MESENTERIC ARTERIOGRAM FLUSH ABDOMINAL AORTOGRAPHY COIL EMBOLIZATION OF GASTRODUODENAL ARTERY ULTRASOUND FOR VASCULAR ACCESS MEDICATIONS: no antibiotics indicated ANESTHESIA/SEDATION: General anesthetic by department of anesthesia PROCEDURE: Informed consent was obtained from the patient following explanation of the procedure, risks, benefits and alternatives. The patient understands, agrees and consents for the procedure. All questions were addressed. General anaesthesia was induced. A time out was performed prior to the initiation of the procedure. Maximal barrier sterile technique utilized including caps, mask, sterile gowns, sterile gloves, large sterile drape, hand hygiene, and Betadine prep. Patency of the right common femoral artery confirmed with ultrasound documentation. Under real-time ultrasound guidance, the vessel was accessed with a 21-gauge micropuncture needle, exchanged over a 018 guidewire for a transitional dilator, through which a 035 guidewire was advanced. 5 [place] vascular sheath placed. 5 French C2 catheter advanced in the SMA selectively catheterized for selective arteriography. Subsequently the celiac axis was selectively catheterized for arteriography. Additional views of the SMA within obtained. The C2 was  exchanged for a pigtail catheter for flush aortography. Having confirmed visceral anatomy, the C2 was then advanced into the SMA for additional arteriography in variety of projections, and subsequently passage of coaxial Renegade microcatheter in attempts to negotiate collateral supply to the celiac axis and GDA. After a variety of microcatheter and guidewire combinations were attempted, celiac access was again catheterized with a C2 and coaxial microcatheter advanced into the gastroduodenal artery for sub selective arteriography. The vessel was then embolized with 2, 3, and 4 mm overlapping interlock coils with cessation of flow in the segment. After final confirmatory arteriogram, microcatheter and CT were removed. After confirmatory common femoral arteriogram, the sheath was removed and hemostasis achieved with the Exoseal device. The patient tolerated the procedure well. FINDINGS: Superior mesenteric arteriogram demonstrated patency of the trunk, replaced right hepatic arterial supply (anatomic variant), and well-developed collateral supply to the celiac axis distribution. Celiac arteriography demonstrated high-grade origin stenosis with moderate atheromatous irregularity distally in the celiac axis, classic trifurcation anatomy. 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 After coil embolization, there is durable closure of the treated segment of the GDA, nonvisualization of the vascular lesion, no evident complication. FLUOROSCOPY TIME:  46 minutes 30 seconds; 1567 mGy COMPLICATIONS: None immediate. IMPRESSION: 1. 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 2. Technically successful coil embolization of gastroduodenal artery. 3. 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 4. Replaced right hepatic arterial supply  from the SMA, an anatomic variant. Electronically Signed   By: Corlis Leak M.D.   On: 05/02/2020 12:12   IR Angiogram Visceral Selective  Result Date: 05/02/2020 INDICATION: Duodenal ulcer, bleeding, incomplete  control with endoscopic maneuvers. hypotensive requiring pressors. EXAM: SELECTIVE MESENTERIC ARTERIOGRAM FLUSH ABDOMINAL AORTOGRAPHY COIL EMBOLIZATION OF GASTRODUODENAL ARTERY ULTRASOUND FOR VASCULAR ACCESS MEDICATIONS: no antibiotics indicated ANESTHESIA/SEDATION: General anesthetic by department of anesthesia PROCEDURE: Informed consent was obtained from the patient following explanation of the procedure, risks, benefits and alternatives. The patient understands, agrees and consents for the procedure. All questions were addressed. General anaesthesia was induced. A time out was performed prior to the initiation of the procedure. Maximal barrier sterile technique utilized including caps, mask, sterile gowns, sterile gloves, large sterile drape, hand hygiene, and Betadine prep. Patency of the right common femoral artery confirmed with ultrasound documentation. Under real-time ultrasound guidance, the vessel was accessed with a 21-gauge micropuncture needle, exchanged over a 018 guidewire for a transitional dilator, through which a 035 guidewire was advanced. 5 [place] vascular sheath placed. 5 French C2 catheter advanced in the SMA selectively catheterized for selective arteriography. Subsequently the celiac axis was selectively catheterized for arteriography. Additional views of the SMA within obtained. The C2 was exchanged for a pigtail catheter for flush aortography. Having confirmed visceral anatomy, the C2 was then advanced into the SMA for additional arteriography in variety of projections, and subsequently passage of coaxial Renegade microcatheter in attempts to negotiate collateral supply to the celiac axis and GDA. After a variety of microcatheter and guidewire combinations were attempted, celiac  access was again catheterized with a C2 and coaxial microcatheter advanced into the gastroduodenal artery for sub selective arteriography. The vessel was then embolized with 2, 3, and 4 mm overlapping interlock coils with cessation of flow in the segment. After final confirmatory arteriogram, microcatheter and CT were removed. After confirmatory common femoral arteriogram, the sheath was removed and hemostasis achieved with the Exoseal device. The patient tolerated the procedure well. FINDINGS: Superior mesenteric arteriogram demonstrated patency of the trunk, replaced right hepatic arterial supply (anatomic variant), and well-developed collateral supply to the celiac axis distribution. Celiac arteriography demonstrated high-grade origin stenosis with moderate atheromatous irregularity distally in the celiac axis, classic trifurcation anatomy. 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 After coil embolization, there is durable closure of the treated segment of the GDA, nonvisualization of the vascular lesion, no evident complication. FLUOROSCOPY TIME:  46 minutes 30 seconds; 1567 mGy COMPLICATIONS: None immediate. IMPRESSION: 1. 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 2. Technically successful coil embolization of gastroduodenal artery. 3. 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 4. Replaced right hepatic arterial supply from the SMA, an anatomic variant. Electronically Signed   By: Corlis Leak M.D.   On: 05/02/2020 12:12   IR Angiogram Selective Each Additional Vessel  Result Date: 05/02/2020 INDICATION: Duodenal ulcer, bleeding, incomplete control with endoscopic maneuvers. hypotensive requiring pressors. EXAM: SELECTIVE MESENTERIC ARTERIOGRAM FLUSH ABDOMINAL AORTOGRAPHY COIL EMBOLIZATION OF GASTRODUODENAL  ARTERY ULTRASOUND FOR VASCULAR ACCESS MEDICATIONS: no antibiotics indicated ANESTHESIA/SEDATION: General anesthetic by department of anesthesia PROCEDURE: Informed consent was obtained from the patient following explanation of the procedure, risks, benefits and alternatives. The patient understands, agrees and consents for the procedure. All questions were addressed. General anaesthesia was induced. A time out was performed prior to the initiation of the procedure. Maximal barrier sterile technique utilized including caps, mask, sterile gowns, sterile gloves, large sterile drape, hand hygiene, and Betadine prep. Patency of the right common femoral artery confirmed with ultrasound documentation. Under real-time ultrasound guidance, the vessel was accessed with a 21-gauge micropuncture  needle, exchanged over a 018 guidewire for a transitional dilator, through which a 035 guidewire was advanced. 5 [place] vascular sheath placed. 5 French C2 catheter advanced in the SMA selectively catheterized for selective arteriography. Subsequently the celiac axis was selectively catheterized for arteriography. Additional views of the SMA within obtained. The C2 was exchanged for a pigtail catheter for flush aortography. Having confirmed visceral anatomy, the C2 was then advanced into the SMA for additional arteriography in variety of projections, and subsequently passage of coaxial Renegade microcatheter in attempts to negotiate collateral supply to the celiac axis and GDA. After a variety of microcatheter and guidewire combinations were attempted, celiac access was again catheterized with a C2 and coaxial microcatheter advanced into the gastroduodenal artery for sub selective arteriography. The vessel was then embolized with 2, 3, and 4 mm overlapping interlock coils with cessation of flow in the segment. After final confirmatory arteriogram, microcatheter and CT were removed. After confirmatory common femoral arteriogram, the  sheath was removed and hemostasis achieved with the Exoseal device. The patient tolerated the procedure well. FINDINGS: Superior mesenteric arteriogram demonstrated patency of the trunk, replaced right hepatic arterial supply (anatomic variant), and well-developed collateral supply to the celiac axis distribution. Celiac arteriography demonstrated high-grade origin stenosis with moderate atheromatous irregularity distally in the celiac axis, classic trifurcation anatomy. 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 After coil embolization, there is durable closure of the treated segment of the GDA, nonvisualization of the vascular lesion, no evident complication. FLUOROSCOPY TIME:  46 minutes 30 seconds; 1567 mGy COMPLICATIONS: None immediate. IMPRESSION: 1. 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 2. Technically successful coil embolization of gastroduodenal artery. 3. 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 4. Replaced right hepatic arterial supply from the SMA, an anatomic variant. Electronically Signed   By: Corlis Leak  Hassell M.D.   On: 05/02/2020 12:12   IR Angiogram Selective Each Additional Vessel  Result Date: 05/02/2020 INDICATION: Duodenal ulcer, bleeding, incomplete control with endoscopic maneuvers. hypotensive requiring pressors. EXAM: SELECTIVE MESENTERIC ARTERIOGRAM FLUSH ABDOMINAL AORTOGRAPHY COIL EMBOLIZATION OF GASTRODUODENAL ARTERY ULTRASOUND FOR VASCULAR ACCESS MEDICATIONS: no antibiotics indicated ANESTHESIA/SEDATION: General anesthetic by department of anesthesia PROCEDURE: Informed consent was obtained from the patient following explanation of the procedure, risks, benefits and alternatives. The patient understands, agrees and consents for the procedure. All questions were addressed. General  anaesthesia was induced. A time out was performed prior to the initiation of the procedure. Maximal barrier sterile technique utilized including caps, mask, sterile gowns, sterile gloves, large sterile drape, hand hygiene, and Betadine prep. Patency of the right common femoral artery confirmed with ultrasound documentation. Under real-time ultrasound guidance, the vessel was accessed with a 21-gauge micropuncture needle, exchanged over a 018 guidewire for a transitional dilator, through which a 035 guidewire was advanced. 5 [place] vascular sheath placed. 5 French C2 catheter advanced in the SMA selectively catheterized for selective arteriography. Subsequently the celiac axis was selectively catheterized for arteriography. Additional views of the SMA within obtained. The C2 was exchanged for a pigtail catheter for flush aortography. Having confirmed visceral anatomy, the C2 was then advanced into the SMA for additional arteriography in variety of projections, and subsequently passage of coaxial Renegade microcatheter in attempts to negotiate collateral supply to the celiac axis and GDA. After a variety of microcatheter and guidewire combinations were attempted, celiac access was again catheterized with a C2 and coaxial microcatheter advanced into  the gastroduodenal artery for sub selective arteriography. The vessel was then embolized with 2, 3, and 4 mm overlapping interlock coils with cessation of flow in the segment. After final confirmatory arteriogram, microcatheter and CT were removed. After confirmatory common femoral arteriogram, the sheath was removed and hemostasis achieved with the Exoseal device. The patient tolerated the procedure well. FINDINGS: Superior mesenteric arteriogram demonstrated patency of the trunk, replaced right hepatic arterial supply (anatomic variant), and well-developed collateral supply to the celiac axis distribution. Celiac arteriography demonstrated high-grade origin stenosis with  moderate atheromatous irregularity distally in the celiac axis, classic trifurcation anatomy. 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 After coil embolization, there is durable closure of the treated segment of the GDA, nonvisualization of the vascular lesion, no evident complication. FLUOROSCOPY TIME:  46 minutes 30 seconds; 1567 mGy COMPLICATIONS: None immediate. IMPRESSION: 1. 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 2. Technically successful coil embolization of gastroduodenal artery. 3. 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 4. Replaced right hepatic arterial supply from the SMA, an anatomic variant. Electronically Signed   By: Corlis Leak M.D.   On: 05/02/2020 12:12   IR US Guide Vasc Access Right  Result Date: 05/02/2020 INDICATION: Duodenal ulcer, bleeding, incomplete control with endoscopic maneuvers. hypotensive requiring pressors. EXAM: SELECTIVE MESENTERIC ARTERIOGRAM FLUSH ABDOMINAL AORTOGRAPHY COIL EMBOLIZATION OF GASTRODUODENAL ARTERY ULTRASOUND FOR VASCULAR ACCESS MEDICATIONS: no antibiotics indicated ANESTHESIA/SEDATION: General anesthetic by department of anesthesia PROCEDURE: Informed consent was obtained from the patient following explanation of the procedure, risks, benefits and alternatives. The patient understands, agrees and consents for the procedure. All questions were addressed. General anaesthesia was induced. A time out was performed prior to the initiation of the procedure. Maximal barrier sterile technique utilized including caps, mask, sterile gowns, sterile gloves, large sterile drape, hand hygiene, and Betadine prep. Patency of the right common femoral artery confirmed with ultrasound documentation. Under real-time ultrasound guidance, the vessel was accessed with a 21-gauge  micropuncture needle, exchanged over a 018 guidewire for a transitional dilator, through which a 035 guidewire was advanced. 5 [place] vascular sheath placed. 5 French C2 catheter advanced in the SMA selectively catheterized for selective arteriography. Subsequently the celiac axis was selectively catheterized for arteriography. Additional views of the SMA within obtained. The C2 was exchanged for a pigtail catheter for flush aortography. Having confirmed visceral anatomy, the C2 was then advanced into the SMA for additional arteriography in variety of projections, and subsequently passage of coaxial Renegade microcatheter in attempts to negotiate collateral supply to the celiac axis and GDA. After a variety of microcatheter and guidewire combinations were attempted, celiac access was again catheterized with a C2 and coaxial microcatheter advanced into the gastroduodenal artery for sub selective arteriography. The vessel was then embolized with 2, 3, and 4 mm overlapping interlock coils with cessation of flow in the segment. After final confirmatory arteriogram, microcatheter and CT were removed. After confirmatory common femoral arteriogram, the sheath was removed and hemostasis achieved with the Exoseal device. The patient tolerated the procedure well. FINDINGS: Superior mesenteric arteriogram demonstrated patency of the trunk, replaced right hepatic arterial supply (anatomic variant), and well-developed collateral supply to the celiac axis distribution. Celiac arteriography demonstrated high-grade origin stenosis with moderate atheromatous irregularity distally in the celiac axis, classic trifurcation anatomy. 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 After coil embolization, there is durable closure of the treated segment  of the GDA, nonvisualization of the vascular lesion, no evident complication. FLUOROSCOPY TIME:  46 minutes 30 seconds; 1567  mGy COMPLICATIONS: None immediate. IMPRESSION: 1. 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 2. Technically successful coil embolization of gastroduodenal artery. 3. 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 4. Replaced right hepatic arterial supply from the SMA, an anatomic variant. Electronically Signed   By: Corlis Leak M.D.   On: 05/02/2020 12:12   DG Chest Port 1 View  Result Date: 05/02/2020 CLINICAL DATA:  Cough EXAM: PORTABLE CHEST 1 VIEW COMPARISON:  April 28, 2020 FINDINGS: The cardiomediastinal silhouette is unchanged in contour.Unchanged opacification along the LEFT costophrenic angle. LEFT costophrenic angle blunting blunting. No pneumothorax. No acute pleuroparenchymal abnormality. Visualized abdomen is unremarkable. Multilevel degenerative changes of the thoracic spine. IMPRESSION: No acute cardiopulmonary abnormality. Electronically Signed   By: Meda Klinefelter MD   On: 05/02/2020 09:36        Scheduled Meds: . sodium chloride   Intravenous Once  . sodium chloride   Intravenous Once  . calcium-vitamin D  1 tablet Oral Daily  . Chlorhexidine Gluconate Cloth  6 each Topical Daily  . nicotine  7 mg Transdermal Daily  . pantoprazole  40 mg Intravenous Q12H  . potassium chloride  30 mEq Oral Daily  . simvastatin  40 mg Oral QHS  . sucralfate  1 g Oral TID WC & HS  . vitamin B-12  1,000 mcg Oral Daily   Continuous Infusions: . sodium chloride 10 mL/hr at 05/01/20 2127  . norepinephrine (LEVOPHED) Adult infusion Stopped (05/01/20 1840)     LOS: 4 days    Time spent: 30 minutes    Nikkia Devoss Hoover Brunette, DO [facility]  If 7PM-7AM, please contact night-coverage [email or web address] 05/03/2020, 12:07 PM

## 2020-05-04 ENCOUNTER — Other Ambulatory Visit (INDEPENDENT_AMBULATORY_CARE_PROVIDER_SITE_OTHER): Payer: Self-pay | Admitting: Gastroenterology

## 2020-05-04 DIAGNOSIS — I5031 Acute diastolic (congestive) heart failure: Secondary | ICD-10-CM | POA: Diagnosis not present

## 2020-05-04 LAB — BASIC METABOLIC PANEL
Anion gap: 6 (ref 5–15)
BUN: 7 mg/dL — ABNORMAL LOW (ref 8–23)
CO2: 28 mmol/L (ref 22–32)
Calcium: 8.2 mg/dL — ABNORMAL LOW (ref 8.9–10.3)
Chloride: 105 mmol/L (ref 98–111)
Creatinine, Ser: 0.49 mg/dL (ref 0.44–1.00)
GFR calc Af Amer: >60 mL/min (ref >60)
GFR calc non Af Amer: >60 mL/min (ref >60)
Glucose, Bld: 96 mg/dL (ref 70–99)
Potassium: 3.7 mmol/L (ref 3.5–5.1)
Sodium: 139 mmol/L (ref 135–145)

## 2020-05-04 LAB — CBC
HCT: 28.7 % — ABNORMAL LOW (ref 36.0–46.0)
Hemoglobin: 9.2 g/dL — ABNORMAL LOW (ref 12.0–15.0)
MCH: 30.8 pg (ref 26.0–34.0)
MCHC: 32.1 g/dL (ref 30.0–36.0)
MCV: 96 fL (ref 80.0–100.0)
Platelets: 294 10*3/uL (ref 150–400)
RBC: 2.99 MIL/uL — ABNORMAL LOW (ref 3.87–5.11)
RDW: 15.7 % — ABNORMAL HIGH (ref 11.5–15.5)
WBC: 12.5 10*3/uL — ABNORMAL HIGH (ref 4.0–10.5)
nRBC: 0 % (ref 0.0–0.2)

## 2020-05-04 LAB — MAGNESIUM: Magnesium: 1.8 mg/dL (ref 1.7–2.4)

## 2020-05-04 LAB — CORTISOL-AM, BLOOD: Cortisol - AM: 6.6 ug/dL — ABNORMAL LOW (ref 6.7–22.6)

## 2020-05-04 MED ORDER — FUROSEMIDE 80 MG PO TABS
80.0000 mg | ORAL_TABLET | Freq: Two times a day (BID) | ORAL | Status: DC
  Administered 2020-05-04 – 2020-05-05 (×3): 80 mg via ORAL
  Filled 2020-05-04: qty 2
  Filled 2020-05-04: qty 1
  Filled 2020-05-04: qty 2

## 2020-05-04 NOTE — Progress Notes (Signed)
Hi [name],   Please schedule repeat EGD in 3 months with either me or Dr. Karilyn Cota.  Thanks,  Katrinka Blazing, MD Gastroenterology and Hepatology [facility]

## 2020-05-04 NOTE — Progress Notes (Signed)
PROGRESS NOTE    DENZIL BRISTOL  WER:[id number] DOB: December 25, 1955 DOA: 04/28/2020 PCP: Elfredia Nevins, MD   Brief Narrative:  64 y.o.female,medical history of anxiety, chronic back pain, osteoporosis, chronic diastolic CHF,wentto ED secondary to complaints of generalized weakness and melena, patient reports overall generalized weakness and fatigue for last 3 days, she does report an episode of melena started yesterday, another episode this morning, she does report nausea and vomiting earlier today, reports is nonbilious(she did vomit purple gummy bears she ate earlier today),patient reports she takes Mobic occasionally, she last dose was before 2 days, she is on aspirin 81 mg oral daily chronically. She was admitted with melena and suspected upper GI bleeding.  8/11:Patient noted to have a large clot in her gastric chamber coming from the duodenum on EGD on 8/10. Recommendations were for Protonix drip and transfer for IR embolization of bleeding vessel. Patient subsequently transferred to [facility] for mesenteric arteriogram and coil embolization of gastroduodenal artery performed by IR and then transferred back to [facility].  8/12: Patient noted to have stable hemoglobin levels and is tolerating her liquid diet.  Would like to have diet advanced.  Appears stable for transfer to MedSurg floor today.  8/13: Patient continues to have stable hemoglobin levels.  Asking for more to eat today and diet will be advanced further.  She is also noted to be volume overloaded, but has had borderline soft blood pressure readings.  We will try Lasix 80 mg p.o. twice daily today and monitor blood pressures carefully while diuresing.  Anticipate discharge in a.m. if continues to remain stable.  Assessment & Plan:   Active Problems:   Anxiety   Acute diastolic CHF (congestive heart failure) (HCC)   GI bleed   Upper GI bleeding   B12 deficiency   Gastric ulcer   Hypotension   Acute blood  loss anemia secondary to acute upper GI bleed-stable -Status post EGD 8/10 as well as mesenteric arteriogram with coil embolization to gastroduodenal artery on 8/10 by IR -Continue PPI IV twice daily per GI -Diet advanced to heart healthy on 8/13 -Appreciate ongoing GI recommendations  Leukocytosis-downward trending -Low procalcitonin -Blood cultures obtained and pending -Chest x-ray and urine analysis without acute findings  Chronic diastolic CHF-mild exacerbation noted -With noted soft blood pressure readings and volume overload -Plan to give Lasix 80 mg twice daily p.o. today and monitor diuresis -She is currently with +5 L fluid balance  GAD -Continue on Xanax  Lumbago-improved -Continue home Norco and up to chair -Fentanyl as needed -Eventual ambulation as tolerated  Dyslipidemia -Continue statin and hold ezetimibe for now  Vitamin B12 deficiency -Continue B12 supplementation  DVT prophylaxis:SCDs Code Status:Full code Family Communication: Spoke with daughter on 8/13. Disposition Plan:  Status is: Inpatient  Remains inpatient appropriate because:Unsafe d/c plan, IV treatments appropriate due to intensity of illness or inability to take PO and Inpatient level of care appropriate due to severity of illness   Dispo: The patient is from:Home Anticipated d/c is PP:JKDT Anticipated d/c date is: 1 days Patient currently is not medically stable to d/c.Patient requires close monitoring on account of recent blood loss anemia and coil embolization.  Slowly advancing diet. Diuresing today.  Consultants:  GI  IR  General surgery  Procedures:  EGD 8/10  Mesenteric arteriogram with coil embolization to gastroduodenal artery 8/10  Antimicrobials:   None  Subjective: Patient seen and evaluated today with no new acute complaints or concerns. No acute concerns or events noted overnight.  She is noted to  have swelling in her abdomen as well as her lower extremities.  She would like to resume her home Lasix and advance her diet.  No acute overnight events noted.  Objective: Vitals:   05/04/20 0400 05/04/20 0451 05/04/20 0751 05/04/20 1138  BP: (!) 115/48     Pulse: 75  81   Resp: 16     Temp: 98 F (36.7 C)  98.1 F (36.7 C) 98.2 F (36.8 C)  TempSrc: Oral  Oral Oral  SpO2: 100%  95%   Weight:  80.4 kg    Height:        Intake/Output Summary (Last 24 hours) at 05/04/2020 1301 Last data filed at 05/03/2020 1507 Gross per 24 hour  Intake 116.37 ml  Output --  Net 116.37 ml   Filed Weights   04/28/20 2350 05/01/20 0942 05/04/20 0451  Weight: 77 kg 77 kg 80.4 kg    Examination:  General exam: Appears calm and comfortable  Respiratory system: Clear to auscultation. Respiratory effort normal. Cardiovascular system: S1 & S2 heard, RRR.  Gastrointestinal system: Abdomen is minimally distended Central nervous system: Alert and oriented. No focal neurological deficits. Extremities: Bilateral 1+ edema Skin: No rashes, lesions or ulcers Psychiatry: Judgement and insight appear normal. Mood & affect appropriate.     Data Reviewed: I have personally reviewed following labs and imaging studies  CBC: Recent Labs  Lab 04/28/20 2035 04/29/20 0706 05/01/20 0411 05/01/20 0411 05/01/20 1129 05/01/20 1631 05/01/20 1811 05/01/20 1838 05/02/20 0426 05/03/20 0342 05/04/20 0337  WBC 20.3*   < > 9.2  --  10.5  --   --   --  19.1* 13.9* 12.5*  NEUTROABS 18.3*  --   --   --   --   --   --   --   --   --   --   HGB 12.9   < > 8.9*   < > 8.1*   < > 10.9* 9.9* 9.5* 9.0* 9.2*  HCT 39.9   < > 28.8*   < > 25.2*   < > 32.0* 29.0* 28.3* 27.6* 28.7*  MCV 99.8   < > 102.9*  --  102.0*  --   --   --  91.0 93.6 96.0  PLT 532*   < > 461*  --  421*  --   --   --  189 244 294   < > = values in this interval not displayed.   Basic Metabolic Panel: Recent Labs  Lab 04/30/20 0507 04/30/20 0507  05/01/20 0411 05/01/20 0411 05/01/20 1631 05/01/20 1631 05/01/20 1811 05/01/20 1838 05/02/20 0426 05/03/20 0342 05/04/20 0337  NA 141   < > 141   < > 141   < > 143 143 138 139 139  K 4.4   < > 3.3*   < > 4.5   < > 4.6 4.4 3.6 3.4* 3.7  CL 106   < > 106  --  104  --   --   --  107 106 105  CO2 27  --  25  --   --   --   --   --  23 27 28   GLUCOSE 96   < > 99  --  135*  --   --   --  114* 98 96  BUN 16   < > 8  --  10  --   --   --  20 13 7*  CREATININE 0.67   < > 0.63  --  0.50  --   --   --  0.53 0.60 0.49  CALCIUM 8.6*  --  8.4*  --   --   --   --   --  7.5* 8.1* 8.2*  MG 2.1  --  1.9  --   --   --   --   --  1.4* 2.0 1.8   < > = values in this interval not displayed.   GFR: Estimated Creatinine Clearance: 71.3 mL/min (by C-G formula based on SCr of 0.49 mg/dL). Liver Function Tests: Recent Labs  Lab 04/28/20 2035  AST 15  ALT 11  ALKPHOS 86  BILITOT 0.5  PROT 7.7  ALBUMIN 3.0*   No results for input(s): LIPASE, AMYLASE in the last 168 hours. No results for input(s): AMMONIA in the last 168 hours. Coagulation Profile: Recent Labs  Lab 04/29/20 0706  INR 1.2   Cardiac Enzymes: No results for input(s): CKTOTAL, CKMB, CKMBINDEX, TROPONINI in the last 168 hours. BNP (last 3 results) No results for input(s): PROBNP in the last 8760 hours. HbA1C: No results for input(s): HGBA1C in the last 72 hours. CBG: No results for input(s): GLUCAP in the last 168 hours. Lipid Profile: No results for input(s): CHOL, HDL, LDLCALC, TRIG, CHOLHDL, LDLDIRECT in the last 72 hours. Thyroid Function Tests: No results for input(s): TSH, T4TOTAL, FREET4, T3FREE, THYROIDAB in the last 72 hours. Anemia Panel: No results for input(s): VITAMINB12, FOLATE, FERRITIN, TIBC, IRON, RETICCTPCT in the last 72 hours. Sepsis Labs: Recent Labs  Lab 04/29/20 0706 04/29/20 0915 05/02/20 0832 05/03/20 0342  PROCALCITON  --   --  <0.10 <0.10  LATICACIDVEN 1.2 1.7  --   --     Recent Results  (from the past 240 hour(s))  SARS Coronavirus 2 by RT PCR (hospital order, [facility]) Nasopharyngeal Nasopharyngeal Swab     Status: None   Collection Time: 04/28/20  8:20 PM   Specimen: Nasopharyngeal Swab  Result Value Ref Range Status   SARS Coronavirus 2 NEGATIVE NEGATIVE Final    Comment: (NOTE) SARS-CoV-2 target nucleic acids are NOT DETECTED.  The SARS-CoV-2 RNA is generally detectable in upper and lower respiratory specimens during the acute phase of infection. The lowest concentration of SARS-CoV-2 viral copies this assay can detect is 250 copies / mL. A negative result does not preclude SARS-CoV-2 infection and should not be used as the sole basis for treatment or other patient management decisions.  A negative result may occur with improper specimen collection / handling, submission of specimen other than nasopharyngeal swab, presence of viral mutation(s) within the areas targeted by this assay, and inadequate number of viral copies (<250 copies / mL). A negative result must be combined with clinical observations, patient history, and epidemiological information.  Fact Sheet for Patients:   BoilerBrush.com.cyhttps://www.fda.gov/media/136312/download  Fact Sheet for Healthcare Providers: https://pope.com/https://www.fda.gov/media/136313/download  This test is not yet approved or  cleared by the [place] FDA and has been authorized for detection and/or diagnosis of SARS-CoV-2 by FDA under an Emergency Use Authorization (EUA).  This EUA will remain in effect (meaning this test can be used) for the duration of the COVID-19 declaration under Section 564(b)(1) of the Act, 21 U.S.C. section 360bbb-3(b)(1), unless the authorization is terminated or revoked sooner.  Performed at [facility], [address] [place], [place] [address]   Urine culture     Status: Abnormal   Collection Time: 04/28/20  9:31 PM   Specimen: Urine, Clean Catch  Result Value Ref Range Status    Specimen Description   Final    URINE, CLEAN CATCH Performed at [facility], [address] [place], [place] [address]    Special Requests   Final    NONE Performed at [facility], [address] [place], [place] [address]    Culture (A)  Final    <10,000 COLONIES/mL INSIGNIFICANT GROWTH Performed at [facility], [address] [place], [place] [address]    Report Status 04/30/2020 FINAL  Final  Culture, blood (routine x 2)     Status: None (Preliminary result)   Collection Time: 05/02/20  8:32 AM   Specimen: BLOOD RIGHT HAND  Result Value Ref Range Status   Specimen Description   Final    BLOOD RIGHT HAND BOTTLES DRAWN AEROBIC AND ANAEROBIC   Special Requests Blood Culture adequate volume  Final   Culture   Final    NO GROWTH 2 DAYS Performed at [facility], [address], [place], [place] [address]    Report Status PENDING  Incomplete  Culture, blood (routine x 2)     Status: None (Preliminary result)   Collection Time: 05/02/20  8:36 AM   Specimen: BLOOD RIGHT HAND  Result Value Ref Range Status   Specimen Description   Final    BLOOD RIGHT HAND BOTTLES DRAWN AEROBIC AND ANAEROBIC   Special Requests Blood Culture adequate volume  Final   Culture   Final    NO GROWTH 2 DAYS Performed at [facility], [address] [place], [place] [address]    Report Status PENDING  Incomplete         Radiology Studies: No results found.      Scheduled Meds: . sodium chloride   Intravenous Once  . sodium chloride   Intravenous Once  . calcium-vitamin D  1 tablet Oral Daily  . Chlorhexidine Gluconate Cloth  6 each Topical Daily  . furosemide  80 mg Oral BID  . nicotine  7 mg Transdermal Daily  . pantoprazole  40 mg Intravenous Q12H  . potassium chloride  30 mEq Oral Daily  . simvastatin  40 mg Oral QHS  . sucralfate  1 g Oral TID WC & HS  . vitamin B-12  1,000 mcg Oral Daily   Continuous Infusions: . norepinephrine (LEVOPHED) Adult infusion Stopped  (05/01/20 1840)     LOS: 5 days    Time spent: 30 minutes    Kiwana Deblasi Hoover Brunette, DO [facility]  If 7PM-7AM, please contact night-coverage [email or web address] 05/04/2020, 1:01 PM

## 2020-05-05 DIAGNOSIS — K922 Gastrointestinal hemorrhage, unspecified: Secondary | ICD-10-CM | POA: Diagnosis not present

## 2020-05-05 LAB — BPAM RBC
Blood Product Expiration Date: 202108132359
Blood Product Expiration Date: 202108222359
Blood Product Expiration Date: 202108262359
Blood Product Expiration Date: 202108262359
Blood Product Expiration Date: 202108272359
Blood Product Expiration Date: 202108312359
Blood Product Expiration Date: 202109022359
Blood Product Expiration Date: 202109072359
ISSUE DATE / TIME: 202108101350
ISSUE DATE / TIME: 202108101411
ISSUE DATE / TIME: 202108120955
ISSUE DATE / TIME: 202108101700
ISSUE DATE / TIME: 202108101700
ISSUE DATE / TIME: 202108101735
ISSUE DATE / TIME: 202108101735
Unit Type and Rh: 600
Unit Type and Rh: 600
Unit Type and Rh: 6200
Unit Type and Rh: 6200
Unit Type and Rh: 5100
Unit Type and Rh: 5100
Unit Type and Rh: 6200
Unit Type and Rh: 6200

## 2020-05-05 LAB — TYPE AND SCREEN
ABO/RH(D): A POS
ABO/RH(D): A POS
Antibody Screen: NEGATIVE
Antibody Screen: NEGATIVE
Unit division: 0
Unit division: 0
Unit division: 0
Unit division: 0
Unit division: 0
Unit division: 0
Unit division: 0
Unit division: 0

## 2020-05-05 LAB — CBC
HCT: 33.2 % — ABNORMAL LOW (ref 36.0–46.0)
Hemoglobin: 10.6 g/dL — ABNORMAL LOW (ref 12.0–15.0)
MCH: 30.1 pg (ref 26.0–34.0)
MCHC: 31.9 g/dL (ref 30.0–36.0)
MCV: 94.3 fL (ref 80.0–100.0)
Platelets: 411 10*3/uL — ABNORMAL HIGH (ref 150–400)
RBC: 3.52 MIL/uL — ABNORMAL LOW (ref 3.87–5.11)
RDW: 15.2 % (ref 11.5–15.5)
WBC: 13.3 10*3/uL — ABNORMAL HIGH (ref 4.0–10.5)
nRBC: 0 % (ref 0.0–0.2)

## 2020-05-05 MED ORDER — SUCRALFATE 1 GM/10ML PO SUSP: 1.0000 g | Freq: Three times a day (TID) | ORAL | 0 refills | Status: DC

## 2020-05-05 MED ORDER — CYANOCOBALAMIN 1000 MCG PO TABS: 1000.0000 ug | ORAL_TABLET | Freq: Every day | ORAL | 0 refills | Status: AC

## 2020-05-05 MED ORDER — PANTOPRAZOLE SODIUM 40 MG PO TBEC
40.0000 mg | DELAYED_RELEASE_TABLET | Freq: Two times a day (BID) | ORAL | 1 refills | Status: DC
Start: 2020-05-05 — End: 2020-07-31

## 2020-05-05 NOTE — Discharge Summary (Signed)
Physician Discharge Summary  Nicole Welch:[id number] DOB: 12-02-1955 DOA: 04/28/2020  PCP: Elfredia Nevins, MD  Admit date: 04/28/2020  Discharge date: 05/05/2020  Admitted From:Home  Disposition:  Home  Recommendations for Outpatient Follow-up:  1. Follow up with PCP in 1-2 weeks 2. Continue on Protonix twice daily as prescribed by GI as well as sucralfate for several more days 3. Follow-up with GI in 3 months as recommended for repeat EGD 4. Avoid NSAIDs which was discussed with patient 5. Resume usual home dose of Lasix 6. Continue on B12 supplementation as prescribed  Home Health: None  Equipment/Devices: None  Discharge Condition: Stable  CODE STATUS: Full  Diet recommendation: Heart Healthy  Brief/Interim Summary: 64 y.o.female,medical history of anxiety, chronic back pain, osteoporosis, chronic diastolic CHF,wentto ED secondary to complaints of generalized weakness and melena, patient reports overall generalized weakness and fatigue for last 3 days, she does report an episode of melena started yesterday, another episode this morning, she does report nausea and vomiting earlier today, reports is nonbilious(she did vomit purple gummy bears she ate earlier today),patient reports she takes Mobic occasionally, she last dose was before 2 days, she is on aspirin 81 mg oral daily chronically. She was admitted with melena and suspected upper GI bleeding.  8/11:Patient noted to have a large clot in her gastric chamber coming from the duodenum on EGD on 8/10. Recommendations were for Protonix drip and transfer for IR embolization of bleeding vessel. Patient subsequently transferred to [facility] for mesenteric arteriogram and coil embolization of gastroduodenal artery performed by IR and then transferred back to [facility].  8/12:Patient noted to have stable hemoglobin levels and is tolerating her liquid diet. Would like to have diet advanced. Appears stable for  transfer to MedSurg floor today.  8/13: Patient continues to have stable hemoglobin levels.  Asking for more to eat today and diet will be advanced further.  She is also noted to be volume overloaded, but has had borderline soft blood pressure readings.  We will try Lasix 80 mg p.o. twice daily today and monitor blood pressures carefully while diuresing.  Anticipate discharge in a.m. if continues to remain stable.  8/14: Patient stable for discharge today and tolerating full diet.  Hemoglobin levels remained stable with no further overt bleeding identified.  She has diuresed well with oral Lasix which she will continue at home and blood pressures have remained stable.  No other acute events noted throughout the course of this admission and she is stable for discharge today with further follow-up with GI scheduled in 3 months.  She is to avoid NSAIDs moving forward and is to remain on PPI twice daily as well as some sucralfate as prescribed.  Discharge Diagnoses:  Active Problems:   Anxiety   Acute diastolic CHF (congestive heart failure) (HCC)   GI bleed   Upper GI bleeding   B12 deficiency   Gastric ulcer   Hypotension  Principal discharge diagnosis: Acute blood loss anemia secondary to acute upper GI bleed status post coil embolization to gastroduodenal artery on 8/10.  Discharge Instructions  Discharge Instructions    Diet - low sodium heart healthy   Complete by: As directed    If the dressing is still on your incision site when you go home, remove it on the third day after your surgery date. Remove dressing if it begins to fall off, or if it is dirty or damaged before the third day.   Complete by: As directed  Increase activity slowly   Complete by: As directed      Allergies as of 05/05/2020   No Known Allergies     Medication List    TAKE these medications   ALPRAZolam 0.5 MG tablet Commonly known as: XANAX Take 0.5 mg by mouth 3 (three) times daily as needed for  anxiety.   aspirin 81 MG EC tablet Take 1 tablet (81 mg total) by mouth daily.   Calcium + D3 600-200 MG-UNIT Tabs Take 1 tablet by mouth daily.   cyanocobalamin 1000 MCG tablet Take 1 tablet (1,000 mcg total) by mouth daily.   cyclobenzaprine 10 MG tablet Commonly known as: FLEXERIL Take 1 tablet by mouth 2 (two) times daily as needed for muscle spasms.   ezetimibe 10 MG tablet Commonly known as: ZETIA Take 10 mg by mouth at bedtime.   Lasix 40 MG tablet Generic drug: furosemide Take 60 mg by mouth 2 (two) times daily.   nicotine 7 mg/24hr patch Commonly known as: NICODERM CQ - dosed in mg/24 hr Place 1 patch (7 mg total) onto the skin daily.   oxyCODONE-acetaminophen 5-325 MG tablet Commonly known as: PERCOCET/ROXICET Take 1 tablet by mouth every 6 (six) hours as needed. Back pain   pantoprazole 40 MG tablet Commonly known as: Protonix Take 1 tablet (40 mg total) by mouth 2 (two) times daily.   potassium chloride 20 MEQ packet Commonly known as: KLOR-CON Take 30 mEq by mouth daily.   simvastatin 40 MG tablet Commonly known as: ZOCOR Take 40 mg by mouth at bedtime.   sucralfate 1 GM/10ML suspension Commonly known as: CARAFATE Take 10 mLs (1 g total) by mouth 4 (four) times daily -  with meals and at bedtime for 5 days.            Discharge Care Instructions  (From admission, onward)         Start     Ordered   05/05/20 0000  If the dressing is still on your incision site when you go home, remove it on the third day after your surgery date. Remove dressing if it begins to fall off, or if it is dirty or damaged before the third day.        05/05/20 [id number]          Follow-up Information    Elfredia NevinsFusco, Lawrence, MD Follow up in 1 week(s).   Specialty: Internal Medicine Contact information: [address] [place] [id number] [phone]        gastroenterology Follow up in 3 month(s).              No Known  Allergies  Consultations:  GI  IR  General surgery   Procedures/Studies: IR Aortagram Abdominal Serialogram  Result Date: 05/02/2020 INDICATION: Duodenal ulcer, bleeding, incomplete control with endoscopic maneuvers. hypotensive requiring pressors. EXAM: SELECTIVE MESENTERIC ARTERIOGRAM FLUSH ABDOMINAL AORTOGRAPHY COIL EMBOLIZATION OF GASTRODUODENAL ARTERY ULTRASOUND FOR VASCULAR ACCESS MEDICATIONS: no antibiotics indicated ANESTHESIA/SEDATION: General anesthetic by department of anesthesia PROCEDURE: Informed consent was obtained from the patient following explanation of the procedure, risks, benefits and alternatives. The patient understands, agrees and consents for the procedure. All questions were addressed. General anaesthesia was induced. A time out was performed prior to the initiation of the procedure. Maximal barrier sterile technique utilized including caps, mask, sterile gowns, sterile gloves, large sterile drape, hand hygiene, and Betadine prep. Patency of the right common femoral artery confirmed with ultrasound documentation. Under real-time ultrasound guidance, the vessel was accessed with a  21-gauge micropuncture needle, exchanged over a 018 guidewire for a transitional dilator, through which a 035 guidewire was advanced. 5 [place] vascular sheath placed. 5 French C2 catheter advanced in the SMA selectively catheterized for selective arteriography. Subsequently the celiac axis was selectively catheterized for arteriography. Additional views of the SMA within obtained. The C2 was exchanged for a pigtail catheter for flush aortography. Having confirmed visceral anatomy, the C2 was then advanced into the SMA for additional arteriography in variety of projections, and subsequently passage of coaxial Renegade microcatheter in attempts to negotiate collateral supply to the celiac axis and GDA. After a variety of microcatheter and guidewire combinations were attempted, celiac access was again  catheterized with a C2 and coaxial microcatheter advanced into the gastroduodenal artery for sub selective arteriography. The vessel was then embolized with 2, 3, and 4 mm overlapping interlock coils with cessation of flow in the segment. After final confirmatory arteriogram, microcatheter and CT were removed. After confirmatory common femoral arteriogram, the sheath was removed and hemostasis achieved with the Exoseal device. The patient tolerated the procedure well. FINDINGS: Superior mesenteric arteriogram demonstrated patency of the trunk, replaced right hepatic arterial supply (anatomic variant), and well-developed collateral supply to the celiac axis distribution. Celiac arteriography demonstrated high-grade origin stenosis with moderate atheromatous irregularity distally in the celiac axis, classic trifurcation anatomy. 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 After coil embolization, there is durable closure of the treated segment of the GDA, nonvisualization of the vascular lesion, no evident complication. FLUOROSCOPY TIME:  46 minutes 30 seconds; 1567 mGy COMPLICATIONS: None immediate. IMPRESSION: 1. 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 2. Technically successful coil embolization of gastroduodenal artery. 3. 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 4. Replaced right hepatic arterial supply from the SMA, an anatomic variant. Electronically Signed   By: Corlis Leak M.D.   On: 05/02/2020 12:12   IR Angiogram Visceral Selective  Result Date: 05/02/2020 INDICATION: Duodenal ulcer, bleeding, incomplete control with endoscopic maneuvers. hypotensive requiring pressors. EXAM: SELECTIVE MESENTERIC ARTERIOGRAM FLUSH ABDOMINAL AORTOGRAPHY COIL EMBOLIZATION OF GASTRODUODENAL ARTERY ULTRASOUND FOR VASCULAR  ACCESS MEDICATIONS: no antibiotics indicated ANESTHESIA/SEDATION: General anesthetic by department of anesthesia PROCEDURE: Informed consent was obtained from the patient following explanation of the procedure, risks, benefits and alternatives. The patient understands, agrees and consents for the procedure. All questions were addressed. General anaesthesia was induced. A time out was performed prior to the initiation of the procedure. Maximal barrier sterile technique utilized including caps, mask, sterile gowns, sterile gloves, large sterile drape, hand hygiene, and Betadine prep. Patency of the right common femoral artery confirmed with ultrasound documentation. Under real-time ultrasound guidance, the vessel was accessed with a 21-gauge micropuncture needle, exchanged over a 018 guidewire for a transitional dilator, through which a 035 guidewire was advanced. 5 [place] vascular sheath placed. 5 French C2 catheter advanced in the SMA selectively catheterized for selective arteriography. Subsequently the celiac axis was selectively catheterized for arteriography. Additional views of the SMA within obtained. The C2 was exchanged for a pigtail catheter for flush aortography. Having confirmed visceral anatomy, the C2 was then advanced into the SMA for additional arteriography in variety of projections, and subsequently passage of coaxial Renegade microcatheter in attempts to negotiate collateral supply to the celiac axis and GDA. After a variety of microcatheter and guidewire combinations were attempted, celiac access was again catheterized with a C2 and coaxial microcatheter advanced into  the gastroduodenal artery for sub selective arteriography. The vessel was then embolized with 2, 3, and 4 mm overlapping interlock coils with cessation of flow in the segment. After final confirmatory arteriogram, microcatheter and CT were removed. After confirmatory common femoral arteriogram, the sheath was removed and hemostasis  achieved with the Exoseal device. The patient tolerated the procedure well. FINDINGS: Superior mesenteric arteriogram demonstrated patency of the trunk, replaced right hepatic arterial supply (anatomic variant), and well-developed collateral supply to the celiac axis distribution. Celiac arteriography demonstrated high-grade origin stenosis with moderate atheromatous irregularity distally in the celiac axis, classic trifurcation anatomy. 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 After coil embolization, there is durable closure of the treated segment of the GDA, nonvisualization of the vascular lesion, no evident complication. FLUOROSCOPY TIME:  46 minutes 30 seconds; 1567 mGy COMPLICATIONS: None immediate. IMPRESSION: 1. 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 2. Technically successful coil embolization of gastroduodenal artery. 3. 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 4. Replaced right hepatic arterial supply from the SMA, an anatomic variant. Electronically Signed   By: Corlis Leak M.D.   On: 05/02/2020 12:12   IR Angiogram Visceral Selective  Result Date: 05/02/2020 INDICATION: Duodenal ulcer, bleeding, incomplete control with endoscopic maneuvers. hypotensive requiring pressors. EXAM: SELECTIVE MESENTERIC ARTERIOGRAM FLUSH ABDOMINAL AORTOGRAPHY COIL EMBOLIZATION OF GASTRODUODENAL ARTERY ULTRASOUND FOR VASCULAR ACCESS MEDICATIONS: no antibiotics indicated ANESTHESIA/SEDATION: General anesthetic by department of anesthesia PROCEDURE: Informed consent was obtained from the patient following explanation of the procedure, risks, benefits and alternatives. The patient understands, agrees and consents for the procedure. All questions were addressed. General anaesthesia was induced. A time out was performed  prior to the initiation of the procedure. Maximal barrier sterile technique utilized including caps, mask, sterile gowns, sterile gloves, large sterile drape, hand hygiene, and Betadine prep. Patency of the right common femoral artery confirmed with ultrasound documentation. Under real-time ultrasound guidance, the vessel was accessed with a 21-gauge micropuncture needle, exchanged over a 018 guidewire for a transitional dilator, through which a 035 guidewire was advanced. 5 [place] vascular sheath placed. 5 French C2 catheter advanced in the SMA selectively catheterized for selective arteriography. Subsequently the celiac axis was selectively catheterized for arteriography. Additional views of the SMA within obtained. The C2 was exchanged for a pigtail catheter for flush aortography. Having confirmed visceral anatomy, the C2 was then advanced into the SMA for additional arteriography in variety of projections, and subsequently passage of coaxial Renegade microcatheter in attempts to negotiate collateral supply to the celiac axis and GDA. After a variety of microcatheter and guidewire combinations were attempted, celiac access was again catheterized with a C2 and coaxial microcatheter advanced into the gastroduodenal artery for sub selective arteriography. The vessel was then embolized with 2, 3, and 4 mm overlapping interlock coils with cessation of flow in the segment. After final confirmatory arteriogram, microcatheter and CT were removed. After confirmatory common femoral arteriogram, the sheath was removed and hemostasis achieved with the Exoseal device. The patient tolerated the procedure well. FINDINGS: Superior mesenteric arteriogram demonstrated patency of the trunk, replaced right hepatic arterial supply (anatomic variant), and well-developed collateral supply to the celiac axis distribution. Celiac arteriography demonstrated high-grade origin stenosis with moderate atheromatous irregularity distally in the  celiac axis, classic trifurcation anatomy. 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 After coil embolization, there is durable closure of the treated segment of  the GDA, nonvisualization of the vascular lesion, no evident complication. FLUOROSCOPY TIME:  46 minutes 30 seconds; 1567 mGy COMPLICATIONS: None immediate. IMPRESSION: 1. 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 2. Technically successful coil embolization of gastroduodenal artery. 3. 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 4. Replaced right hepatic arterial supply from the SMA, an anatomic variant. Electronically Signed   By: Corlis Leak M.D.   On: 05/02/2020 12:12   IR Angiogram Selective Each Additional Vessel  Result Date: 05/02/2020 INDICATION: Duodenal ulcer, bleeding, incomplete control with endoscopic maneuvers. hypotensive requiring pressors. EXAM: SELECTIVE MESENTERIC ARTERIOGRAM FLUSH ABDOMINAL AORTOGRAPHY COIL EMBOLIZATION OF GASTRODUODENAL ARTERY ULTRASOUND FOR VASCULAR ACCESS MEDICATIONS: no antibiotics indicated ANESTHESIA/SEDATION: General anesthetic by department of anesthesia PROCEDURE: Informed consent was obtained from the patient following explanation of the procedure, risks, benefits and alternatives. The patient understands, agrees and consents for the procedure. All questions were addressed. General anaesthesia was induced. A time out was performed prior to the initiation of the procedure. Maximal barrier sterile technique utilized including caps, mask, sterile gowns, sterile gloves, large sterile drape, hand hygiene, and Betadine prep. Patency of the right common femoral artery confirmed with ultrasound documentation. Under real-time ultrasound guidance, the vessel was accessed with a 21-gauge micropuncture needle, exchanged  over a 018 guidewire for a transitional dilator, through which a 035 guidewire was advanced. 5 [place] vascular sheath placed. 5 French C2 catheter advanced in the SMA selectively catheterized for selective arteriography. Subsequently the celiac axis was selectively catheterized for arteriography. Additional views of the SMA within obtained. The C2 was exchanged for a pigtail catheter for flush aortography. Having confirmed visceral anatomy, the C2 was then advanced into the SMA for additional arteriography in variety of projections, and subsequently passage of coaxial Renegade microcatheter in attempts to negotiate collateral supply to the celiac axis and GDA. After a variety of microcatheter and guidewire combinations were attempted, celiac access was again catheterized with a C2 and coaxial microcatheter advanced into the gastroduodenal artery for sub selective arteriography. The vessel was then embolized with 2, 3, and 4 mm overlapping interlock coils with cessation of flow in the segment. After final confirmatory arteriogram, microcatheter and CT were removed. After confirmatory common femoral arteriogram, the sheath was removed and hemostasis achieved with the Exoseal device. The patient tolerated the procedure well. FINDINGS: Superior mesenteric arteriogram demonstrated patency of the trunk, replaced right hepatic arterial supply (anatomic variant), and well-developed collateral supply to the celiac axis distribution. Celiac arteriography demonstrated high-grade origin stenosis with moderate atheromatous irregularity distally in the celiac axis, classic trifurcation anatomy. 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 After coil embolization, there is durable closure of the treated segment of the GDA, nonvisualization of the vascular lesion, no evident complication. FLUOROSCOPY TIME:  46 minutes 30 seconds; 1567 mGy COMPLICATIONS: None  immediate. IMPRESSION: 1. 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 2. Technically successful coil embolization of gastroduodenal artery. 3. 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 4. Replaced right hepatic arterial supply from the SMA, an anatomic variant. Electronically Signed   By: Corlis Leak M.D.   On: 05/02/2020 12:12   IR Angiogram Selective Each Additional Vessel  Result Date: 05/02/2020 INDICATION: Duodenal ulcer, bleeding, incomplete control with endoscopic maneuvers. hypotensive requiring pressors. EXAM: SELECTIVE MESENTERIC ARTERIOGRAM FLUSH ABDOMINAL AORTOGRAPHY COIL EMBOLIZATION OF GASTRODUODENAL  ARTERY ULTRASOUND FOR VASCULAR ACCESS MEDICATIONS: no antibiotics indicated ANESTHESIA/SEDATION: General anesthetic by department of anesthesia PROCEDURE: Informed consent was obtained from the patient following explanation of the procedure, risks, benefits and alternatives. The patient understands, agrees and consents for the procedure. All questions were addressed. General anaesthesia was induced. A time out was performed prior to the initiation of the procedure. Maximal barrier sterile technique utilized including caps, mask, sterile gowns, sterile gloves, large sterile drape, hand hygiene, and Betadine prep. Patency of the right common femoral artery confirmed with ultrasound documentation. Under real-time ultrasound guidance, the vessel was accessed with a 21-gauge micropuncture needle, exchanged over a 018 guidewire for a transitional dilator, through which a 035 guidewire was advanced. 5 [place] vascular sheath placed. 5 French C2 catheter advanced in the SMA selectively catheterized for selective arteriography. Subsequently the celiac axis was selectively catheterized for arteriography. Additional views of the SMA within obtained. The C2 was exchanged for a pigtail  catheter for flush aortography. Having confirmed visceral anatomy, the C2 was then advanced into the SMA for additional arteriography in variety of projections, and subsequently passage of coaxial Renegade microcatheter in attempts to negotiate collateral supply to the celiac axis and GDA. After a variety of microcatheter and guidewire combinations were attempted, celiac access was again catheterized with a C2 and coaxial microcatheter advanced into the gastroduodenal artery for sub selective arteriography. The vessel was then embolized with 2, 3, and 4 mm overlapping interlock coils with cessation of flow in the segment. After final confirmatory arteriogram, microcatheter and CT were removed. After confirmatory common femoral arteriogram, the sheath was removed and hemostasis achieved with the Exoseal device. The patient tolerated the procedure well. FINDINGS: Superior mesenteric arteriogram demonstrated patency of the trunk, replaced right hepatic arterial supply (anatomic variant), and well-developed collateral supply to the celiac axis distribution. Celiac arteriography demonstrated high-grade origin stenosis with moderate atheromatous irregularity distally in the celiac axis, classic trifurcation anatomy. 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 After coil embolization, there is durable closure of the treated segment of the GDA, nonvisualization of the vascular lesion, no evident complication. FLUOROSCOPY TIME:  46 minutes 30 seconds; 1567 mGy COMPLICATIONS: None immediate. IMPRESSION: 1. 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 2. Technically successful coil embolization of gastroduodenal artery. 3. 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 4. Replaced right hepatic arterial supply from the SMA, an anatomic  variant. Electronically Signed   By: Corlis Leak M.D.   On: 05/02/2020 12:12   IR US Guide Vasc Access Right  Result Date: 05/02/2020 INDICATION: Duodenal ulcer, bleeding, incomplete control with endoscopic maneuvers. hypotensive requiring pressors. EXAM: SELECTIVE MESENTERIC ARTERIOGRAM FLUSH ABDOMINAL AORTOGRAPHY COIL EMBOLIZATION OF GASTRODUODENAL ARTERY ULTRASOUND FOR VASCULAR ACCESS MEDICATIONS: no antibiotics indicated ANESTHESIA/SEDATION: General anesthetic by department of anesthesia PROCEDURE: Informed consent was obtained from the patient following explanation of the procedure, risks, benefits and alternatives. The patient understands, agrees and consents for the procedure. All questions were addressed. General anaesthesia was induced. A time out was performed prior to the initiation of the procedure. Maximal barrier sterile technique utilized including caps, mask, sterile gowns, sterile gloves, large sterile drape, hand hygiene, and Betadine prep. Patency of the right common femoral artery confirmed with ultrasound documentation. Under real-time ultrasound guidance, the vessel was accessed with a 21-gauge micropuncture needle, exchanged over a 018 guidewire for a transitional dilator, through which a 035 guidewire was advanced. 5  [place] vascular sheath placed. 5 French C2 catheter advanced in the SMA selectively catheterized for selective arteriography. Subsequently the celiac axis was selectively catheterized for arteriography. Additional views of the SMA within obtained. The C2 was exchanged for a pigtail catheter for flush aortography. Having confirmed visceral anatomy, the C2 was then advanced into the SMA for additional arteriography in variety of projections, and subsequently passage of coaxial Renegade microcatheter in attempts to negotiate collateral supply to the celiac axis and GDA. After a variety of microcatheter and guidewire combinations were attempted, celiac access was again  catheterized with a C2 and coaxial microcatheter advanced into the gastroduodenal artery for sub selective arteriography. The vessel was then embolized with 2, 3, and 4 mm overlapping interlock coils with cessation of flow in the segment. After final confirmatory arteriogram, microcatheter and CT were removed. After confirmatory common femoral arteriogram, the sheath was removed and hemostasis achieved with the Exoseal device. The patient tolerated the procedure well. FINDINGS: Superior mesenteric arteriogram demonstrated patency of the trunk, replaced right hepatic arterial supply (anatomic variant), and well-developed collateral supply to the celiac axis distribution. Celiac arteriography demonstrated high-grade origin stenosis with moderate atheromatous irregularity distally in the celiac axis, classic trifurcation anatomy. 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 After coil embolization, there is durable closure of the treated segment of the GDA, nonvisualization of the vascular lesion, no evident complication. FLUOROSCOPY TIME:  46 minutes 30 seconds; 1567 mGy COMPLICATIONS: None immediate. IMPRESSION: 1. 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 2. Technically successful coil embolization of gastroduodenal artery. 3. 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 4. Replaced right hepatic arterial supply from the SMA, an anatomic variant. Electronically Signed   By: Corlis Leak M.D.   On: 05/02/2020 12:12   DG Chest Port 1 View  Result Date: 05/02/2020 CLINICAL DATA:  Cough EXAM: PORTABLE CHEST 1 VIEW COMPARISON:  April 28, 2020 FINDINGS: The cardiomediastinal silhouette is unchanged in contour.Unchanged opacification along the LEFT costophrenic angle. LEFT costophrenic angle blunting blunting. No pneumothorax.  No acute pleuroparenchymal abnormality. Visualized abdomen is unremarkable. Multilevel degenerative changes of the thoracic spine. IMPRESSION: No acute cardiopulmonary abnormality. Electronically Signed   By: Meda Klinefelter MD   On: 05/02/2020 09:36   DG Chest Portable 1 View  Result Date: 04/28/2020 CLINICAL DATA:  Hypoxemia, generalized weakness, nausea and vomiting EXAM: PORTABLE CHEST 1 VIEW COMPARISON:  07/19/2018 FINDINGS: Heart size and pulmonary vascularity are normal. Slight fibrosis or atelectasis in the left lung base. No consolidation or airspace disease. No pleural effusions. No pneumothorax. Mediastinal contours appear intact. IMPRESSION: Slight fibrosis or atelectasis in the left lung base. No evidence of active pulmonary disease. Electronically Signed   By: Burman Nieves M.D.   On: 04/28/2020 21:41     Discharge Exam: Vitals:   05/05/20 0800 05/05/20 0805  BP:  (!) 155/74  Pulse: 98   Resp:    Temp:    SpO2: 96%    Vitals:   05/05/20 0400 05/05/20 0443 05/05/20 0800 05/05/20 0805  BP: (!) 130/32   (!) 155/74  Pulse: 76  98   Resp:      Temp:  97.9 F (36.6 C)    TempSrc:  Oral    SpO2: 94%  96%   Weight:  75.9 kg    Height:        General: Pt is alert, awake, not in acute  distress Cardiovascular: RRR, S1/S2 +, no rubs, no gallops Respiratory: CTA bilaterally, no wheezing, no rhonchi Abdominal: Soft, NT, ND, bowel sounds + Extremities: no edema, no cyanosis    The results of significant diagnostics from this hospitalization (including imaging, microbiology, ancillary and laboratory) are listed below for reference.     Microbiology: Recent Results (from the past 240 hour(s))  SARS Coronavirus 2 by RT PCR (hospital order, performed in [facility] hospital lab) Nasopharyngeal Nasopharyngeal Swab     Status: None   Collection Time: 04/28/20  8:20 PM   Specimen: Nasopharyngeal Swab  Result Value Ref Range Status   SARS Coronavirus 2 NEGATIVE NEGATIVE  Final    Comment: (NOTE) SARS-CoV-2 target nucleic acids are NOT DETECTED.  The SARS-CoV-2 RNA is generally detectable in upper and lower respiratory specimens during the acute phase of infection. The lowest concentration of SARS-CoV-2 viral copies this assay can detect is 250 copies / mL. A negative result does not preclude SARS-CoV-2 infection and should not be used as the sole basis for treatment or other patient management decisions.  A negative result may occur with improper specimen collection / handling, submission of specimen other than nasopharyngeal swab, presence of viral mutation(s) within the areas targeted by this assay, and inadequate number of viral copies (<250 copies / mL). A negative result must be combined with clinical observations, patient history, and epidemiological information.  Fact Sheet for Patients:   BoilerBrush.com.cy  Fact Sheet for Healthcare Providers: https://pope.com/  This test is not yet approved or  cleared by the [place] FDA and has been authorized for detection and/or diagnosis of SARS-CoV-2 by FDA under an Emergency Use Authorization (EUA).  This EUA will remain in effect (meaning this test can be used) for the duration of the COVID-19 declaration under Section 564(b)(1) of the Act, 21 U.S.C. section 360bbb-3(b)(1), unless the authorization is terminated or revoked sooner.  Performed at [facility], [address] [place], [place] [address]   Urine culture     Status: Abnormal   Collection Time: 04/28/20  9:31 PM   Specimen: Urine, Clean Catch  Result Value Ref Range Status   Specimen Description   Final    URINE, CLEAN CATCH Performed at [facility], [address], [place], [place] [address]    Special Requests   Final    NONE Performed at [facility], [address] [place], [place] [address]    Culture (A)  Final    <10,000 COLONIES/mL INSIGNIFICANT GROWTH Performed at  [facility], [address], [place], [place] [address]    Report Status 04/30/2020 FINAL  Final  Culture, blood (routine x 2)     Status: None (Preliminary result)   Collection Time: 05/02/20  8:32 AM   Specimen: BLOOD RIGHT HAND  Result Value Ref Range Status   Specimen Description   Final    BLOOD RIGHT HAND BOTTLES DRAWN AEROBIC AND ANAEROBIC   Special Requests Blood Culture adequate volume  Final   Culture   Final    NO GROWTH 3 DAYS Performed at [facility], [address] [place], [place] [address]    Report Status PENDING  Incomplete  Culture, blood (routine x 2)     Status: None (Preliminary result)   Collection Time: 05/02/20  8:36 AM   Specimen: BLOOD RIGHT HAND  Result Value Ref Range Status   Specimen Description   Final    BLOOD RIGHT HAND BOTTLES DRAWN AEROBIC AND ANAEROBIC   Special  Requests Blood Culture adequate volume  Final   Culture   Final    NO GROWTH 3 DAYS Performed at [facility], [address], [place], [place] [address]    Report Status PENDING  Incomplete     Labs: BNP (last 3 results) No results for input(s): BNP in the last 8760 hours. Basic Metabolic Panel: Recent Labs  Lab 04/30/20 0507 04/30/20 0507 05/01/20 0411 05/01/20 0411 05/01/20 1631 05/01/20 1631 05/01/20 1811 05/01/20 1838 05/02/20 0426 05/03/20 0342 05/04/20 0337  NA 141   < > 141   < > 141   < > 143 143 138 139 139  K 4.4   < > 3.3*   < > 4.5   < > 4.6 4.4 3.6 3.4* 3.7  CL 106   < > 106  --  104  --   --   --  107 106 105  CO2 27  --  25  --   --   --   --   --  23 27 28   GLUCOSE 96   < > 99  --  135*  --   --   --  114* 98 96  BUN 16   < > 8  --  10  --   --   --  20 13 7*  CREATININE 0.67   < > 0.63  --  0.50  --   --   --  0.53 0.60 0.49  CALCIUM 8.6*  --  8.4*  --   --   --   --   --  7.5* 8.1* 8.2*  MG 2.1  --  1.9  --   --   --   --   --  1.4* 2.0 1.8   < > = values in this interval not displayed.   Liver Function Tests: Recent Labs  Lab  04/28/20 2035  AST 15  ALT 11  ALKPHOS 86  BILITOT 0.5  PROT 7.7  ALBUMIN 3.0*   No results for input(s): LIPASE, AMYLASE in the last 168 hours. No results for input(s): AMMONIA in the last 168 hours. CBC: Recent Labs  Lab 04/28/20 2035 04/29/20 0706 05/01/20 1129 05/01/20 1631 05/01/20 1838 05/02/20 0426 05/03/20 0342 05/04/20 0337 05/05/20 0628  WBC 20.3*   < > 10.5  --   --  19.1* 13.9* 12.5* 13.3*  NEUTROABS 18.3*  --   --   --   --   --   --   --   --   HGB 12.9   < > 8.1*   < > 9.9* 9.5* 9.0* 9.2* 10.6*  HCT 39.9   < > 25.2*   < > 29.0* 28.3* 27.6* 28.7* 33.2*  MCV 99.8   < > 102.0*  --   --  91.0 93.6 96.0 94.3  PLT 532*   < > 421*  --   --  189 244 294 411*   < > = values in this interval not displayed.   Cardiac Enzymes: No results for input(s): CKTOTAL, CKMB, CKMBINDEX, TROPONINI in the last 168 hours. BNP: Invalid input(s): POCBNP CBG: No results for input(s): GLUCAP in the last 168 hours. D-Dimer No results for input(s): DDIMER in the last 72 hours. Hgb A1c No results for input(s): HGBA1C in the last 72 hours. Lipid Profile No results for input(s): CHOL, HDL, LDLCALC, TRIG, CHOLHDL, LDLDIRECT in the last 72 hours. Thyroid function studies No results for input(s): TSH, T4TOTAL, T3FREE, THYROIDAB in the  last 72 hours.  Invalid input(s): FREET3 Anemia work up No results for input(s): VITAMINB12, FOLATE, FERRITIN, TIBC, IRON, RETICCTPCT in the last 72 hours. Urinalysis    Component Value Date/Time   COLORURINE YELLOW 05/02/2020 0820   APPEARANCEUR CLEAR 05/02/2020 0820   LABSPEC 1.015 05/02/2020 0820   PHURINE 5.5 05/02/2020 0820   GLUCOSEU NEGATIVE 05/02/2020 0820   HGBUR NEGATIVE 05/02/2020 0820   BILIRUBINUR NEGATIVE 05/02/2020 0820   KETONESUR NEGATIVE 05/02/2020 0820   PROTEINUR NEGATIVE 05/02/2020 0820   UROBILINOGEN 0.2 04/22/2014 1810   NITRITE NEGATIVE 05/02/2020 0820   LEUKOCYTESUR NEGATIVE 05/02/2020 0820   Sepsis Labs Invalid  input(s): PROCALCITONIN,  WBC,  LACTICIDVEN Microbiology Recent Results (from the past 240 hour(s))  SARS Coronavirus 2 by RT PCR (hospital order, performed in [facility] hospital lab) Nasopharyngeal Nasopharyngeal Swab     Status: None   Collection Time: 04/28/20  8:20 PM   Specimen: Nasopharyngeal Swab  Result Value Ref Range Status   SARS Coronavirus 2 NEGATIVE NEGATIVE Final    Comment: (NOTE) SARS-CoV-2 target nucleic acids are NOT DETECTED.  The SARS-CoV-2 RNA is generally detectable in upper and lower respiratory specimens during the acute phase of infection. The lowest concentration of SARS-CoV-2 viral copies this assay can detect is 250 copies / mL. A negative result does not preclude SARS-CoV-2 infection and should not be used as the sole basis for treatment or other patient management decisions.  A negative result may occur with improper specimen collection / handling, submission of specimen other than nasopharyngeal swab, presence of viral mutation(s) within the areas targeted by this assay, and inadequate number of viral copies (<250 copies / mL). A negative result must be combined with clinical observations, patient history, and epidemiological information.  Fact Sheet for Patients:   BoilerBrush.com.cy  Fact Sheet for Healthcare Providers: https://pope.com/  This test is not yet approved or  cleared by the [place] FDA and has been authorized for detection and/or diagnosis of SARS-CoV-2 by FDA under an Emergency Use Authorization (EUA).  This EUA will remain in effect (meaning this test can be used) for the duration of the COVID-19 declaration under Section 564(b)(1) of the Act, 21 U.S.C. section 360bbb-3(b)(1), unless the authorization is terminated or revoked sooner.  Performed at [facility], [address] [place], [place] [address]   Urine culture     Status: Abnormal   Collection Time: 04/28/20  9:31 PM    Specimen: Urine, Clean Catch  Result Value Ref Range Status   Specimen Description   Final    URINE, CLEAN CATCH Performed at [facility], [address], [place], [place] [address]    Special Requests   Final    NONE Performed at [facility], [address] [place], [place] [address]    Culture (A)  Final    <10,000 COLONIES/mL INSIGNIFICANT GROWTH Performed at [facility], [address], [place], [place] [address]    Report Status 04/30/2020 FINAL  Final  Culture, blood (routine x 2)     Status: None (Preliminary result)   Collection Time: 05/02/20  8:32 AM   Specimen: BLOOD RIGHT HAND  Result Value Ref Range Status   Specimen Description   Final    BLOOD RIGHT HAND BOTTLES DRAWN AEROBIC AND ANAEROBIC   Special Requests Blood Culture adequate volume  Final   Culture   Final    NO GROWTH 3 DAYS Performed at [facility], [address] [place], [place] [address]  Report Status PENDING  Incomplete  Culture, blood (routine x 2)     Status: None (Preliminary result)   Collection Time: 05/02/20  8:36 AM   Specimen: BLOOD RIGHT HAND  Result Value Ref Range Status   Specimen Description   Final    BLOOD RIGHT HAND BOTTLES DRAWN AEROBIC AND ANAEROBIC   Special Requests Blood Culture adequate volume  Final   Culture   Final    NO GROWTH 3 DAYS Performed at [facility], [address] [place], [place] [address]    Report Status PENDING  Incomplete     Time coordinating discharge: 35 minutes  SIGNED:   Erick Blinks, DO [facility] 05/05/2020, 9:40 AM  If 7PM-7AM, please contact night-coverage [email or web address]

## 2020-05-06 NOTE — Anesthesia Postprocedure Evaluation (Signed)
Anesthesia Post Note  Patient: Nicole Welch  Procedure(s) Performed: IR WITH ANESTHESIA (N/A )     Patient location during evaluation: PACU Anesthesia Type: General Level of consciousness: patient cooperative and awake Pain management: pain level controlled Vital Signs Assessment: post-procedure vital signs reviewed and stable Respiratory status: spontaneous breathing, nonlabored ventilation, respiratory function stable and patient connected to nasal cannula oxygen Cardiovascular status: stable Postop Assessment: no apparent nausea or vomiting Anesthetic complications: no   No complications documented.  Last Vitals:  Vitals:   05/05/20 0800 05/05/20 0805  BP:  (!) 155/74  Pulse: 98   Resp:    Temp:    SpO2: 96%     Last Pain:  Vitals:   05/05/20 0800  TempSrc:   PainSc: 6                  Laurena Valko

## 2020-05-07 ENCOUNTER — Encounter (HOSPITAL_COMMUNITY): Payer: Self-pay | Admitting: Radiology

## 2020-05-07 LAB — CULTURE, BLOOD (ROUTINE X 2)
Culture: NO GROWTH
Culture: NO GROWTH
Special Requests: ADEQUATE
Special Requests: ADEQUATE

## 2020-05-07 NOTE — Progress Notes (Signed)
Room 1.   Thanks!  Katrinka Blazing, MD Gastroenterology and Hepatology [facility]

## 2020-05-07 NOTE — Progress Notes (Signed)
I will sch'd with you as you have more availability -- which room

## 2020-05-09 ENCOUNTER — Encounter (INDEPENDENT_AMBULATORY_CARE_PROVIDER_SITE_OTHER): Payer: Self-pay | Admitting: *Deleted

## 2020-05-09 ENCOUNTER — Other Ambulatory Visit (INDEPENDENT_AMBULATORY_CARE_PROVIDER_SITE_OTHER): Payer: Self-pay | Admitting: *Deleted

## 2020-05-09 NOTE — Progress Notes (Signed)
EGD sch'd 07/31/20, patient aware, instructions mailed

## 2020-05-11 DIAGNOSIS — E663 Overweight: Secondary | ICD-10-CM | POA: Diagnosis not present

## 2020-05-11 DIAGNOSIS — Z6828 Body mass index (BMI) 28.0-28.9, adult: Secondary | ICD-10-CM | POA: Diagnosis not present

## 2020-05-11 DIAGNOSIS — Z1389 Encounter for screening for other disorder: Secondary | ICD-10-CM | POA: Diagnosis not present

## 2020-05-11 DIAGNOSIS — Z0000 Encounter for general adult medical examination without abnormal findings: Secondary | ICD-10-CM | POA: Diagnosis not present

## 2020-05-11 DIAGNOSIS — K922 Gastrointestinal hemorrhage, unspecified: Secondary | ICD-10-CM | POA: Diagnosis not present

## 2020-05-11 DIAGNOSIS — D649 Anemia, unspecified: Secondary | ICD-10-CM | POA: Diagnosis not present

## 2020-05-11 DIAGNOSIS — G894 Chronic pain syndrome: Secondary | ICD-10-CM | POA: Diagnosis not present

## 2020-06-12 ENCOUNTER — Other Ambulatory Visit: Payer: Medicare HMO

## 2020-06-12 DIAGNOSIS — Z20822 Contact with and (suspected) exposure to covid-19: Secondary | ICD-10-CM

## 2020-06-14 DIAGNOSIS — U071 COVID-19: Secondary | ICD-10-CM | POA: Diagnosis not present

## 2020-06-14 LAB — NOVEL CORONAVIRUS, NAA: SARS-CoV-2, NAA: DETECTED — AB

## 2020-06-14 LAB — SARS-COV-2, NAA 2 DAY TAT

## 2020-06-15 ENCOUNTER — Telehealth (HOSPITAL_COMMUNITY): Payer: Self-pay

## 2020-06-19 DIAGNOSIS — G894 Chronic pain syndrome: Secondary | ICD-10-CM | POA: Diagnosis not present

## 2020-06-19 DIAGNOSIS — U071 COVID-19: Secondary | ICD-10-CM | POA: Diagnosis not present

## 2020-06-21 DIAGNOSIS — F4542 Pain disorder with related psychological factors: Secondary | ICD-10-CM | POA: Diagnosis not present

## 2020-06-21 DIAGNOSIS — M810 Age-related osteoporosis without current pathological fracture: Secondary | ICD-10-CM | POA: Diagnosis not present

## 2020-06-21 DIAGNOSIS — G894 Chronic pain syndrome: Secondary | ICD-10-CM | POA: Diagnosis not present

## 2020-06-28 NOTE — Telephone Encounter (Signed)
Done

## 2020-07-19 ENCOUNTER — Other Ambulatory Visit (INDEPENDENT_AMBULATORY_CARE_PROVIDER_SITE_OTHER): Payer: Self-pay | Admitting: *Deleted

## 2020-07-25 ENCOUNTER — Other Ambulatory Visit (INDEPENDENT_AMBULATORY_CARE_PROVIDER_SITE_OTHER): Payer: Self-pay

## 2020-07-26 ENCOUNTER — Encounter (HOSPITAL_COMMUNITY)
Admission: RE | Admit: 2020-07-26 | Discharge: 2020-07-26 | Disposition: A | Discharge status: Home / Self Care | Payer: Medicare HMO | Source: Ambulatory Visit | Attending: Gastroenterology | Admitting: Gastroenterology

## 2020-07-26 NOTE — Patient Instructions (Signed)
ETHELEEN VALTIERRA  07/26/2020     @PREFPERIOPPHARMACY @   Your procedure is scheduled on  07/31/2020.  Report to 13/05/2020 at  [phone]  A.M.  Call this number if you have problems the morning of surgery:  [phone]   Remember:  Follow the diet instructions given to you by the office.                       Take these medicines the morning of surgery with A SIP OF WATER  Flexeril(if needed), levothyroxine, oxycodone(if needed), protinix.    Do not wear jewelry, make-up or nail polish.  Do not wear lotions, powders, or perfumes. Please wear deodorant and brush your teeth.  Do not shave 48 hours prior to surgery.  Men may shave face and neck.  Do not bring valuables to the hospital.  [facility] is not responsible for any belongings or valuables.  Contacts, dentures or bridgework may not be worn into surgery.  Leave your suitcase in the car.  After surgery it may be brought to your room.  For patients admitted to the hospital, discharge time will be determined by your treatment team.  Patients discharged the day of surgery will not be allowed to drive home.   Name and phone number of your driver:   family Special instructions:  DO NOT smoke the morning of your procedure.  Please read over the following fact sheets that you were given. Anesthesia Post-op Instructions and Care and Recovery After Surgery       Upper Endoscopy, Adult, Care After This sheet gives you information about how to care for yourself after your procedure. Your health care provider may also give you more specific instructions. If you have problems or questions, contact your health care provider. What can I expect after the procedure? After the procedure, it is common to have:  A sore throat.  Mild stomach pain or discomfort.  Bloating.  Nausea. Follow these instructions at home:   Follow instructions from your health care provider about what to eat or drink after your  procedure.  Return to your normal activities as told by your health care provider. Ask your health care provider what activities are safe for you.  Take over-the-counter and prescription medicines only as told by your health care provider.  Do not drive for 24 hours if you were given a sedative during your procedure.  Keep all follow-up visits as told by your health care provider. This is important. Contact a health care provider if you have:  A sore throat that lasts longer than one day.  Trouble swallowing. Get help right away if:  You vomit blood or your vomit looks like coffee grounds.  You have: ? A fever. ? Bloody, black, or tarry stools. ? A severe sore throat or you cannot swallow. ? Difficulty breathing. ? Severe pain in your chest or abdomen. Summary  After the procedure, it is common to have a sore throat, mild stomach discomfort, bloating, and nausea.  Do not drive for 24 hours if you were given a sedative during the procedure.  Follow instructions from your health care provider about what to eat or drink after your procedure.  Return to your normal activities as told by your health care provider. This information is not intended to replace advice given to you by your health care provider. Make sure you discuss any questions you have with your health care provider. Document  Revised: 03/02/2018 Document Reviewed: 02/08/2018 Elsevier Patient Education  2020 [employer] Monitored Anesthesia Care, Care After These instructions provide you with information about caring for yourself after your procedure. Your health care provider may also give you more specific instructions. Your treatment has been planned according to current medical practices, but problems sometimes occur. Call your health care provider if you have any problems or questions after your procedure. What can I expect after the procedure? After your procedure, you may:  Feel sleepy for several  hours.  Feel clumsy and have poor balance for several hours.  Feel forgetful about what happened after the procedure.  Have poor judgment for several hours.  Feel nauseous or vomit.  Have a sore throat if you had a breathing tube during the procedure. Follow these instructions at home: For at least 24 hours after the procedure:      Have a responsible adult stay with you. It is important to have someone help care for you until you are awake and alert.  Rest as needed.  Do not: ? Participate in activities in which you could fall or become injured. ? Drive. ? Use heavy machinery. ? Drink alcohol. ? Take sleeping pills or medicines that cause drowsiness. ? Make important decisions or sign legal documents. ? Take care of children on your own. Eating and drinking  Follow the diet that is recommended by your health care provider.  If you vomit, drink water, juice, or soup when you can drink without vomiting.  Make sure you have little or no nausea before eating solid foods. General instructions  Take over-the-counter and prescription medicines only as told by your health care provider.  If you have sleep apnea, surgery and certain medicines can increase your risk for breathing problems. Follow instructions from your health care provider about wearing your sleep device: ? Anytime you are sleeping, including during daytime naps. ? While taking prescription pain medicines, sleeping medicines, or medicines that make you drowsy.  If you smoke, do not smoke without supervision.  Keep all follow-up visits as told by your health care provider. This is important. Contact a health care provider if:  You keep feeling nauseous or you keep vomiting.  You feel light-headed.  You develop a rash.  You have a fever. Get help right away if:  You have trouble breathing. Summary  For several hours after your procedure, you may feel sleepy and have poor judgment.  Have a  responsible adult stay with you for at least 24 hours or until you are awake and alert. This information is not intended to replace advice given to you by your health care provider. Make sure you discuss any questions you have with your health care provider. Document Revised: 12/07/2017 Document Reviewed: 12/30/2015 Elsevier Patient Education  2020 [employer].

## 2020-07-27 ENCOUNTER — Encounter (HOSPITAL_COMMUNITY)
Admission: RE | Admit: 2020-07-27 | Discharge: 2020-07-27 | Disposition: A | Discharge status: Home / Self Care | Payer: Medicare HMO | Source: Ambulatory Visit | Attending: Gastroenterology | Admitting: Gastroenterology

## 2020-07-27 ENCOUNTER — Encounter (HOSPITAL_COMMUNITY): Payer: Self-pay

## 2020-07-27 ENCOUNTER — Other Ambulatory Visit: Payer: Self-pay

## 2020-07-27 ENCOUNTER — Other Ambulatory Visit (HOSPITAL_COMMUNITY): Payer: Medicare HMO

## 2020-07-27 DIAGNOSIS — Z01812 Encounter for preprocedural laboratory examination: Secondary | ICD-10-CM | POA: Insufficient documentation

## 2020-07-27 LAB — BASIC METABOLIC PANEL
Anion gap: 11 (ref 5–15)
BUN: 11 mg/dL (ref 8–23)
CO2: 32 mmol/L (ref 22–32)
Calcium: 9.2 mg/dL (ref 8.9–10.3)
Chloride: 94 mmol/L — ABNORMAL LOW (ref 98–111)
Creatinine, Ser: 0.78 mg/dL (ref 0.44–1.00)
GFR, Estimated: >60 mL/min (ref >60)
Glucose, Bld: 109 mg/dL — ABNORMAL HIGH (ref 70–99)
Potassium: 3.6 mmol/L (ref 3.5–5.1)
Sodium: 137 mmol/L (ref 135–145)

## 2020-07-27 LAB — CBC WITH DIFFERENTIAL/PLATELET
Abs Immature Granulocytes: 0.03 10*3/uL (ref 0.00–0.07)
Basophils Absolute: 0.1 10*3/uL (ref 0.0–0.1)
Basophils Relative: 1 %
Eosinophils Absolute: 0.1 10*3/uL (ref 0.0–0.5)
Eosinophils Relative: 1 %
HCT: 47 % — ABNORMAL HIGH (ref 36.0–46.0)
Hemoglobin: 15.1 g/dL — ABNORMAL HIGH (ref 12.0–15.0)
Immature Granulocytes: 0 %
Lymphocytes Relative: 21 %
Lymphs Abs: 1.9 10*3/uL (ref 0.7–4.0)
MCH: 30.1 pg (ref 26.0–34.0)
MCHC: 32.1 g/dL (ref 30.0–36.0)
MCV: 93.8 fL (ref 80.0–100.0)
Monocytes Absolute: 0.8 10*3/uL (ref 0.1–1.0)
Monocytes Relative: 9 %
Neutro Abs: 5.9 10*3/uL (ref 1.7–7.7)
Neutrophils Relative %: 68 %
Platelets: 366 10*3/uL (ref 150–400)
RBC: 5.01 MIL/uL (ref 3.87–5.11)
RDW: 15.1 % (ref 11.5–15.5)
WBC: 8.6 10*3/uL (ref 4.0–10.5)
nRBC: 0 % (ref 0.0–0.2)

## 2020-07-31 ENCOUNTER — Encounter (HOSPITAL_COMMUNITY)
Admission: RE | Disposition: A | Discharge status: Home / Self Care | Payer: Self-pay | Source: Home / Self Care | Attending: Gastroenterology

## 2020-07-31 ENCOUNTER — Encounter (HOSPITAL_COMMUNITY): Payer: Self-pay | Admitting: Gastroenterology

## 2020-07-31 ENCOUNTER — Ambulatory Visit (HOSPITAL_COMMUNITY): Payer: Medicare HMO | Admitting: Anesthesiology

## 2020-07-31 ENCOUNTER — Ambulatory Visit (HOSPITAL_COMMUNITY)
Admission: RE | Admit: 2020-07-31 | Discharge: 2020-07-31 | Disposition: A | Discharge status: Home / Self Care | Payer: Medicare HMO | Attending: Gastroenterology | Admitting: Gastroenterology

## 2020-07-31 DIAGNOSIS — M542 Cervicalgia: Secondary | ICD-10-CM | POA: Diagnosis not present

## 2020-07-31 DIAGNOSIS — Z8249 Family history of ischemic heart disease and other diseases of the circulatory system: Secondary | ICD-10-CM | POA: Diagnosis not present

## 2020-07-31 DIAGNOSIS — Z09 Encounter for follow-up examination after completed treatment for conditions other than malignant neoplasm: Secondary | ICD-10-CM

## 2020-07-31 DIAGNOSIS — K269 Duodenal ulcer, unspecified as acute or chronic, without hemorrhage or perforation: Secondary | ICD-10-CM | POA: Insufficient documentation

## 2020-07-31 DIAGNOSIS — K3189 Other diseases of stomach and duodenum: Secondary | ICD-10-CM | POA: Insufficient documentation

## 2020-07-31 DIAGNOSIS — I509 Heart failure, unspecified: Secondary | ICD-10-CM | POA: Insufficient documentation

## 2020-07-31 DIAGNOSIS — G8929 Other chronic pain: Secondary | ICD-10-CM | POA: Diagnosis not present

## 2020-07-31 DIAGNOSIS — Z79899 Other long term (current) drug therapy: Secondary | ICD-10-CM | POA: Diagnosis not present

## 2020-07-31 DIAGNOSIS — Z7982 Long term (current) use of aspirin: Secondary | ICD-10-CM | POA: Insufficient documentation

## 2020-07-31 DIAGNOSIS — Z87891 Personal history of nicotine dependence: Secondary | ICD-10-CM | POA: Diagnosis not present

## 2020-07-31 DIAGNOSIS — Z8261 Family history of arthritis: Secondary | ICD-10-CM | POA: Insufficient documentation

## 2020-07-31 HISTORY — PX: ESOPHAGOGASTRODUODENOSCOPY (EGD) WITH PROPOFOL: SHX5813

## 2020-07-31 SURGERY — ESOPHAGOGASTRODUODENOSCOPY (EGD) WITH PROPOFOL
Anesthesia: General

## 2020-07-31 SURGERY — COLONOSCOPY WITH PROPOFOL
Anesthesia: Monitor Anesthesia Care

## 2020-07-31 MED ORDER — LIDOCAINE VISCOUS HCL 2 % MT SOLN
OROMUCOSAL | Status: AC
  Filled 2020-07-31: qty 15

## 2020-07-31 MED ORDER — LIDOCAINE 2% (20 MG/ML) 5 ML SYRINGE
INTRAMUSCULAR | Status: AC
  Filled 2020-07-31: qty 5

## 2020-07-31 MED ORDER — LACTATED RINGERS IV SOLN: INTRAVENOUS | Status: DC | PRN

## 2020-07-31 MED ORDER — PROPOFOL 500 MG/50ML IV EMUL
INTRAVENOUS | Status: DC | PRN
  Administered 2020-07-31: 100 ug/kg/min via INTRAVENOUS

## 2020-07-31 MED ORDER — PANTOPRAZOLE SODIUM 40 MG PO TBEC: 40.0000 mg | DELAYED_RELEASE_TABLET | Freq: Two times a day (BID) | ORAL | 1 refills | Status: DC

## 2020-07-31 MED ORDER — GLYCOPYRROLATE 0.2 MG/ML IJ SOLN
INTRAMUSCULAR | Status: AC
  Filled 2020-07-31: qty 1

## 2020-07-31 MED ORDER — PROPOFOL 10 MG/ML IV BOLUS
INTRAVENOUS | Status: DC | PRN
  Administered 2020-07-31: 80 mg via INTRAVENOUS

## 2020-07-31 MED ORDER — STERILE WATER FOR IRRIGATION IR SOLN
Status: DC | PRN
  Administered 2020-07-31: 100 mL

## 2020-07-31 MED ORDER — LACTATED RINGERS IV SOLN
Freq: Once | INTRAVENOUS | Status: AC
  Administered 2020-07-31: 1000 mL via INTRAVENOUS

## 2020-07-31 MED ORDER — PROPOFOL 10 MG/ML IV BOLUS
INTRAVENOUS | Status: AC
  Filled 2020-07-31: qty 160

## 2020-07-31 MED ORDER — LIDOCAINE VISCOUS HCL 2 % MT SOLN
15.0000 mL | Freq: Once | OROMUCOSAL | Status: AC
  Administered 2020-07-31: 15 mL via OROMUCOSAL

## 2020-07-31 NOTE — Anesthesia Preprocedure Evaluation (Signed)
Anesthesia Evaluation  Patient identified by MRN, date of birth, ID band Patient awake    Reviewed: Allergy & Precautions, NPO status , Patient's Chart, lab work & pertinent test results  History of Anesthesia Complications (+) AWARENESS UNDER ANESTHESIA  Airway Mallampati: II  TM Distance: >3 FB Neck ROM: Full    Dental  (+) Edentulous Upper, Edentulous Lower   Pulmonary shortness of breath and with exertion, former smoker,   Room air o2 89 to 93 Pulmonary exam normal breath sounds clear to auscultation       Cardiovascular Exercise Tolerance: Poor +CHF   Rhythm:Regular Rate:Tachycardia   Moderate sized defect consistent with scar and possible soft tissue attenuation (diaphragm, gut activity) No ischemia Extensive soft tissue overlying heart  This is an intermediate risk study.  Nuclear stress EF: 35%.     Neuro/Psych PSYCHIATRIC DISORDERS Anxiety Depression negative neurological ROS     GI/Hepatic Neg liver ROS, PUD, GI bleed   Endo/Other  negative endocrine ROS  Renal/GU negative Renal ROS     Musculoskeletal  (+) Arthritis , Chronic neck pain   Abdominal   Peds  Hematology   Anesthesia Other Findings 28-Apr-2020 20:43:39 [facility] ROUTINE RECORD Sinus tachycardia Anterolateral infarct, age indeterminate Confirmed by Raeford Razor [id number]) on 04/28/2020 10:07:39 PM  Reproductive/Obstetrics                             Anesthesia Physical  Anesthesia Plan  ASA: III  Anesthesia Plan: General   Post-op Pain Management:    Induction: Intravenous  PONV Risk Score and Plan: TIVA  Airway Management Planned: Nasal Cannula, Natural Airway and Simple Face Mask  Additional Equipment:   Intra-op Plan:   Post-operative Plan:   Informed Consent: I have reviewed the patients History and Physical, chart, labs and discussed the procedure including the risks,  benefits and alternatives for the proposed anesthesia with the patient or authorized representative who has indicated his/her understanding and acceptance.     Dental advisory given  Plan Discussed with: CRNA and Surgeon  Anesthesia Plan Comments:         Anesthesia Quick Evaluation

## 2020-07-31 NOTE — Discharge Instructions (Addendum)
Upper Endoscopy, Adult, Care After This sheet gives you information about how to care for yourself after your procedure. Your health care provider may also give you more specific instructions. If you have problems or questions, contact your health care provider. What can I expect after the procedure? After the procedure, it is common to have:  A sore throat.  Mild stomach pain or discomfort.  Bloating.  Nausea. Follow these instructions at home:   Follow instructions from your health care provider about what to eat or drink after your procedure.  Return to your normal activities as told by your health care provider. Ask your health care provider what activities are safe for you.  Take over-the-counter and prescription medicines only as told by your health care provider.  Do not drive for 24 hours if you were given a sedative during your procedure.  Keep all follow-up visits as told by your health care provider. This is important. Contact a health care provider if you have:  A sore throat that lasts longer than one day.  Trouble swallowing. Get help right away if:  You vomit blood or your vomit looks like coffee grounds.  You have: ? A fever. ? Bloody, black, or tarry stools. ? A severe sore throat or you cannot swallow. ? Difficulty breathing. ? Severe pain in your chest or abdomen. Summary  After the procedure, it is common to have a sore throat, mild stomach discomfort, bloating, and nausea.  Do not drive for 24 hours if you were given a sedative during the procedure.  Follow instructions from your health care provider about what to eat or drink after your procedure.  Return to your normal activities as told by your health care provider. This information is not intended to replace advice given to you by your health care provider. Make sure you discuss any questions you have with your health care provider. Document Revised: 03/02/2018 Document Reviewed:  02/08/2018 Elsevier Patient Education  2020 [employer].  You are being discharged to home.  Resume your previous diet.  Your physician has recommended a repeat upper endoscopy in six months for surveillance.  Continue your present medications, including pantoprazole 40 mg twice a day for 6 more months - will consider decreasing it in next procedure.  Do not take any ibuprofen (including Advil, Motrin or Nuprin), naproxen, or other non-steroidal anti-inflammatory drugs.

## 2020-07-31 NOTE — Transfer of Care (Signed)
Immediate Anesthesia Transfer of Care Note  Patient: Nicole Welch  Procedure(s) Performed: ESOPHAGOGASTRODUODENOSCOPY (EGD) WITH PROPOFOL (N/A )  Patient Location: PACU  Anesthesia Type:General  Level of Consciousness: drowsy and responds to stimulation  Airway & Oxygen Therapy: Patient Spontanous Breathing and Patient connected to nasal cannula oxygen  Post-op Assessment: Report given to RN and Post -op Vital signs reviewed and stable  Post vital signs: Reviewed and stable  Last Vitals:  Vitals Value Taken Time  BP 94/36 07/31/20 0745  Temp    Pulse 74 07/31/20 0746  Resp 12 07/31/20 0746  SpO2 98 % 07/31/20 0746  Vitals shown include unvalidated device data.  Last Pain:  Vitals:   07/31/20 0731  TempSrc:   PainSc: 0-No pain      Patients Stated Pain Goal: 6 (07/31/20 0705)  Complications: No complications documented.

## 2020-07-31 NOTE — Op Note (Signed)
[facility] Patient Name: Nicole Welch Procedure Date: 07/31/2020 7:03 AM MRN: [id number] Date of Birth: 06/10/1956 Attending MD: Katrinka Blazing ,  CSN: [id number] Age: 64 Admit Type: Outpatient Procedure:                Upper GI endoscopy Indications:              Follow-up of duodenal ulcer Providers:                Katrinka Blazing, Sheran Fava, Dyann Ruddle Referring MD:              Medicines:                Monitored Anesthesia Care Complications:            No immediate complications. Estimated Blood Loss:     Estimated blood loss: none. Procedure:                Pre-Anesthesia Assessment:                           - Prior to the procedure, a History and Physical                            was performed, and patient medications, allergies                            and sensitivities were reviewed. The patient's                            tolerance of previous anesthesia was reviewed.                           - The risks and benefits of the procedure and the                            sedation options and risks were discussed with the                            patient. All questions were answered and informed                            consent was obtained.                           - ASA Grade Assessment: III - A patient with severe                            systemic disease.                           After obtaining informed consent, the endoscope was                            passed under direct vision. Throughout the                            procedure, the patient's blood pressure,  pulse, and                            oxygen saturations were monitored continuously. The                            [id number]-[id number] ([id number]) was introduced through the                            mouth, and advanced to the second part of duodenum.                            The upper GI endoscopy was accomplished without                            difficulty. The patient  tolerated the procedure                            well. Scope In: 7:36:13 AM Scope Out: 7:40:34 AM Total Procedure Duration: 0 hours 4 minutes 21 seconds  Findings:      The examined esophagus was normal.      A deformity was found in the gastric antrum - there was presence of a       double pylorus with direct patent connection to the duodenal bulb       through the false lumen. This area was not inflamed and looked better       compared to her last endoscopy. No vessels were identified.      A single localized erosion without bleeding was found in the duodenal       bulb in the area of previous clot location.      The exam of the duodenum was otherwise normal. Impression:               - Normal esophagus.                           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                           - Duodenal erosion without bleeding.                           - No specimens collected. Moderate Sedation:      Per Anesthesia Care Recommendation:           - Discharge patient to home (ambulatory).                           - Resume previous diet.                           - Repeat upper endoscopy in 6 months for                            surveillance.                           -  Continue present medications, including                            pantoprazole 40 mg twice a day for 6 more months -                            will consider decreasing it in next procedure.                           - No ibuprofen, naproxen, or other non-steroidal                            anti-inflammatory drugs. Procedure Code(s):        --- Professional ---                           [phone], GC, Esophagogastroduodenoscopy, flexible,                            transoral; diagnostic, including collection of                            specimen(s) by brushing or washing, when performed                            (separate procedure) Diagnosis Code(s):        ---  Professional ---                           K31.89, Other diseases of stomach and duodenum                           K26.9, Duodenal ulcer, unspecified as acute or                            chronic, without hemorrhage or perforation CPT copyright 2019 American Medical Association. All rights reserved. The codes documented in this report are preliminary and upon coder review may  be revised to meet current compliance requirements. Katrinka Blazing, MD Katrinka Blazing,  07/31/2020 7:50:18 AM This report has been signed electronically. Number of Addenda: 0

## 2020-07-31 NOTE — H&P (Signed)
Nicole Welch is an 64 y.o. female.   Chief Complaint: Follow-up duodenal ulcer  HPI: 64 year old female with past medical history of chronic neck pain, anxiety, depression, heart failure and history of severe duodenal ulcer requiring hospitalization, who came to the hospital today for surveillance of duodenal ulcer.  The patient was admitted to the hospital on 04/28/2020 due to new onset melena in the setting of chronic meloxicam intake.  She was found to have severe iron deficiency anemia, required blood transfusion.  Initially underwent EGD that showed presence of double pylorus and a large clot at the duodenal bulb.  Repeat EGD performed 2 days afterwards showed presence of large clot that could not be removed but there was presence of active oozing.  The patient had to be transferred for management by interventional radiology for emergent embolization of bleeding duodenal vessel.  The patient remained hemodynamically stable after this procedure and did not require any further transfusion after embolization.  She has been compliant with PPI twice daily and has avoided any NSAIDs.  Past Medical History:  Diagnosis Date  . Anxiety   . Back pain   . Chronic neck pain   . DDD (degenerative disc disease), cervical   . Depression   . DJD (degenerative joint disease)   . Gait abnormality   . Heart failure (HCC) 2017  . Osteoporosis     Past Surgical History:  Procedure Laterality Date  . ABDOMINAL HYSTERECTOMY    . CESAREAN SECTION    . ESOPHAGOGASTRODUODENOSCOPY (EGD) WITH PROPOFOL N/A 04/29/2020   Procedure: ESOPHAGOGASTRODUODENOSCOPY (EGD) WITH PROPOFOL;  Surgeon: Malissa Hippo, MD;  [facility];  Service: Endoscopy;  Laterality: N/A;  . ESOPHAGOGASTRODUODENOSCOPY (EGD) WITH PROPOFOL N/A 05/01/2020   Procedure: ESOPHAGOGASTRODUODENOSCOPY (EGD) WITH PROPOFOL;  Surgeon: Dolores Frame, MD;  [facility];  Service: Gastroenterology;  Laterality: N/A;  .  IR ANGIOGRAM SELECTIVE EACH ADDITIONAL VESSEL  05/01/2020  . IR ANGIOGRAM SELECTIVE EACH ADDITIONAL VESSEL  05/01/2020  . IR ANGIOGRAM VISCERAL SELECTIVE  05/01/2020  . IR ANGIOGRAM VISCERAL SELECTIVE  05/01/2020  . IR US GUIDE VASC ACCESS RIGHT  05/01/2020  . RADIOLOGY WITH ANESTHESIA N/A 05/01/2020   Procedure: IR WITH ANESTHESIA;  Surgeon: Radiologist, Medication, MD;  Location: [facility];  Service: Radiology;  Laterality: N/A;  . TUBAL LIGATION      Family History  Problem Relation Age of Onset  . Heart disease Other        family history   . Arthritis Other        family history   . CAD Father        43's   Social History:  reports that she has quit smoking. Her smoking use included cigarettes. She smoked 1.00 pack per day. She has never used smokeless tobacco. She reports that she does not drink alcohol and does not use drugs.  Allergies:  Allergies  Allergen Reactions  . Other Other (See Comments)    Allergy Medication Blurred vision    Medications Prior to Admission  Medication Sig Dispense Refill  . alendronate (FOSAMAX) 70 MG tablet Take 70 mg by mouth once a week. Take with a full glass of water on an empty stomach. Tuesday    . ALPRAZolam (XANAX) 0.5 MG tablet Take 0.5 mg by mouth at bedtime. Additional 0.5 mg if needed during the day    . Ascorbic Acid (VITAMIN C) 1000 MG tablet Take 1,000 mg by mouth daily.    . Biotin 300  MCG TABS Take by mouth daily at 2 PM.    . Cholecalciferol (VITAMIN D3) 50 MCG (2000 UT) TABS Take 2,000 mg by mouth daily.    . cyclobenzaprine (FLEXERIL) 10 MG tablet Take 1 tablet by mouth 2 (two) times daily as needed for muscle spasms.    Marland Kitchen ezetimibe (ZETIA) 10 MG tablet Take 10 mg by mouth at bedtime.    . furosemide (LASIX) 40 MG tablet Take 60 mg by mouth 2 (two) times daily.     Marland Kitchen levothyroxine (SYNTHROID) 25 MCG tablet Take 25 mcg by mouth daily.    Marland Kitchen oxyCODONE-acetaminophen (PERCOCET) 5-325 MG per tablet Take 1 tablet by mouth every 6 (six)  hours as needed for moderate pain (Back pain).     . pantoprazole (PROTONIX) 40 MG tablet Take 1 tablet (40 mg total) by mouth 2 (two) times daily. 120 tablet 1  . potassium chloride (KLOR-CON) 20 MEQ packet Take 30 mEq by mouth daily. 20 tablet 0  . Quercetin 250 MG TABS Take 500 mg by mouth daily.    . simvastatin (ZOCOR) 40 MG tablet Take 40 mg by mouth at bedtime.    . sucralfate (CARAFATE) 1 g tablet Take 1 g by mouth 4 (four) times daily.    . vitamin B-12 (CYANOCOBALAMIN) 1000 MCG tablet Take 1,000 mcg by mouth daily.    . Zinc 30 MG CAPS Take 30 mg by mouth daily.    Marland Kitchen aspirin EC 81 MG EC tablet Take 1 tablet (81 mg total) by mouth daily. (Patient not taking: Reported on 07/25/2020) 30 tablet 0  . nicotine (NICODERM CQ - DOSED IN MG/24 HR) 7 mg/24hr patch Place 1 patch (7 mg total) onto the skin daily. (Patient not taking: Reported on 07/25/2020) 28 patch 0    No results found for this or any previous visit (from the past 48 hour(s)). No results found.  Review of Systems  Constitutional: Negative.   HENT: Negative.   Eyes: Negative.   Respiratory: Negative.   Cardiovascular: Negative.   Gastrointestinal: Negative.   Endocrine: Negative.   Genitourinary: Negative.   Musculoskeletal: Negative.   Skin: Negative.   Allergic/Immunologic: Negative.   Neurological: Negative.   Hematological: Negative.   Psychiatric/Behavioral: Negative.     Blood pressure 138/64, temperature 98 F (36.7 C), temperature source Oral, resp. rate (!) 22, SpO2 93 %. Physical Exam  GENERAL: The patient is AO x3, in no acute distress. HEENT: Head is normocephalic and atraumatic. EOMI are intact. Mouth is well hydrated and without lesions. NECK: Supple. No masses LUNGS: Clear to auscultation. No presence of rhonchi/wheezing/rales. Adequate chest expansion HEART: RRR, normal s1 and s2. ABDOMEN: Soft, nontender, no guarding, no peritoneal signs, and nondistended. BS +. No masses. EXTREMITIES: Without  any cyanosis, clubbing, rash, lesions or edema. NEUROLOGIC: AOx3, no focal motor deficit. SKIN: no jaundice, no rashes  Assessment/Plan 64 year old female with past medical history of chronic neck pain, anxiety, depression, heart failure and history of severe duodenal ulcer requiring hospitalization, who came to the hospital today for surveillance of duodenal ulcer.  We will proceed with EGD today.  Dolores Frame, MD 07/31/2020, 7:26 AM

## 2020-07-31 NOTE — Anesthesia Postprocedure Evaluation (Signed)
Anesthesia Post Note  Patient: Nicole Welch  Procedure(s) Performed: ESOPHAGOGASTRODUODENOSCOPY (EGD) WITH PROPOFOL (N/A )  Patient location during evaluation: PACU Anesthesia Type: General Level of consciousness: awake and alert, oriented and patient cooperative Pain management: pain level controlled Respiratory status: spontaneous breathing, nonlabored ventilation and respiratory function stable Cardiovascular status: blood pressure returned to baseline and stable Postop Assessment: no headache, no backache and no apparent nausea or vomiting Anesthetic complications: no   No complications documented.   Last Vitals:  Vitals:   07/31/20 0705  BP: 138/64  Resp: (!) 22  Temp: 36.7 C  SpO2: 93%    Last Pain:  Vitals:   07/31/20 0731  TempSrc:   PainSc: 0-No pain                 Kimberli Winne

## 2020-08-06 ENCOUNTER — Encounter (HOSPITAL_COMMUNITY): Payer: Self-pay | Admitting: Gastroenterology

## 2020-08-07 DIAGNOSIS — F419 Anxiety disorder, unspecified: Secondary | ICD-10-CM | POA: Diagnosis not present

## 2020-08-07 DIAGNOSIS — Z1331 Encounter for screening for depression: Secondary | ICD-10-CM | POA: Diagnosis not present

## 2020-08-07 DIAGNOSIS — M1991 Primary osteoarthritis, unspecified site: Secondary | ICD-10-CM | POA: Diagnosis not present

## 2020-08-07 DIAGNOSIS — G894 Chronic pain syndrome: Secondary | ICD-10-CM | POA: Diagnosis not present

## 2020-08-07 DIAGNOSIS — Z6827 Body mass index (BMI) 27.0-27.9, adult: Secondary | ICD-10-CM | POA: Diagnosis not present

## 2020-09-03 ENCOUNTER — Ambulatory Visit (INDEPENDENT_AMBULATORY_CARE_PROVIDER_SITE_OTHER): Payer: Medicare HMO | Admitting: Gastroenterology

## 2020-09-20 DIAGNOSIS — M1991 Primary osteoarthritis, unspecified site: Secondary | ICD-10-CM | POA: Diagnosis not present

## 2020-09-20 DIAGNOSIS — G894 Chronic pain syndrome: Secondary | ICD-10-CM | POA: Diagnosis not present

## 2020-09-20 DIAGNOSIS — F419 Anxiety disorder, unspecified: Secondary | ICD-10-CM | POA: Diagnosis not present

## 2020-09-21 DIAGNOSIS — M810 Age-related osteoporosis without current pathological fracture: Secondary | ICD-10-CM | POA: Diagnosis not present

## 2020-09-21 DIAGNOSIS — F4542 Pain disorder with related psychological factors: Secondary | ICD-10-CM | POA: Diagnosis not present

## 2020-09-21 DIAGNOSIS — G894 Chronic pain syndrome: Secondary | ICD-10-CM | POA: Diagnosis not present

## 2020-10-26 DIAGNOSIS — Z1331 Encounter for screening for depression: Secondary | ICD-10-CM | POA: Diagnosis not present

## 2020-10-26 DIAGNOSIS — E663 Overweight: Secondary | ICD-10-CM | POA: Diagnosis not present

## 2020-10-26 DIAGNOSIS — E039 Hypothyroidism, unspecified: Secondary | ICD-10-CM | POA: Diagnosis not present

## 2020-10-26 DIAGNOSIS — Z6828 Body mass index (BMI) 28.0-28.9, adult: Secondary | ICD-10-CM | POA: Diagnosis not present

## 2020-10-26 DIAGNOSIS — G894 Chronic pain syndrome: Secondary | ICD-10-CM | POA: Diagnosis not present

## 2020-11-19 ENCOUNTER — Ambulatory Visit (INDEPENDENT_AMBULATORY_CARE_PROVIDER_SITE_OTHER): Payer: Medicare HMO | Admitting: Gastroenterology

## 2020-11-19 ENCOUNTER — Other Ambulatory Visit: Payer: Self-pay

## 2020-11-19 ENCOUNTER — Encounter (INDEPENDENT_AMBULATORY_CARE_PROVIDER_SITE_OTHER): Payer: Self-pay | Admitting: Gastroenterology

## 2020-11-19 DIAGNOSIS — M810 Age-related osteoporosis without current pathological fracture: Secondary | ICD-10-CM | POA: Diagnosis not present

## 2020-11-19 DIAGNOSIS — K269 Duodenal ulcer, unspecified as acute or chronic, without hemorrhage or perforation: Secondary | ICD-10-CM

## 2020-11-19 DIAGNOSIS — F4542 Pain disorder with related psychological factors: Secondary | ICD-10-CM | POA: Diagnosis not present

## 2020-11-19 DIAGNOSIS — G894 Chronic pain syndrome: Secondary | ICD-10-CM | POA: Diagnosis not present

## 2020-11-19 NOTE — Progress Notes (Unsigned)
Cologuard last time 2021

## 2020-11-19 NOTE — Progress Notes (Signed)
Katrinka Blazing, M.D. Gastroenterology & Hepatology [facility] [address] [place], [place] [address]  Primary Care Physician: Elfredia Nevins, MD [address] [place] [address]  I will communicate my assessment and recommendations to the referring MD via EMR.  Problems: 1. Large duodenal ulcer secondary to Meloxicam intake s/p IR embolization 2. Double pylorus  History of Present Illness: Nicole Welch is a 65 y.o. female with past medical history of chronic neck pain, anxiety, depression, heart failure and history of severe duodenal ulcer s/p IR embolization and c/b double pylorus formation , who presents for follow up of duodenal ulcer.  The patient underwent a repeat EGD on 07/31/2020 for follow-up of her post embolization treatment.  She was found to have a double pylorus with direct patent connection to the duodenal bulb, however this area did not look inflamed and looked better compared to her previous endoscopy.  Also, there was a single erosion in the duodenal bulb but no presence of any ulceration.  The patient was kept on pantoprazole 40 mg twice a day.  The patient denies having any complaints in the morning.  She has avoided taking any NSAIDs and only takes Tylenol when she has pain.  States tolerating well her diet without any nausea, vomiting, fever, chills, hematochezia, melena, hematemesis, abdominal distention, abdominal pain, diarrhea, jaundice, pruritus or weight loss.  Last Colonoscopy: does not remember last but had Cologuard in 2021 which was negative.  Past Medical History: Past Medical History:  Diagnosis Date  . Anxiety   . Back pain   . Chronic neck pain   . DDD (degenerative disc disease), cervical   . Depression   . DJD (degenerative joint disease)   . Gait abnormality   . Heart failure (HCC) 2017  . Osteoporosis     Past Surgical History: Past Surgical History:  Procedure  Laterality Date  . ABDOMINAL HYSTERECTOMY    . CESAREAN SECTION    . ESOPHAGOGASTRODUODENOSCOPY (EGD) WITH PROPOFOL N/A 04/29/2020   Procedure: ESOPHAGOGASTRODUODENOSCOPY (EGD) WITH PROPOFOL;  Surgeon: Malissa Hippo, MD;  [facility];  Service: Endoscopy;  Laterality: N/A;  . ESOPHAGOGASTRODUODENOSCOPY (EGD) WITH PROPOFOL N/A 05/01/2020   Procedure: ESOPHAGOGASTRODUODENOSCOPY (EGD) WITH PROPOFOL;  Surgeon: Dolores Frame, MD;  [facility];  Service: Gastroenterology;  Laterality: N/A;  . ESOPHAGOGASTRODUODENOSCOPY (EGD) WITH PROPOFOL N/A 07/31/2020   Procedure: ESOPHAGOGASTRODUODENOSCOPY (EGD) WITH PROPOFOL;  Surgeon: Dolores Frame, MD;  [facility];  Service: Gastroenterology;  Laterality: N/A;  730 pt tested + on 9/21, does not need covid test < 90 days  . IR ANGIOGRAM SELECTIVE EACH ADDITIONAL VESSEL  05/01/2020  . IR ANGIOGRAM SELECTIVE EACH ADDITIONAL VESSEL  05/01/2020  . IR ANGIOGRAM VISCERAL SELECTIVE  05/01/2020  . IR ANGIOGRAM VISCERAL SELECTIVE  05/01/2020  . IR US GUIDE VASC ACCESS RIGHT  05/01/2020  . RADIOLOGY WITH ANESTHESIA N/A 05/01/2020   Procedure: IR WITH ANESTHESIA;  Surgeon: Radiologist, Medication, MD;  Location: [facility];  Service: Radiology;  Laterality: N/A;  . TUBAL LIGATION      Family History: Family History  Problem Relation Age of Onset  . Heart disease Other        family history   . Arthritis Other        family history   . CAD Father        48's    Social History: Social History   Tobacco Use  Smoking Status Former Smoker  .  Packs/day: 1.00  . Types: Cigarettes  Smokeless Tobacco Never Used   Social History   Substance and Sexual Activity  Alcohol Use No   Social History   Substance and Sexual Activity  Drug Use No    Allergies: Allergies  Allergen Reactions  . Other Other (See Comments)    Allergy Medication Blurred vision    Medications: Current Outpatient Medications   Medication Sig Dispense Refill  . alendronate (FOSAMAX) 70 MG tablet Take 70 mg by mouth once a week. Take with a full glass of water on an empty stomach. Tuesday    . ALPRAZolam (XANAX) 0.5 MG tablet Take 0.5 mg by mouth at bedtime. Additional 0.5 mg if needed during the day    . Ascorbic Acid (VITAMIN C) 1000 MG tablet Take 1,000 mg by mouth daily.    Marland Kitchen aspirin EC 81 MG EC tablet Take 1 tablet (81 mg total) by mouth daily. 30 tablet 0  . Biotin 300 MCG TABS Take by mouth daily at 2 PM.    . Cholecalciferol (VITAMIN D3) 50 MCG (2000 UT) TABS Take 2,000 mg by mouth daily.    . cyclobenzaprine (FLEXERIL) 10 MG tablet Take 1 tablet by mouth 2 (two) times daily as needed for muscle spasms.    Marland Kitchen ezetimibe (ZETIA) 10 MG tablet Take 10 mg by mouth at bedtime.    . furosemide (LASIX) 40 MG tablet Take 60 mg by mouth 2 (two) times daily.     Marland Kitchen levothyroxine (SYNTHROID) 25 MCG tablet Take 25 mcg by mouth daily.    Marland Kitchen oxyCODONE-acetaminophen (PERCOCET) 5-325 MG per tablet Take 1 tablet by mouth every 6 (six) hours as needed for moderate pain (Back pain).     . pantoprazole (PROTONIX) 40 MG tablet Take 1 tablet (40 mg total) by mouth 2 (two) times daily. 180 tablet 1  . potassium chloride (KLOR-CON) 20 MEQ packet Take 30 mEq by mouth daily. 20 tablet 0  . Quercetin 250 MG TABS Take 500 mg by mouth daily.    . simvastatin (ZOCOR) 40 MG tablet Take 40 mg by mouth at bedtime.    . vitamin B-12 (CYANOCOBALAMIN) 1000 MCG tablet Take 1,000 mcg by mouth daily.    . Zinc 30 MG CAPS Take 30 mg by mouth daily.    . nicotine (NICODERM CQ - DOSED IN MG/24 HR) 7 mg/24hr patch Place 1 patch (7 mg total) onto the skin daily. (Patient not taking: No sig reported) 28 patch 0   No current facility-administered medications for this visit.    Review of Systems: GENERAL: negative for malaise, night sweats HEENT: No changes in hearing or vision, no nose bleeds or other nasal problems. NECK: Negative for lumps, goiter,  pain and significant neck swelling RESPIRATORY: Negative for cough, wheezing CARDIOVASCULAR: Negative for chest pain, leg swelling, palpitations, orthopnea GI: SEE HPI MUSCULOSKELETAL: Negative for joint pain or swelling, back pain, and muscle pain. SKIN: Negative for lesions, rash PSYCH: Negative for sleep disturbance, mood disorder and recent psychosocial stressors. HEMATOLOGY Negative for prolonged bleeding, bruising easily, and swollen nodes. ENDOCRINE: Negative for cold or heat intolerance, polyuria, polydipsia and goiter. NEURO: negative for tremor, gait imbalance, syncope and seizures. The remainder of the review of systems is noncontributory.   Physical Exam: BP 111/75 (BP Location: Left Arm, Patient Position: Sitting, Cuff Size: Large)   Pulse (!) 106   Temp 98.4 F (36.9 C) (Oral)   Ht 5\' 5"  (1.651 m)   Wt 164 lb (74.4 kg)  BMI 27.29 kg/m  GENERAL: The patient is AO x3, in no acute distress. HEENT: Head is normocephalic and atraumatic. EOMI are intact. Mouth is well hydrated and without lesions. NECK: Supple. No masses LUNGS: Clear to auscultation. No presence of rhonchi/wheezing/rales. Adequate chest expansion HEART: RRR, normal s1 and s2. ABDOMEN: Soft, nontender, no guarding, no peritoneal signs, and nondistended. BS +. No masses. EXTREMITIES: Without any cyanosis, clubbing, rash, lesions or edema. NEUROLOGIC: AOx3, no focal motor deficit. SKIN: no jaundice, no rashes  Imaging/Labs: as above  I personally reviewed and interpreted the available labs, imaging and endoscopic files.  Impression and Plan: TAMARAH BHULLAR is a 65 y.o. female with past medical history of chronic neck pain, anxiety, depression, heart failure and history of severe duodenal ulcer s/p IR embolization and c/b double pylorus formation , who presents for follow up of duodenal ulcer.  Patient has tolerated well her PPI and has not presented any new symptom.  She had improvement of her  ulceration in her repeat EGD.  I discussed with the patient there was a theoretical risk of having increased bile reflux to the stomach with this double pylorus, which may potentially increase her risk of gastric cancer.  I would like to assess if there is further healing of this double pylorus in the future with a repeat EGD in November 2022.  For now she should continue taking her PPI twice a day as she has been currently taking.  Patient understood and agreed.  - Continue pantoprazole 40 mg BID - Return to clinic in October 2022  All questions were answered.      Dolores Frame, MD Gastroenterology and Hepatology [facility]

## 2020-11-19 NOTE — Patient Instructions (Signed)
Continue pantoprazole 40 mg BID Return to clinic in October 2022

## 2020-11-20 DIAGNOSIS — K269 Duodenal ulcer, unspecified as acute or chronic, without hemorrhage or perforation: Secondary | ICD-10-CM | POA: Insufficient documentation

## 2020-11-23 DIAGNOSIS — F419 Anxiety disorder, unspecified: Secondary | ICD-10-CM | POA: Diagnosis not present

## 2020-11-23 DIAGNOSIS — G894 Chronic pain syndrome: Secondary | ICD-10-CM | POA: Diagnosis not present

## 2020-11-23 DIAGNOSIS — J329 Chronic sinusitis, unspecified: Secondary | ICD-10-CM | POA: Diagnosis not present

## 2020-12-19 DIAGNOSIS — E7849 Other hyperlipidemia: Secondary | ICD-10-CM | POA: Diagnosis not present

## 2020-12-19 DIAGNOSIS — E039 Hypothyroidism, unspecified: Secondary | ICD-10-CM | POA: Diagnosis not present

## 2020-12-26 DIAGNOSIS — G894 Chronic pain syndrome: Secondary | ICD-10-CM | POA: Diagnosis not present

## 2020-12-26 DIAGNOSIS — M1991 Primary osteoarthritis, unspecified site: Secondary | ICD-10-CM | POA: Diagnosis not present

## 2020-12-26 DIAGNOSIS — E063 Autoimmune thyroiditis: Secondary | ICD-10-CM | POA: Diagnosis not present

## 2021-01-11 DIAGNOSIS — S52502A Unspecified fracture of the lower end of left radius, initial encounter for closed fracture: Secondary | ICD-10-CM | POA: Diagnosis not present

## 2021-01-14 DIAGNOSIS — M25532 Pain in left wrist: Secondary | ICD-10-CM | POA: Diagnosis not present

## 2021-01-15 ENCOUNTER — Encounter (INDEPENDENT_AMBULATORY_CARE_PROVIDER_SITE_OTHER): Payer: Self-pay | Admitting: *Deleted

## 2021-01-15 DIAGNOSIS — G894 Chronic pain syndrome: Secondary | ICD-10-CM | POA: Diagnosis not present

## 2021-01-15 DIAGNOSIS — E063 Autoimmune thyroiditis: Secondary | ICD-10-CM | POA: Diagnosis not present

## 2021-01-15 DIAGNOSIS — F419 Anxiety disorder, unspecified: Secondary | ICD-10-CM | POA: Diagnosis not present

## 2021-01-17 DIAGNOSIS — I509 Heart failure, unspecified: Secondary | ICD-10-CM | POA: Diagnosis not present

## 2021-01-17 DIAGNOSIS — S52502A Unspecified fracture of the lower end of left radius, initial encounter for closed fracture: Secondary | ICD-10-CM | POA: Diagnosis not present

## 2021-01-25 DIAGNOSIS — S52522D Torus fracture of lower end of left radius, subsequent encounter for fracture with routine healing: Secondary | ICD-10-CM | POA: Diagnosis not present

## 2021-01-28 DIAGNOSIS — G8918 Other acute postprocedural pain: Secondary | ICD-10-CM | POA: Diagnosis not present

## 2021-01-28 DIAGNOSIS — S52572A Other intraarticular fracture of lower end of left radius, initial encounter for closed fracture: Secondary | ICD-10-CM | POA: Diagnosis not present

## 2021-01-28 DIAGNOSIS — S52522A Torus fracture of lower end of left radius, initial encounter for closed fracture: Secondary | ICD-10-CM | POA: Diagnosis not present

## 2021-01-28 DIAGNOSIS — S52571A Other intraarticular fracture of lower end of right radius, initial encounter for closed fracture: Secondary | ICD-10-CM | POA: Diagnosis not present

## 2021-02-12 DIAGNOSIS — Z4789 Encounter for other orthopedic aftercare: Secondary | ICD-10-CM | POA: Diagnosis not present

## 2021-02-12 DIAGNOSIS — S52522D Torus fracture of lower end of left radius, subsequent encounter for fracture with routine healing: Secondary | ICD-10-CM | POA: Diagnosis not present

## 2021-02-19 DIAGNOSIS — E039 Hypothyroidism, unspecified: Secondary | ICD-10-CM | POA: Diagnosis not present

## 2021-02-19 DIAGNOSIS — E782 Mixed hyperlipidemia: Secondary | ICD-10-CM | POA: Diagnosis not present

## 2021-02-26 DIAGNOSIS — M25532 Pain in left wrist: Secondary | ICD-10-CM | POA: Diagnosis not present

## 2021-02-26 DIAGNOSIS — S52522D Torus fracture of lower end of left radius, subsequent encounter for fracture with routine healing: Secondary | ICD-10-CM | POA: Diagnosis not present

## 2021-02-26 DIAGNOSIS — Z4789 Encounter for other orthopedic aftercare: Secondary | ICD-10-CM | POA: Diagnosis not present

## 2021-03-04 DIAGNOSIS — M1991 Primary osteoarthritis, unspecified site: Secondary | ICD-10-CM | POA: Diagnosis not present

## 2021-03-04 DIAGNOSIS — G894 Chronic pain syndrome: Secondary | ICD-10-CM | POA: Diagnosis not present

## 2021-03-04 DIAGNOSIS — F419 Anxiety disorder, unspecified: Secondary | ICD-10-CM | POA: Diagnosis not present

## 2021-03-06 ENCOUNTER — Other Ambulatory Visit (INDEPENDENT_AMBULATORY_CARE_PROVIDER_SITE_OTHER): Payer: Self-pay | Admitting: Gastroenterology

## 2021-03-08 DIAGNOSIS — M25632 Stiffness of left wrist, not elsewhere classified: Secondary | ICD-10-CM | POA: Diagnosis not present

## 2021-03-15 DIAGNOSIS — M25632 Stiffness of left wrist, not elsewhere classified: Secondary | ICD-10-CM | POA: Diagnosis not present

## 2021-03-22 DIAGNOSIS — M25632 Stiffness of left wrist, not elsewhere classified: Secondary | ICD-10-CM | POA: Diagnosis not present

## 2021-03-29 DIAGNOSIS — M25632 Stiffness of left wrist, not elsewhere classified: Secondary | ICD-10-CM | POA: Diagnosis not present

## 2021-03-29 DIAGNOSIS — S52522D Torus fracture of lower end of left radius, subsequent encounter for fracture with routine healing: Secondary | ICD-10-CM | POA: Diagnosis not present

## 2021-03-29 DIAGNOSIS — Z4789 Encounter for other orthopedic aftercare: Secondary | ICD-10-CM | POA: Diagnosis not present

## 2021-04-05 DIAGNOSIS — M25532 Pain in left wrist: Secondary | ICD-10-CM | POA: Diagnosis not present

## 2021-04-12 DIAGNOSIS — M25532 Pain in left wrist: Secondary | ICD-10-CM | POA: Diagnosis not present

## 2021-04-15 DIAGNOSIS — E063 Autoimmune thyroiditis: Secondary | ICD-10-CM | POA: Diagnosis not present

## 2021-04-15 DIAGNOSIS — M1991 Primary osteoarthritis, unspecified site: Secondary | ICD-10-CM | POA: Diagnosis not present

## 2021-04-15 DIAGNOSIS — G894 Chronic pain syndrome: Secondary | ICD-10-CM | POA: Diagnosis not present

## 2021-04-21 DIAGNOSIS — E782 Mixed hyperlipidemia: Secondary | ICD-10-CM | POA: Diagnosis not present

## 2021-04-21 DIAGNOSIS — E039 Hypothyroidism, unspecified: Secondary | ICD-10-CM | POA: Diagnosis not present

## 2021-04-26 DIAGNOSIS — M25532 Pain in left wrist: Secondary | ICD-10-CM | POA: Diagnosis not present

## 2021-04-30 DIAGNOSIS — S52522D Torus fracture of lower end of left radius, subsequent encounter for fracture with routine healing: Secondary | ICD-10-CM | POA: Diagnosis not present

## 2021-04-30 DIAGNOSIS — Z4789 Encounter for other orthopedic aftercare: Secondary | ICD-10-CM | POA: Diagnosis not present

## 2021-05-22 DIAGNOSIS — E782 Mixed hyperlipidemia: Secondary | ICD-10-CM | POA: Diagnosis not present

## 2021-05-22 DIAGNOSIS — E039 Hypothyroidism, unspecified: Secondary | ICD-10-CM | POA: Diagnosis not present

## 2021-05-23 DIAGNOSIS — M1991 Primary osteoarthritis, unspecified site: Secondary | ICD-10-CM | POA: Diagnosis not present

## 2021-05-23 DIAGNOSIS — E063 Autoimmune thyroiditis: Secondary | ICD-10-CM | POA: Diagnosis not present

## 2021-05-23 DIAGNOSIS — G894 Chronic pain syndrome: Secondary | ICD-10-CM | POA: Diagnosis not present

## 2021-05-24 DIAGNOSIS — M25532 Pain in left wrist: Secondary | ICD-10-CM | POA: Diagnosis not present

## 2021-06-03 ENCOUNTER — Other Ambulatory Visit (INDEPENDENT_AMBULATORY_CARE_PROVIDER_SITE_OTHER): Payer: Self-pay | Admitting: Gastroenterology

## 2021-06-20 DIAGNOSIS — M1991 Primary osteoarthritis, unspecified site: Secondary | ICD-10-CM | POA: Diagnosis not present

## 2021-06-20 DIAGNOSIS — E039 Hypothyroidism, unspecified: Secondary | ICD-10-CM | POA: Diagnosis not present

## 2021-06-20 DIAGNOSIS — Z716 Tobacco abuse counseling: Secondary | ICD-10-CM | POA: Diagnosis not present

## 2021-06-20 DIAGNOSIS — E063 Autoimmune thyroiditis: Secondary | ICD-10-CM | POA: Diagnosis not present

## 2021-06-20 DIAGNOSIS — E782 Mixed hyperlipidemia: Secondary | ICD-10-CM | POA: Diagnosis not present

## 2021-06-20 DIAGNOSIS — F17210 Nicotine dependence, cigarettes, uncomplicated: Secondary | ICD-10-CM | POA: Diagnosis not present

## 2021-06-20 DIAGNOSIS — Z0001 Encounter for general adult medical examination with abnormal findings: Secondary | ICD-10-CM | POA: Diagnosis not present

## 2021-06-20 DIAGNOSIS — G894 Chronic pain syndrome: Secondary | ICD-10-CM | POA: Diagnosis not present

## 2021-06-20 DIAGNOSIS — F419 Anxiety disorder, unspecified: Secondary | ICD-10-CM | POA: Diagnosis not present

## 2021-06-20 DIAGNOSIS — Z79899 Other long term (current) drug therapy: Secondary | ICD-10-CM | POA: Diagnosis not present

## 2021-06-20 DIAGNOSIS — Z1331 Encounter for screening for depression: Secondary | ICD-10-CM | POA: Diagnosis not present

## 2021-06-20 DIAGNOSIS — Z23 Encounter for immunization: Secondary | ICD-10-CM | POA: Diagnosis not present

## 2021-07-08 ENCOUNTER — Ambulatory Visit (INDEPENDENT_AMBULATORY_CARE_PROVIDER_SITE_OTHER): Payer: Medicare HMO | Admitting: Gastroenterology

## 2021-07-08 DIAGNOSIS — G894 Chronic pain syndrome: Secondary | ICD-10-CM | POA: Diagnosis not present

## 2021-07-08 DIAGNOSIS — M1991 Primary osteoarthritis, unspecified site: Secondary | ICD-10-CM | POA: Diagnosis not present

## 2021-07-08 DIAGNOSIS — J329 Chronic sinusitis, unspecified: Secondary | ICD-10-CM | POA: Diagnosis not present

## 2021-07-08 DIAGNOSIS — E063 Autoimmune thyroiditis: Secondary | ICD-10-CM | POA: Diagnosis not present

## 2021-07-12 DIAGNOSIS — Z1211 Encounter for screening for malignant neoplasm of colon: Secondary | ICD-10-CM | POA: Diagnosis not present

## 2021-08-06 ENCOUNTER — Encounter (INDEPENDENT_AMBULATORY_CARE_PROVIDER_SITE_OTHER): Payer: Self-pay | Admitting: Gastroenterology

## 2021-08-06 ENCOUNTER — Other Ambulatory Visit: Payer: Self-pay

## 2021-08-06 ENCOUNTER — Ambulatory Visit (INDEPENDENT_AMBULATORY_CARE_PROVIDER_SITE_OTHER): Payer: Medicare HMO | Admitting: Gastroenterology

## 2021-08-06 VITALS — BP 146/81 | HR 120 | Temp 98.8°F | Ht 62.0 in | Wt 164.3 lb

## 2021-08-06 DIAGNOSIS — K269 Duodenal ulcer, unspecified as acute or chronic, without hemorrhage or perforation: Secondary | ICD-10-CM | POA: Diagnosis not present

## 2021-08-06 NOTE — Progress Notes (Signed)
Referring Provider: Elfredia Nevins, MD Primary Care Physician:  Elfredia Nevins, MD Primary GI Physician: Levon Hedger  Chief Complaint  Patient presents with   Follow-up    Patient here today for a follow up. She states she is doing well with no current Gi issues.   HPI:   Nicole Welch is a 65 y.o. 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   Patient presenting today for follow up of duodenal ulcer/double pylorus.  Patient presented to the ED in aug 2021 with melena and anemia, further workup/EGD during admission revealed clotted blood in gastric antrum from duodenum with presence of double pylorus w/active oozing from site, large clot in ampulla, not removed, patient was transferred to IR for embolization of bleeding vessel. 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 There was a single erosion in duodenal bulb but no presence of ulceration. She has been continued on pantoprazole 40mg  BID. It was recommended that she have repeat surveillance EGD nov 2022 r/t her possible increased risk of gastric cancer due to the double pylorus/risk of increased bile reflux to the stomach.   Today she reports she is doing very well. She monitors her stools closely and has had no rectal bleeding or black stools. Her appetite is very good. She denies abdominal pain, nausea, hematemesis, weakness, fatigue, dizziness or shortness of breath. She denies any issues with reflux, early satiety or weight loss. She is taking pantoprazole 40mg  BID compliantly and continuing to avoid NSAIDs. She has no GI concerns.   Last Colonoscopy:unsure, cologuard in 2021 that was negative Last Endoscopy:07/31/20-  Normal esophagus. 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 - Duodenal erosion without bleeding. No specimens collected  Recommendations:  Due for surveillance EGD  Past Medical History:  Diagnosis Date   Anxiety    Back pain    Chronic neck pain    DDD (degenerative disc disease), cervical    Depression    DJD (degenerative joint disease)    Gait abnormality    Heart failure (HCC) 2017   Osteoporosis     Past Surgical History:  Procedure Laterality Date   ABDOMINAL HYSTERECTOMY     CESAREAN SECTION     ESOPHAGOGASTRODUODENOSCOPY (EGD) WITH PROPOFOL N/A 04/29/2020   Procedure: ESOPHAGOGASTRODUODENOSCOPY (EGD) WITH PROPOFOL;  Surgeon: 2018, MD;  Location: [facility];  Service: Endoscopy;  Laterality: N/A;   ESOPHAGOGASTRODUODENOSCOPY (EGD) WITH PROPOFOL N/A 05/01/2020   Procedure: ESOPHAGOGASTRODUODENOSCOPY (EGD) WITH PROPOFOL;  Surgeon: Malissa Hippo, MD;  Location: [facility];  Service: Gastroenterology;  Laterality: N/A;   ESOPHAGOGASTRODUODENOSCOPY (EGD) WITH PROPOFOL N/A 07/31/2020   Procedure: ESOPHAGOGASTRODUODENOSCOPY (EGD) WITH PROPOFOL;  Surgeon: Dolores Frame, MD;  Location: [facility];  Service: Gastroenterology;  Laterality: N/A;  730 pt tested + on 9/21, does not need covid test < 90 days   IR ANGIOGRAM SELECTIVE EACH ADDITIONAL VESSEL  05/01/2020   IR ANGIOGRAM SELECTIVE EACH ADDITIONAL VESSEL  05/01/2020   IR ANGIOGRAM VISCERAL SELECTIVE  05/01/2020   IR ANGIOGRAM VISCERAL SELECTIVE  05/01/2020   IR 07/01/2020 GUIDE VASC ACCESS RIGHT  05/01/2020   RADIOLOGY WITH ANESTHESIA N/A 05/01/2020   Procedure: IR WITH ANESTHESIA;  Surgeon: Radiologist, Medication, MD;  Location: [facility];  Service: Radiology;  Laterality: N/A;   TUBAL LIGATION      Current Outpatient Medications  Medication Sig Dispense Refill   alendronate (FOSAMAX) 70 MG tablet Take 70 mg by mouth once a week. Take with a full glass of water on an empty  stomach. Tuesday     ALPRAZolam (XANAX) 0.5 MG tablet Take 0.5 mg by mouth at bedtime. Additional 0.5 mg if needed during the day     Ascorbic Acid (VITAMIN C) 1000 MG tablet Take 1,000 mg by mouth daily.     aspirin EC 81 MG EC tablet Take 1 tablet (81 mg total) by mouth daily. 30 tablet 0   Biotin 300 MCG TABS Take by mouth daily at 2 PM.     Cholecalciferol (VITAMIN D3) 50 MCG (2000 UT) TABS Take 2,000 mg by mouth daily.     cyclobenzaprine (FLEXERIL) 10 MG tablet Take 1 tablet by mouth 2 (two) times daily as needed for muscle spasms.     ezetimibe (ZETIA) 10 MG tablet Take 10 mg by mouth at bedtime.     furosemide (LASIX) 40 MG tablet Take 60 mg by mouth 2 (two) times daily.      levothyroxine (SYNTHROID) 25 MCG tablet Take 25 mcg by mouth daily.     nicotine (NICODERM CQ - DOSED IN MG/24 HR) 7 mg/24hr patch Place 1 patch (7 mg total) onto the skin daily. (Patient not taking: No sig reported) 28 patch 0   oxyCODONE-acetaminophen (PERCOCET) 5-325 MG per tablet Take 1 tablet by mouth every 6 (six) hours as needed for moderate pain (Back pain).      pantoprazole (PROTONIX) 40 MG tablet TAKE 1 TABLET BY MOUTH TWICE DAILY. 180 tablet 0   potassium chloride (KLOR-CON) 20 MEQ packet Take 30 mEq by mouth daily. 20 tablet 0   Quercetin 250 MG TABS Take 500 mg by mouth daily.     simvastatin (ZOCOR) 40 MG tablet Take 40 mg by mouth at bedtime.     vitamin B-12 (CYANOCOBALAMIN) 1000 MCG tablet Take 1,000 mcg by mouth daily.     Zinc 30 MG CAPS Take 30 mg by mouth daily.     No current facility-administered medications for this visit.    Allergies as of 08/06/2021 - Review Complete 08/06/2021  Allergen Reaction Noted   Other Other (See Comments) 07/27/2020    Family History  Problem Relation Age of Onset   Heart disease Other        family history    Arthritis Other        family history    CAD Father        68's    Social History   Socioeconomic History   Marital status: Married     Spouse name: Not on file   Number of children: Not on file   Years of education: ged    Highest education level: Not on file  Occupational History   Occupation: Set designer work   Tobacco Use   Smoking status: Former    Packs/day: 1.00    Types: Cigarettes   Smokeless tobacco: Never  Vaping Use   Vaping Use: Never used  Substance and Sexual Activity   Alcohol use: Yes    Comment: occa   Drug use: No   Sexual activity: Not on file  Other Topics Concern   Not on file  Social History Narrative   Not on file   Social Determinants of Health   Financial  Resource Strain: Not on file  Food Insecurity: Not on file  Transportation Needs: Not on file  Physical Activity: Not on file  Stress: Not on file  Social Connections: Not on file   Review of systems General: negative for malaise, night sweats, fever, chills, weight los Neck: Negative for lumps, goiter, pain and significant neck swelling Resp: Negative for cough, wheezing, dyspnea at rest CV: Negative for chest pain, leg swelling, palpitations, orthopnea GI: denies melena, hematochezia, nausea, vomiting, diarrhea, constipation, dysphagia, odyonophagia, early satiety or unintentional weight loss.  MSK: Negative for joint pain or swelling, back pain, and muscle pain. Derm: Negative for itching or rash Psych: Denies depression, anxiety, memory loss, confusion. No homicidal or suicidal ideation.  Heme: Negative for prolonged bleeding, bruising easily, and swollen nodes. Endocrine: Negative for cold or heat intolerance, polyuria, polydipsia and goiter. Neuro: negative for tremor, gait imbalance, syncope and seizures. The remainder of the review of systems is noncontributory.  Physical Exam: BP (!) 146/81 (BP Location: Left Arm, Patient Position: Sitting, Cuff Size: Large)   Pulse (!) 120   Temp 98.8 F (37.1 C) (Oral)   Ht 5\' 2"  (1.575 m)   Wt 164 lb 4.8 oz (74.5 kg)   BMI 30.05 kg/m  General:   Alert and oriented. No  distress noted. Pleasant and cooperative.  Head:  Normocephalic and atraumatic. Eyes:  Conjuctiva clear without scleral icterus. Mouth:  Oral mucosa pink and moist. Good dentition. No lesions. Heart: Normal rate and rhythm, s1 and s2 heart sounds present.  Lungs: Clear lung sounds in all lobes. Respirations equal and unlabored. Abdomen:  +BS, soft, non-tender and non-distended. No rebound or guarding. No HSM or masses noted. Derm: No palmar erythema or jaundice Msk:  Symmetrical without gross deformities. Normal posture. Extremities:  Without edema. Neurologic:  Alert and  oriented x4 Psych:  Alert and cooperative. Normal mood and affect.  Invalid input(s): 6 MONTHS   ASSESSMENT: HISAYO DELOSSANTOS is a 65 y.o. female presenting today for follow up of duodenal ulcer/double pylorus.  Patient is doing very well, she is maintained on PPI BID. She denies any melena, hematochezia, nausea, vomiting, diarrhea, constipation, dysphagia, odyonophagia, early satiety or weight loss. She is continuing to avoid NSAIDs. We discussed proceeding with repeat surveillance EGD to evaluate her double pylorus as she is at an increased risk of gastric cancer r/t this. She is amenable to doing EGD, however, she advised she may have to put it off for a month or two. Given she is asymptomatic, I think this is reasonable, however, she is aware of the importance of having repeat EGD done within the next 1-2 months.   PLAN:  Continue PPI BID 2. Schedule surveillance EGD 3. Continue to avoid NSAIDs   Follow Up: TBD after EGD  Jameriah Trotti L. 70, MSN, APRN, AGNP-C Adult-Gerontology Nurse Practitioner [facility]

## 2021-08-06 NOTE — Patient Instructions (Signed)
Please continue your pantoprazole 40mg  twice a day Please continue to avoid NSAIDs (advil, aleve, naproxen, goody powder, ibuprofen)  You are due for your repeat surveillance EGD to evaluate for healing of your double pylorus, please give a call once you look at your calendar so that we can get you scheduled for this.  Follow up will be determined after your EGD

## 2021-08-30 ENCOUNTER — Other Ambulatory Visit (INDEPENDENT_AMBULATORY_CARE_PROVIDER_SITE_OTHER): Payer: Self-pay | Admitting: Gastroenterology

## 2021-09-26 ENCOUNTER — Other Ambulatory Visit (INDEPENDENT_AMBULATORY_CARE_PROVIDER_SITE_OTHER): Payer: Self-pay

## 2021-09-26 ENCOUNTER — Encounter (INDEPENDENT_AMBULATORY_CARE_PROVIDER_SITE_OTHER): Payer: Self-pay

## 2021-09-26 DIAGNOSIS — K269 Duodenal ulcer, unspecified as acute or chronic, without hemorrhage or perforation: Secondary | ICD-10-CM

## 2021-09-27 DIAGNOSIS — M1991 Primary osteoarthritis, unspecified site: Secondary | ICD-10-CM | POA: Diagnosis not present

## 2021-09-27 DIAGNOSIS — G894 Chronic pain syndrome: Secondary | ICD-10-CM | POA: Diagnosis not present

## 2021-09-27 DIAGNOSIS — J329 Chronic sinusitis, unspecified: Secondary | ICD-10-CM | POA: Diagnosis not present

## 2021-10-03 NOTE — Patient Instructions (Signed)
20    Your procedure is scheduled on: 10/11/2021  Report to [place] Entrance at    6:45 AM.  Call this number if you have problems the morning of surgery: [phone]   Remember:   Follow instructions on letter from office regarding when to stop eating and drinking        No Smoking the day of procedure      Take these medicines the morning of surgery with A SIP OF WATER: levothyroxine, pantoprazole and oxycodone if needed   Do not wear jewelry, make-up or nail polish.  Do not wear lotions, powders, or perfumes. You may wear deodorant.                Do not bring valuables to the hospital.  Contacts, dentures or bridgework may not be worn into surgery.  Leave suitcase in the car. After surgery it may be brought to your room.  For patients admitted to the hospital, checkout time is 11:00 AM the day of discharge.   Patients discharged the day of surgery will not be allowed to drive home. Upper Endoscopy, Adult Upper endoscopy is a procedure to look inside the upper GI (gastrointestinal) tract. The upper GI tract is made up of: The part of the body that moves food from your mouth to your stomach (esophagus). The stomach. The first part of your small intestine (duodenum). This procedure is also called esophagogastroduodenoscopy (EGD) or gastroscopy. In this procedure, your health care provider passes a thin, flexible tube (endoscope) through your mouth and down your esophagus into your stomach. A small camera is attached to the end of the tube. Images from the camera appear on a monitor in the exam room. During this procedure, your health care provider may also remove a small piece of tissue to be sent to a lab and examined under a microscope (biopsy). Your health care provider may do an upper endoscopy to diagnose cancers of the upper GI tract. You may also have this procedure to find the cause of other conditions, such as: Stomach pain. Heartburn. Pain or problems when  swallowing. Nausea and vomiting. Stomach bleeding. Stomach ulcers. Tell a health care provider about: Any allergies you have. All medicines you are taking, including vitamins, herbs, eye drops, creams, and over-the-counter medicines. Any problems you or family members have had with anesthetic medicines. Any blood disorders you have. Any surgeries you have had. Any medical conditions you have. Whether you are pregnant or may be pregnant. What are the risks? Generally, this is a safe procedure. However, problems may occur, including: Infection. Bleeding. Allergic reactions to medicines. A tear or hole (perforation) in the esophagus, stomach, or duodenum. What happens before the procedure? Staying hydrated Follow instructions from your health care provider about hydration, which may include: Up to 4 hours before the procedure - you may continue to drink clear liquids, such as water, clear fruit juice, black coffee, and plain tea.   Medicines Ask your health care provider about: Changing or stopping your regular medicines. This is especially important if you are taking diabetes medicines or blood thinners. Taking medicines such as aspirin and ibuprofen. These medicines can thin your blood. Do not take these medicines unless your health care provider tells you to take them. Taking over-the-counter medicines, vitamins, herbs, and supplements. General instructions Plan to have someone take you home from the hospital or clinic. If you will be going home right after the procedure, plan to have someone with you for 24  hours. Ask your health care provider what steps will be taken to help prevent infection. What happens during the procedure?  An IV will be inserted into one of your veins. You may be given one or more of the following: A medicine to help you relax (sedative). A medicine to numb the throat (local anesthetic). You will lie on your left side on an exam table. Your health care  provider will pass the endoscope through your mouth and down your esophagus. Your health care provider will use the scope to check the inside of your esophagus, stomach, and duodenum. Biopsies may be taken. The endoscope will be removed. The procedure may vary among health care providers and hospitals. What happens after the procedure? Your blood pressure, heart rate, breathing rate, and blood oxygen level will be monitored until you leave the hospital or clinic. Do not drive for 24 hours if you were given a sedative during your procedure. When your throat is no longer numb, you may be given some fluids to drink. It is up to you to get the results of your procedure. Ask your health care provider, or the department that is doing the procedure, when your results will be ready. Summary Upper endoscopy is a procedure to look inside the upper GI tract. During the procedure, an IV will be inserted into one of your veins. You may be given a medicine to help you relax. A medicine will be used to numb your throat. The endoscope will be passed through your mouth and down your esophagus. This information is not intended to replace advice given to you by your health care provider. Make sure you discuss any questions you have with your health care provider. Document Revised: 03/03/2018 Document Reviewed: 02/08/2018 Elsevier Patient Education  [place] After  Please read the instructions outlined below and refer to this sheet in the next few weeks. These discharge instructions provide you with general information on caring for yourself after you leave the hospital. Your doctor may also give you specific instructions. While your treatment has been planned according to the most current medical practices available, unavoidable complications occasionally  occur. If you have any problems or questions after discharge, please call your doctor. HOME CARE INSTRUCTIONS Activity You may resume your regular activity but move at a slower pace for the next 24 hours.  Take frequent rest periods for the next 24 hours.  Walking will help expel (get rid of) the air and reduce the bloated feeling in your abdomen.  No driving for 24 hours (because of the anesthesia (medicine) used during the test).  You may shower.  Do not sign any important legal documents or operate any machinery for 24 hours (because of  the anesthesia used during the test).  Nutrition Drink plenty of fluids.  You may resume your normal diet.  Begin with a light meal and progress to your normal diet.  Avoid alcoholic beverages for 24 hours or as instructed by your caregiver.  Medications You may resume your normal medications unless your caregiver tells you otherwise. What you can expect today You may experience abdominal discomfort such as a feeling of fullness or "gas" pains.  You may experience a sore throat for 2 to 3 days. This is normal. Gargling with salt water may help this.  Follow-up Your doctor will discuss the results of your test with you. SEEK IMMEDIATE MEDICAL CARE IF: You have excessive nausea (feeling sick to your stomach) and/or vomiting.  You have severe abdominal pain and distention (swelling).  You have trouble swallowing.  You have a temperature over 100 F (37.8 C).  You have rectal bleeding or vomiting of blood.  Document Released: 04/22/2004 Document Revised: 08/28/2011 Document Reviewed: 11/03/2007

## 2021-10-08 ENCOUNTER — Encounter (HOSPITAL_COMMUNITY): Payer: Self-pay

## 2021-10-08 ENCOUNTER — Telehealth (INDEPENDENT_AMBULATORY_CARE_PROVIDER_SITE_OTHER): Payer: Self-pay | Admitting: Gastroenterology

## 2021-10-08 ENCOUNTER — Encounter (HOSPITAL_COMMUNITY)
Admission: RE | Admit: 2021-10-08 | Discharge: 2021-10-08 | Disposition: A | Discharge status: Home / Self Care | Payer: Medicare HMO | Source: Ambulatory Visit | Attending: Gastroenterology | Admitting: Gastroenterology

## 2021-10-08 DIAGNOSIS — K269 Duodenal ulcer, unspecified as acute or chronic, without hemorrhage or perforation: Secondary | ICD-10-CM | POA: Insufficient documentation

## 2021-10-08 DIAGNOSIS — Z01812 Encounter for preprocedural laboratory examination: Secondary | ICD-10-CM | POA: Insufficient documentation

## 2021-10-08 LAB — BASIC METABOLIC PANEL
Anion gap: 14 (ref 5–15)
BUN: 13 mg/dL (ref 8–23)
CO2: 30 mmol/L (ref 22–32)
Calcium: 9.1 mg/dL (ref 8.9–10.3)
Chloride: 96 mmol/L — ABNORMAL LOW (ref 98–111)
Creatinine, Ser: 0.74 mg/dL (ref 0.44–1.00)
GFR, Estimated: >60 mL/min (ref >60)
Glucose, Bld: 111 mg/dL — ABNORMAL HIGH (ref 70–99)
Potassium: 3.4 mmol/L — ABNORMAL LOW (ref 3.5–5.1)
Sodium: 140 mmol/L (ref 135–145)

## 2021-10-08 NOTE — Telephone Encounter (Signed)
Spoke to [place], explained Nicole Welch was out and she would be back tomorrow (1/18) and I would let her know to do PA first thing

## 2021-10-08 NOTE — Telephone Encounter (Signed)
Please call [place] at the pre service center at [phone] ext [phone] regarding patient needing a PA

## 2021-10-09 ENCOUNTER — Other Ambulatory Visit (INDEPENDENT_AMBULATORY_CARE_PROVIDER_SITE_OTHER): Payer: Self-pay

## 2021-10-11 ENCOUNTER — Ambulatory Visit (HOSPITAL_COMMUNITY): Payer: Medicare HMO | Admitting: Anesthesiology

## 2021-10-11 ENCOUNTER — Encounter (HOSPITAL_COMMUNITY): Payer: Self-pay | Admitting: Gastroenterology

## 2021-10-11 ENCOUNTER — Encounter (HOSPITAL_COMMUNITY)
Admission: RE | Disposition: A | Discharge status: Home / Self Care | Payer: Self-pay | Source: Home / Self Care | Attending: Gastroenterology

## 2021-10-11 ENCOUNTER — Ambulatory Visit (HOSPITAL_COMMUNITY)
Admission: RE | Admit: 2021-10-11 | Discharge: 2021-10-11 | Disposition: A | Discharge status: Home / Self Care | Payer: Medicare HMO | Attending: Gastroenterology | Admitting: Gastroenterology

## 2021-10-11 DIAGNOSIS — K319 Disease of stomach and duodenum, unspecified: Secondary | ICD-10-CM | POA: Insufficient documentation

## 2021-10-11 DIAGNOSIS — K317 Polyp of stomach and duodenum: Secondary | ICD-10-CM

## 2021-10-11 DIAGNOSIS — I5031 Acute diastolic (congestive) heart failure: Secondary | ICD-10-CM | POA: Diagnosis not present

## 2021-10-11 DIAGNOSIS — K3189 Other diseases of stomach and duodenum: Secondary | ICD-10-CM | POA: Diagnosis not present

## 2021-10-11 DIAGNOSIS — K269 Duodenal ulcer, unspecified as acute or chronic, without hemorrhage or perforation: Secondary | ICD-10-CM

## 2021-10-11 DIAGNOSIS — M1990 Unspecified osteoarthritis, unspecified site: Secondary | ICD-10-CM | POA: Insufficient documentation

## 2021-10-11 DIAGNOSIS — L929 Granulomatous disorder of the skin and subcutaneous tissue, unspecified: Secondary | ICD-10-CM | POA: Diagnosis not present

## 2021-10-11 DIAGNOSIS — E755 Other lipid storage disorders: Secondary | ICD-10-CM | POA: Insufficient documentation

## 2021-10-11 DIAGNOSIS — Z87891 Personal history of nicotine dependence: Secondary | ICD-10-CM | POA: Diagnosis not present

## 2021-10-11 DIAGNOSIS — F419 Anxiety disorder, unspecified: Secondary | ICD-10-CM | POA: Insufficient documentation

## 2021-10-11 DIAGNOSIS — I509 Heart failure, unspecified: Secondary | ICD-10-CM | POA: Insufficient documentation

## 2021-10-11 HISTORY — PX: POLYPECTOMY: SHX5525

## 2021-10-11 HISTORY — PX: ESOPHAGOGASTRODUODENOSCOPY (EGD) WITH PROPOFOL: SHX5813

## 2021-10-11 HISTORY — PX: BIOPSY: SHX5522

## 2021-10-11 SURGERY — ESOPHAGOGASTRODUODENOSCOPY (EGD) WITH PROPOFOL
Anesthesia: General

## 2021-10-11 MED ORDER — LACTATED RINGERS IV SOLN: INTRAVENOUS | Status: DC

## 2021-10-11 MED ORDER — LIDOCAINE HCL (CARDIAC) PF 100 MG/5ML IV SOSY
PREFILLED_SYRINGE | INTRAVENOUS | Status: DC | PRN
  Administered 2021-10-11: 50 mg via INTRAVENOUS

## 2021-10-11 MED ORDER — PROPOFOL 10 MG/ML IV BOLUS
INTRAVENOUS | Status: DC | PRN
Start: 2021-10-11 — End: 2021-10-11
  Administered 2021-10-11: 50 mg via INTRAVENOUS
  Administered 2021-10-11: 100 mg via INTRAVENOUS

## 2021-10-11 MED ORDER — PROPOFOL 500 MG/50ML IV EMUL
INTRAVENOUS | Status: DC | PRN
  Administered 2021-10-11: 150 ug/kg/min via INTRAVENOUS

## 2021-10-11 NOTE — Anesthesia Preprocedure Evaluation (Signed)
Anesthesia Evaluation  Patient identified by MRN, date of birth, ID band Patient awake    Reviewed: Allergy & Precautions, NPO status , Patient's Chart, lab work & pertinent test results  History of Anesthesia Complications (+) AWARENESS UNDER ANESTHESIA  Airway Mallampati: II  TM Distance: >3 FB Neck ROM: Full    Dental  (+) Edentulous Upper, Edentulous Lower   Pulmonary neg pulmonary ROS, with exertion, former smoker,   Room air o2 89 to 93 Pulmonary exam normal breath sounds clear to auscultation       Cardiovascular Exercise Tolerance: Poor +CHF (2017, EF 50-55% with anterior apical HK suspicious for LAD disease)   Rhythm:Regular Rate:Tachycardia   Moderate sized defect consistent with scar and possible soft tissue attenuation (diaphragm, gut activity) No ischemia Extensive soft tissue overlying heart  This is an intermediate risk study.  Nuclear stress EF: 35%.     Neuro/Psych PSYCHIATRIC DISORDERS Anxiety Depression negative neurological ROS     GI/Hepatic negative GI ROS, Neg liver ROS, GI bleed   Endo/Other  negative endocrine ROS  Renal/GU negative Renal ROS     Musculoskeletal  (+) Arthritis , Osteoarthritis,  Chronic neck pain   Abdominal   Peds  Hematology negative hematology ROS (+)   Anesthesia Other Findings 28-Apr-2020 20:43:39 [facility] ROUTINE RECORD Sinus tachycardia Anterolateral infarct, age indeterminate Confirmed by Raeford Razor [phone]) on 04/28/2020 10:07:39 PM  Reproductive/Obstetrics                             Anesthesia Physical  Anesthesia Plan  ASA: 3  Anesthesia Plan: General   Post-op Pain Management:    Induction: Intravenous  PONV Risk Score and Plan: 2 and TIVA  Airway Management Planned: Natural Airway and Nasal Cannula  Additional Equipment:   Intra-op Plan:   Post-operative Plan:   Informed Consent: I have  reviewed the patients History and Physical, chart, labs and discussed the procedure including the risks, benefits and alternatives for the proposed anesthesia with the patient or authorized representative who has indicated his/her understanding and acceptance.     Dental advisory given  Plan Discussed with: CRNA  Anesthesia Plan Comments:         Anesthesia Quick Evaluation

## 2021-10-11 NOTE — Op Note (Signed)
[facility] Patient Name: Nicole Welch Procedure Date: 10/11/2021 8:26 AM MRN: [id number] Date of Birth: 26-Feb-1956 Attending MD: Katrinka Blazing ,  CSN: [id number] Age: 66 Admit Type: Outpatient Procedure:                Upper GI endoscopy Indications:              Follow-up of duodenal ulcer Providers:                Katrinka Blazing, Edrick Kins, RN, Pandora Leiter,                            Technician Referring MD:              Medicines:                Monitored Anesthesia Care Complications:            No immediate complications. Estimated Blood Loss:     Estimated blood loss: none. Procedure:                Pre-Anesthesia Assessment:                           - Prior to the procedure, a History and Physical                            was performed, and patient medications, allergies                            and sensitivities were reviewed. The patient's                            tolerance of previous anesthesia was reviewed.                           - The risks and benefits of the procedure and the                            sedation options and risks were discussed with the                            patient. All questions were answered and informed                            consent was obtained.                           - ASA Grade Assessment: II - A patient with mild                            systemic disease.                           After obtaining informed consent, the endoscope was                            passed under direct vision. Throughout the  procedure, the patient's blood pressure, pulse, and                            oxygen saturations were monitored continuously. The                            [id number]-[id number] ([id number]) scope was introduced through the                            mouth, and advanced to the second part of duodenum.                            The upper GI endoscopy was accomplished without                             difficulty. The patient tolerated the procedure                            well. Scope In: 8:32:26 AM Scope Out: 8:42:36 AM Total Procedure Duration: 0 hours 10 minutes 10 seconds  Findings:      The examined esophagus was normal.      A single 4 mm sessile polyp with no bleeding and no stigmata of recent       bleeding was found in the gastric body. The polyp was removed with a       cold snare. Resection and retrieval were complete.      An abnormal gastric outlet was found in the gastric antrum - there was       presence of a double pylorus with direct patent connection to the       duodenal bulb through the false lumen (this was widely open). This area       was not inflamed and looked completely healed. Biopsies from the scarred       area were taken with a cold forceps for histology.      The exam of the duodenum was otherwise normal. Impression:               - Normal esophagus.                           - A single gastric polyp. Resected and retrieved.                           - Double pylorus. Biopsied. Moderate Sedation:      Per Anesthesia Care Recommendation:           - Discharge patient to home (ambulatory).                           - Resume previous diet.                           - Await pathology results.                           - Repeat upper endoscopy for surveillance based on  pathology results, possibly in 2 years. Procedure Code(s):        --- Professional ---                           [phone], Esophagogastroduodenoscopy, flexible,                            transoral; with removal of tumor(s), polyp(s), or                            other lesion(s) by snare technique                           [id number], 59, Esophagogastroduodenoscopy, flexible,                            transoral; with biopsy, single or multiple Diagnosis Code(s):        --- Professional ---                           K31.7, Polyp of stomach and duodenum                            K31.89, Other diseases of stomach and duodenum                           K26.9, Duodenal ulcer, unspecified as acute or                            chronic, without hemorrhage or perforation CPT copyright 2019 American Medical Association. All rights reserved. The codes documented in this report are preliminary and upon coder review may  be revised to meet current compliance requirements. Katrinka Blazing, MD Katrinka Blazing,  10/11/2021 8:55:38 AM This report has been signed electronically. Number of Addenda: 0

## 2021-10-11 NOTE — H&P (Signed)
Nicole Welch is an 66 y.o. female.   Chief Complaint: Follow-up of double pylorus HPI: Nicole Welch is a 66 y.o. female with past medical history of chronic neck pain, anxiety, depression, HF and hx of severe duodenal ulcers s/p IR embolization and c/b double pylorus formation, coming for surveillance of her dog pylorus.  Patient denies having any symptoms and states feeling fine.  Denies taking any NSAIDs.  Had her last EGD in November 2022 with presence of healed ulcers and double  pylorus.  Past Medical History:  Diagnosis Date   Anxiety    Back pain    Chronic neck pain    DDD (degenerative disc disease), cervical    Depression    DJD (degenerative joint disease)    Gait abnormality    Heart failure (HCC) 2017   Osteoporosis     Past Surgical History:  Procedure Laterality Date   ABDOMINAL HYSTERECTOMY     CESAREAN SECTION     ESOPHAGOGASTRODUODENOSCOPY (EGD) WITH PROPOFOL N/A 04/29/2020   Procedure: ESOPHAGOGASTRODUODENOSCOPY (EGD) WITH PROPOFOL;  Surgeon: Malissa Hippo, MD;  Location: [facility];  Service: Endoscopy;  Laterality: N/A;   ESOPHAGOGASTRODUODENOSCOPY (EGD) WITH PROPOFOL N/A 05/01/2020   Procedure: ESOPHAGOGASTRODUODENOSCOPY (EGD) WITH PROPOFOL;  Surgeon: Dolores Frame, MD;  Location: [facility];  Service: Gastroenterology;  Laterality: N/A;   ESOPHAGOGASTRODUODENOSCOPY (EGD) WITH PROPOFOL N/A 07/31/2020   Castaneda: normal esophagus, double pylorus without stigmata of recent bleeding, duodenal erosion w/o bleeding, no specimens   HERNIA REPAIR Right    IR ANGIOGRAM SELECTIVE EACH ADDITIONAL VESSEL  05/01/2020   IR ANGIOGRAM SELECTIVE EACH ADDITIONAL VESSEL  05/01/2020   IR ANGIOGRAM VISCERAL SELECTIVE  05/01/2020   IR ANGIOGRAM VISCERAL SELECTIVE  05/01/2020   IR US GUIDE VASC ACCESS RIGHT  05/01/2020   RADIOLOGY WITH ANESTHESIA N/A 05/01/2020   Procedure: IR WITH ANESTHESIA;  Surgeon: Radiologist, Medication, MD;  Location: [facility]  OR;  Service: Radiology;  Laterality: N/A;   TUBAL LIGATION      Family History  Problem Relation Age of Onset   Heart disease Other        family history    Arthritis Other        family history    CAD Father        10's   Social History:  reports that she has quit smoking. Her smoking use included cigarettes. She smoked an average of 1 pack per day. She has never used smokeless tobacco. She reports current alcohol use. She reports that she does not use drugs.  Allergies:  Allergies  Allergen Reactions   Other Other (See Comments)    Allergy Medication Blurred vision    Medications Prior to Admission  Medication Sig Dispense Refill   alendronate (FOSAMAX) 70 MG tablet Take 70 mg by mouth every Tuesday. Take with a full glass of water on an empty stomach. Tuesday     ALPRAZolam (XANAX) 0.5 MG tablet Take 0.5 mg by mouth at bedtime. Additional 0.5 mg if needed during the day     Ascorbic Acid (VITAMIN C) 1000 MG tablet Take 1,000 mg by mouth daily.     aspirin EC 81 MG EC tablet Take 1 tablet (81 mg total) by mouth daily. 30 tablet 0   Biotin 68127 MCG TABS Take 10,000 mcg by mouth daily. Hair Skin and Nails     cholecalciferol (VITAMIN D) 25 MCG (1000 UNIT) tablet Take 1,000 mg by mouth daily.     docusate  sodium (COLACE) 100 MG capsule Take 300 mg by mouth at bedtime.     ezetimibe (ZETIA) 10 MG tablet Take 10 mg by mouth at bedtime.     furosemide (LASIX) 40 MG tablet Take 60 mg by mouth 2 (two) times daily.      levothyroxine (SYNTHROID) 25 MCG tablet Take 25 mcg by mouth daily before breakfast.     Menthol, Topical Analgesic, (BIOFREEZE) 4 % GEL Apply 1 application topically daily as needed (pain).     oxyCODONE-acetaminophen (PERCOCET) 5-325 MG per tablet Take 1 tablet by mouth every 6 (six) hours as needed for moderate pain (Back pain).      pantoprazole (PROTONIX) 40 MG tablet TAKE 1 TABLET BY MOUTH TWICE DAILY. 180 tablet 3   potassium chloride (KLOR-CON) 20 MEQ packet  Take 30 mEq by mouth daily. 20 tablet 0   Quercetin 500 MG CAPS Take 500 mg by mouth daily.     simvastatin (ZOCOR) 40 MG tablet Take 40 mg by mouth at bedtime.     sucralfate (CARAFATE) 1 g tablet Take 1 g by mouth 4 (four) times daily -  with meals and at bedtime.     Wheat Dextrin (BENEFIBER) CHEW Chew 1 each by mouth 3 (three) times a week.     Zinc 30 MG CAPS Take 30 mg by mouth daily.      No results found for this or any previous visit (from the past 48 hour(s)). No results found.  Review of Systems  Constitutional: Negative.   HENT: Negative.    Eyes: Negative.   Respiratory: Negative.    Cardiovascular: Negative.   Gastrointestinal: Negative.   Endocrine: Negative.   Genitourinary: Negative.   Musculoskeletal: Negative.   Skin: Negative.   Allergic/Immunologic: Negative.   Neurological: Negative.   Hematological: Negative.   Psychiatric/Behavioral: Negative.     Blood pressure (!) 130/55, pulse 88, temperature 98 F (36.7 C), temperature source Oral, resp. rate 18, SpO2 96 %. Physical Exam  GENERAL: The patient is AO x3, in no acute distress. HEENT: Head is normocephalic and atraumatic. EOMI are intact. Mouth is well hydrated and without lesions. NECK: Supple. No masses LUNGS: Clear to auscultation. No presence of rhonchi/wheezing/rales. Adequate chest expansion HEART: RRR, normal s1 and s2. ABDOMEN: Soft, nontender, no guarding, no peritoneal signs, and nondistended. BS +. No masses. EXTREMITIES: Without any cyanosis, clubbing, rash, lesions or edema. NEUROLOGIC: AOx3, no focal motor deficit. SKIN: no jaundice, no rashes  Assessment/Plan Nicole Welch is a 66 y.o. female with past medical history of chronic neck pain, anxiety, depression, HF and hx of severe duodenal ulcers s/p IR embolization and c/b double pylorus formation, coming for surveillance of her dog pylorus.  We will proceed with EGD.  Dolores Frame, MD 10/11/2021, 8:15 AM

## 2021-10-11 NOTE — Anesthesia Postprocedure Evaluation (Signed)
Anesthesia Post Note  Patient: Benedicta Sultan Feagan  Procedure(s) Performed: ESOPHAGOGASTRODUODENOSCOPY (EGD) WITH PROPOFOL BIOPSY POLYPECTOMY  Patient location during evaluation: Endoscopy Anesthesia Type: General Level of consciousness: awake and alert Pain management: pain level controlled Vital Signs Assessment: post-procedure vital signs reviewed and stable Respiratory status: spontaneous breathing, nonlabored ventilation, respiratory function stable and patient connected to nasal cannula oxygen Cardiovascular status: blood pressure returned to baseline and stable Postop Assessment: no apparent nausea or vomiting Anesthetic complications: no   No notable events documented.   Last Vitals:  Vitals:   10/11/21 0745 10/11/21 0850  BP: (!) 130/55 (!) 140/58  Pulse: 88 82  Resp: 18 18  Temp: 36.7 C 36.6 C  SpO2: 96% 98%    Last Pain:  Vitals:   10/11/21 0850  TempSrc: Oral  PainSc: 0-No pain                 Glynis Smiles

## 2021-10-11 NOTE — Anesthesia Procedure Notes (Signed)
Date/Time: 10/11/2021 8:34 AM Performed by: Julian Reil, CRNA Pre-anesthesia Checklist: Patient identified, Emergency Drugs available, Suction available and Patient being monitored Patient Re-evaluated:Patient Re-evaluated prior to induction Oxygen Delivery Method: Nasal cannula Induction Type: IV induction Placement Confirmation: positive ETCO2

## 2021-10-11 NOTE — Transfer of Care (Signed)
Immediate Anesthesia Transfer of Care Note  Patient: Nicole Welch  Procedure(s) Performed: ESOPHAGOGASTRODUODENOSCOPY (EGD) WITH PROPOFOL BIOPSY POLYPECTOMY  Patient Location: Short Stay  Anesthesia Type:General  Level of Consciousness: awake and oriented  Airway & Oxygen Therapy: Patient Spontanous Breathing  Post-op Assessment: Report given to RN and Post -op Vital signs reviewed and stable  Post vital signs: Reviewed and stable  Last Vitals:  Vitals Value Taken Time  BP    Temp    Pulse    Resp    SpO2      Last Pain:  Vitals:   10/11/21 0828  TempSrc:   PainSc: 0-No pain         Complications: No notable events documented.

## 2021-10-14 LAB — SURGICAL PATHOLOGY

## 2021-10-14 NOTE — Progress Notes (Signed)
__Agree with the path finding of  is a clinically significant diagnosis ulcer

## 2021-10-15 ENCOUNTER — Encounter (HOSPITAL_COMMUNITY): Payer: Self-pay | Admitting: Gastroenterology

## 2021-10-25 DIAGNOSIS — F4321 Adjustment disorder with depressed mood: Secondary | ICD-10-CM | POA: Diagnosis not present

## 2021-10-25 DIAGNOSIS — J209 Acute bronchitis, unspecified: Secondary | ICD-10-CM | POA: Diagnosis not present

## 2021-10-25 DIAGNOSIS — F419 Anxiety disorder, unspecified: Secondary | ICD-10-CM | POA: Diagnosis not present

## 2021-10-25 DIAGNOSIS — E039 Hypothyroidism, unspecified: Secondary | ICD-10-CM | POA: Diagnosis not present

## 2022-01-08 DIAGNOSIS — M1991 Primary osteoarthritis, unspecified site: Secondary | ICD-10-CM | POA: Diagnosis not present

## 2022-01-08 DIAGNOSIS — F419 Anxiety disorder, unspecified: Secondary | ICD-10-CM | POA: Diagnosis not present

## 2022-01-08 DIAGNOSIS — G894 Chronic pain syndrome: Secondary | ICD-10-CM | POA: Diagnosis not present

## 2022-02-12 DIAGNOSIS — G894 Chronic pain syndrome: Secondary | ICD-10-CM | POA: Diagnosis not present

## 2022-03-06 DIAGNOSIS — G894 Chronic pain syndrome: Secondary | ICD-10-CM | POA: Diagnosis not present

## 2022-03-06 DIAGNOSIS — F330 Major depressive disorder, recurrent, mild: Secondary | ICD-10-CM | POA: Diagnosis not present

## 2022-03-06 DIAGNOSIS — F419 Anxiety disorder, unspecified: Secondary | ICD-10-CM | POA: Diagnosis not present

## 2022-03-06 DIAGNOSIS — Z6828 Body mass index (BMI) 28.0-28.9, adult: Secondary | ICD-10-CM | POA: Diagnosis not present

## 2022-03-06 DIAGNOSIS — E063 Autoimmune thyroiditis: Secondary | ICD-10-CM | POA: Diagnosis not present

## 2022-04-18 DIAGNOSIS — H6123 Impacted cerumen, bilateral: Secondary | ICD-10-CM | POA: Diagnosis not present

## 2022-04-18 DIAGNOSIS — M5030 Other cervical disc degeneration, unspecified cervical region: Secondary | ICD-10-CM | POA: Diagnosis not present

## 2022-04-18 DIAGNOSIS — E663 Overweight: Secondary | ICD-10-CM | POA: Diagnosis not present

## 2022-04-18 DIAGNOSIS — G894 Chronic pain syndrome: Secondary | ICD-10-CM | POA: Diagnosis not present

## 2022-04-18 DIAGNOSIS — E063 Autoimmune thyroiditis: Secondary | ICD-10-CM | POA: Diagnosis not present

## 2022-04-18 DIAGNOSIS — F330 Major depressive disorder, recurrent, mild: Secondary | ICD-10-CM | POA: Diagnosis not present

## 2022-04-18 DIAGNOSIS — Z6828 Body mass index (BMI) 28.0-28.9, adult: Secondary | ICD-10-CM | POA: Diagnosis not present

## 2022-04-18 DIAGNOSIS — E782 Mixed hyperlipidemia: Secondary | ICD-10-CM | POA: Diagnosis not present

## 2022-04-24 ENCOUNTER — Other Ambulatory Visit: Payer: Self-pay | Admitting: Internal Medicine

## 2022-04-24 DIAGNOSIS — Z1231 Encounter for screening mammogram for malignant neoplasm of breast: Secondary | ICD-10-CM

## 2022-05-09 ENCOUNTER — Ambulatory Visit
Admission: RE | Admit: 2022-05-09 | Discharge: 2022-05-09 | Disposition: A | Discharge status: Home / Self Care | Payer: Medicare HMO | Source: Ambulatory Visit | Attending: Internal Medicine | Admitting: Internal Medicine

## 2022-05-09 DIAGNOSIS — Z1231 Encounter for screening mammogram for malignant neoplasm of breast: Secondary | ICD-10-CM | POA: Diagnosis not present

## 2022-05-13 ENCOUNTER — Other Ambulatory Visit: Payer: Self-pay | Admitting: Internal Medicine

## 2022-05-13 DIAGNOSIS — R928 Other abnormal and inconclusive findings on diagnostic imaging of breast: Secondary | ICD-10-CM

## 2022-05-27 ENCOUNTER — Ambulatory Visit
Admission: RE | Admit: 2022-05-27 | Discharge: 2022-05-27 | Disposition: A | Discharge status: Home / Self Care | Payer: Medicare HMO | Source: Ambulatory Visit | Attending: Internal Medicine | Admitting: Internal Medicine

## 2022-05-27 DIAGNOSIS — R928 Other abnormal and inconclusive findings on diagnostic imaging of breast: Secondary | ICD-10-CM

## 2022-05-27 DIAGNOSIS — N6314 Unspecified lump in the right breast, lower inner quadrant: Secondary | ICD-10-CM | POA: Diagnosis not present

## 2022-05-28 ENCOUNTER — Other Ambulatory Visit (INDEPENDENT_AMBULATORY_CARE_PROVIDER_SITE_OTHER): Payer: Self-pay | Admitting: Gastroenterology

## 2022-05-30 DIAGNOSIS — M5030 Other cervical disc degeneration, unspecified cervical region: Secondary | ICD-10-CM | POA: Diagnosis not present

## 2022-05-30 DIAGNOSIS — Z6827 Body mass index (BMI) 27.0-27.9, adult: Secondary | ICD-10-CM | POA: Diagnosis not present

## 2022-05-30 DIAGNOSIS — G894 Chronic pain syndrome: Secondary | ICD-10-CM | POA: Diagnosis not present

## 2022-05-30 DIAGNOSIS — M810 Age-related osteoporosis without current pathological fracture: Secondary | ICD-10-CM | POA: Diagnosis not present

## 2022-06-12 DIAGNOSIS — G894 Chronic pain syndrome: Secondary | ICD-10-CM | POA: Diagnosis not present

## 2022-06-12 DIAGNOSIS — M5030 Other cervical disc degeneration, unspecified cervical region: Secondary | ICD-10-CM | POA: Diagnosis not present

## 2022-06-12 DIAGNOSIS — J329 Chronic sinusitis, unspecified: Secondary | ICD-10-CM | POA: Diagnosis not present

## 2022-06-12 DIAGNOSIS — M1991 Primary osteoarthritis, unspecified site: Secondary | ICD-10-CM | POA: Diagnosis not present

## 2022-07-04 IMAGING — XA IR ANGIO/VISCERAL SELECTIVE EA VESSEL WO/W FLUSH
13 of 20 series · 13 of 24 positions shown · IV contrast (IODINE)
Comparison: none

INDICATION: Duodenal ulcer, bleeding, incomplete control with endoscopic
maneuvers. hypotensive requiring pressors.

[Series 1: body 4 care · 1 of 5 slices shown (1 of 13)]
[im 1/5]
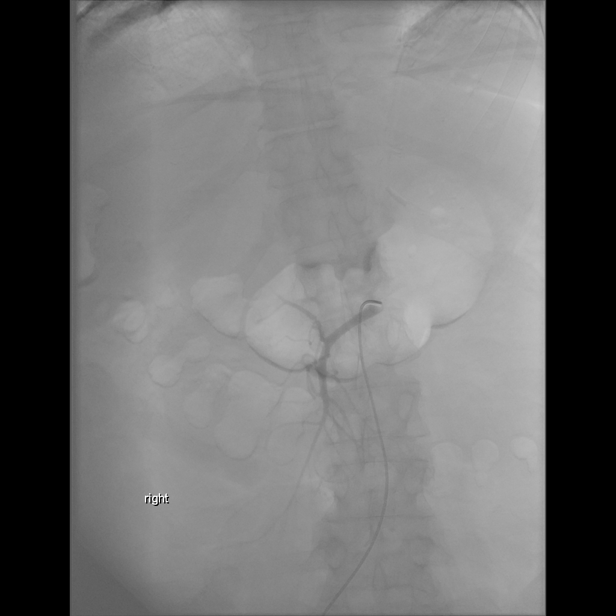

[Series 3: body 4 care · 1 of 7 slices shown (2 of 13)]
[im 1/7]
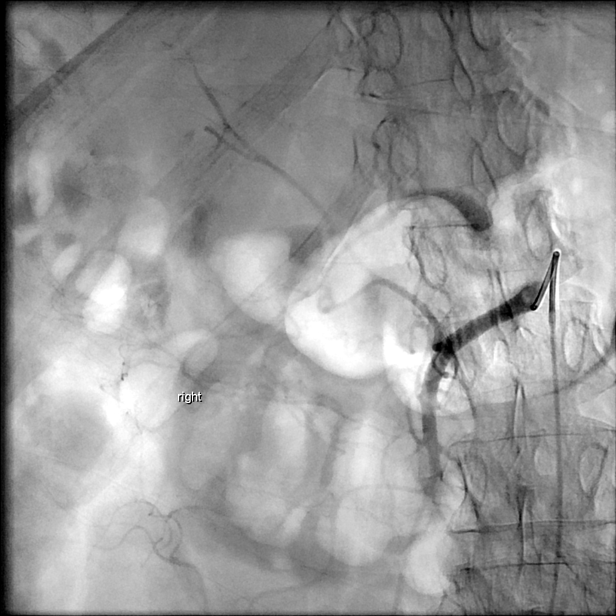

[Series 6: body 4 care · 1 of 11 slices shown (3 of 13)]
[im 1/11]
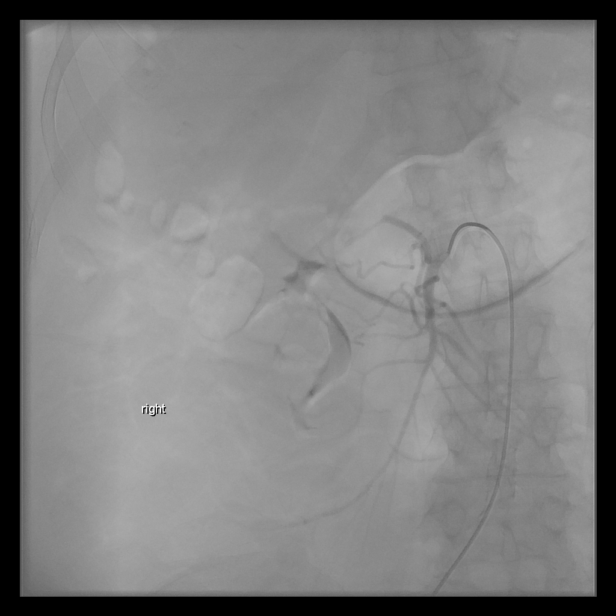

[Series 8: body 4 care · 1 of 6 slices shown (4 of 13)]
[im 1/6]
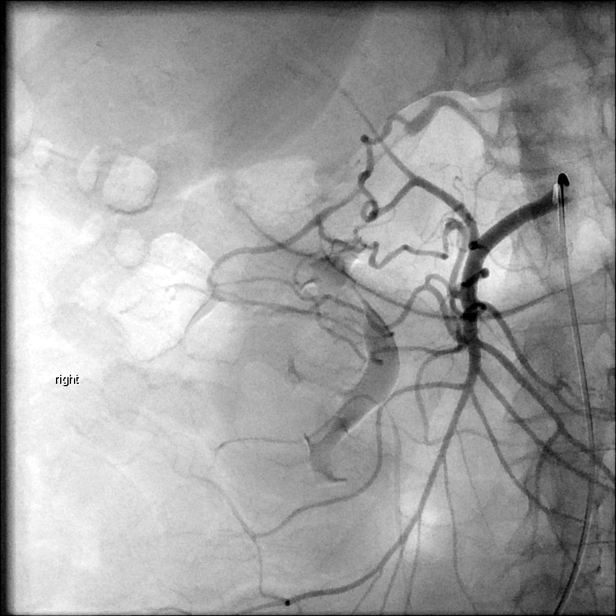

[Series 14: body 4 care · 1 of 7 slices shown (5 of 13)]
[im 1/7]
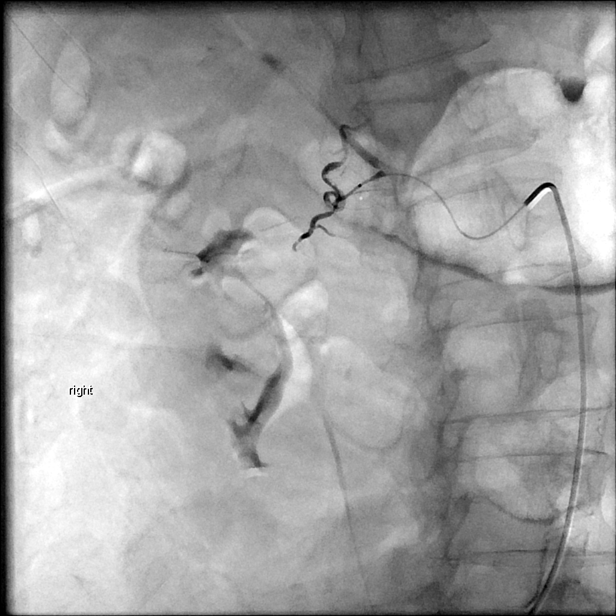

[Series 16: body 4 care · 1 of 17 slices shown (6 of 13)]
[im 1/17]
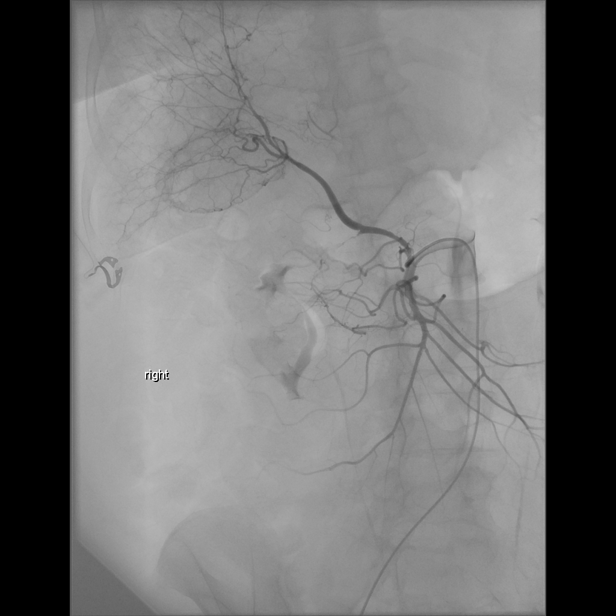

[Series 20: body 4 care · 1 of 11 slices shown (7 of 13)]
[im 11/11]
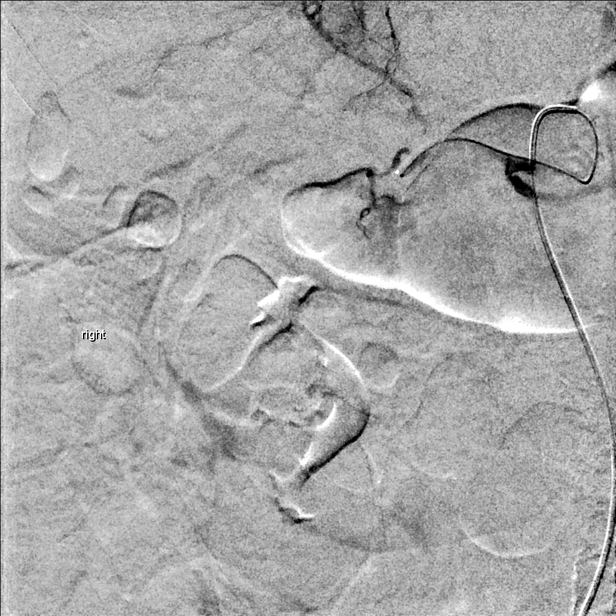

[Series 21: body 4 care · 1 of 8 slices shown (8 of 13)]
[im 1/8]
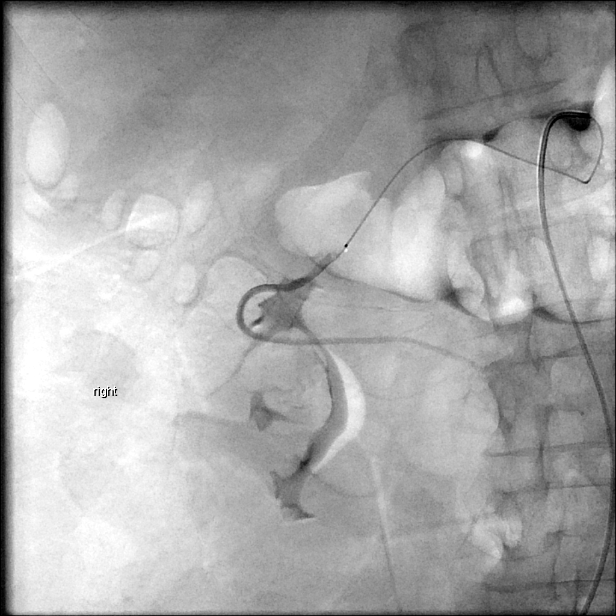

[Series 22: body 4 care · 1 of 12 slices shown (9 of 13)]
[im 12/12]
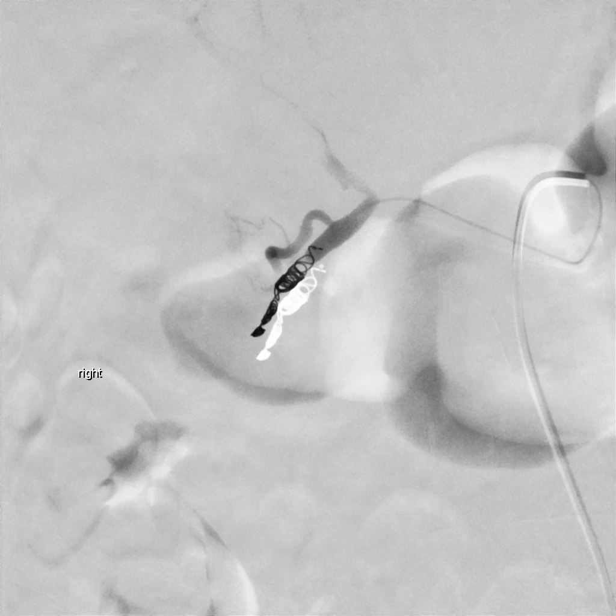

[Series 23: body 4 care · 1 of 14 slices shown (10 of 13)]
[im 14/14]
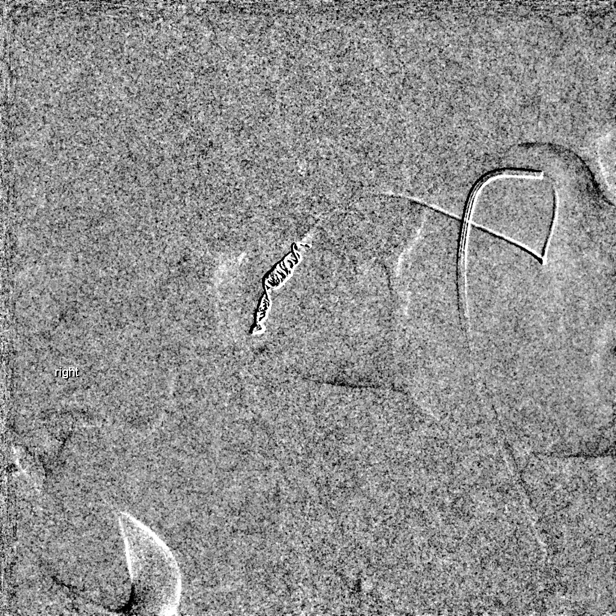

[Series 24: body 4 care · 1 of 13 slices shown (11 of 13)]
[im 13/13]
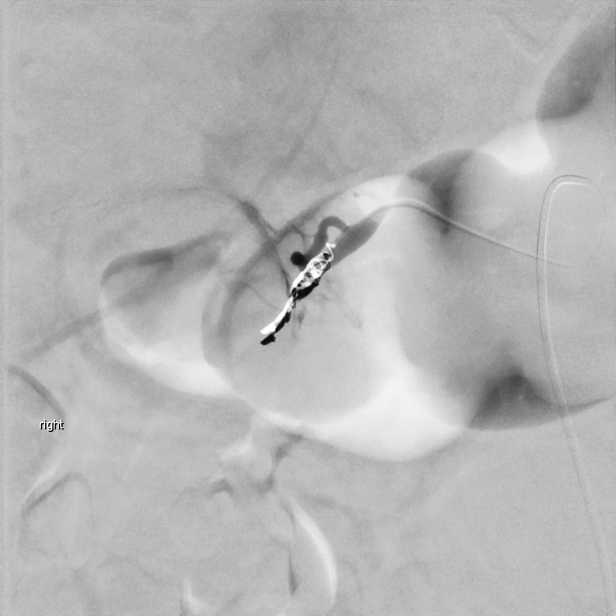

[Series 26: body 4 care · 1 of 5 slices shown (12 of 13)]
[im 1/5]
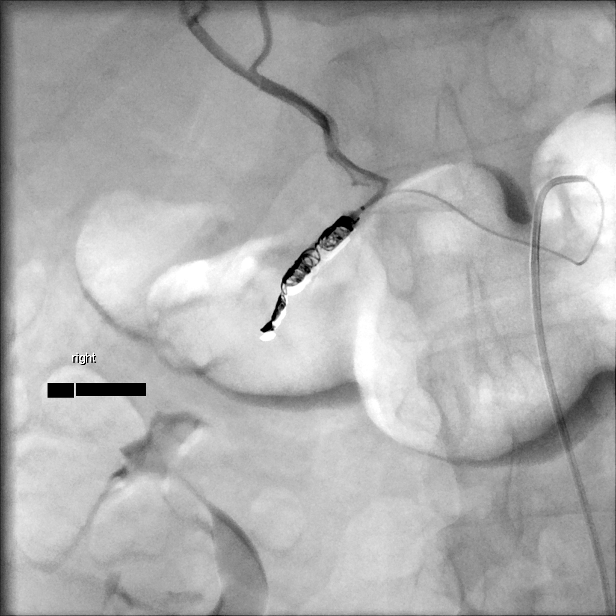

[Series 28: body 4 care · 1 of 5 slices shown (13 of 13)]
[im 1/5]
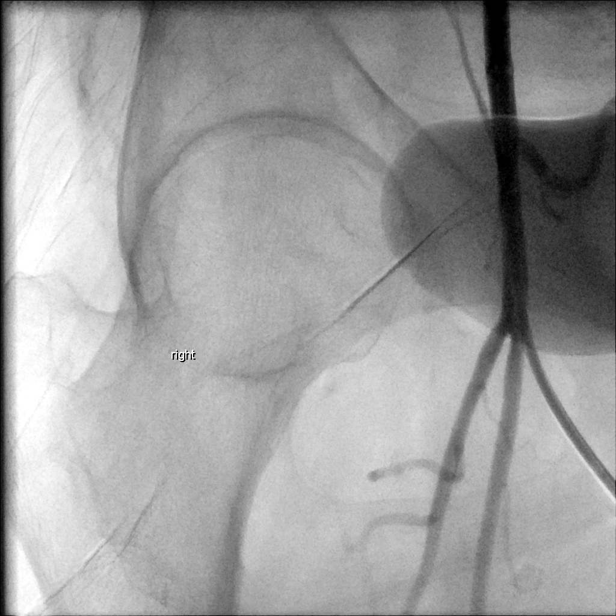

[13 of 24 positions shown; findings below may reference images not displayed]

EXAM:
SELECTIVE MESENTERIC ARTERIOGRAM

FLUSH ABDOMINAL AORTOGRAPHY

COIL EMBOLIZATION OF GASTRODUODENAL ARTERY

ULTRASOUND FOR VASCULAR ACCESS

MEDICATIONS:
no antibiotics indicated

ANESTHESIA/SEDATION:
General anesthetic by [REDACTED]

PROCEDURE:
Informed consent was obtained from the patient following explanation
of the procedure, risks, benefits and alternatives. The patient
understands, agrees and consents for the procedure. All questions
were addressed. General anaesthesia was induced. A time out was
performed prior to the initiation of the procedure. Maximal barrier
sterile technique utilized including caps, mask, sterile gowns,
sterile gloves, large sterile drape, hand hygiene, and Betadine
prep.

Patency of the right common femoral artery confirmed with ultrasound
documentation. Under real-time ultrasound guidance, the vessel was
accessed with a 21-gauge micropuncture needle, exchanged over a 018
guidewire for a transitional dilator, through which a 035 guidewire
was advanced. 5 French vascular sheath placed. 5 French C2 catheter
advanced in the SMA selectively catheterized for selective
arteriography. Subsequently the celiac axis was selectively
catheterized for arteriography. Additional views of the SMA within
obtained. The C2 was exchanged for a pigtail catheter for flush
aortography. Having confirmed visceral anatomy, the C2 was then
advanced into the SMA for additional arteriography in variety of
projections, and subsequently passage of coaxial Renegade
microcatheter in attempts to negotiate collateral supply to the
celiac axis and GDA. After a variety of microcatheter and guidewire
combinations were attempted, celiac access was again catheterized
with a C2 and coaxial microcatheter advanced into the gastroduodenal
artery for sub selective arteriography. The vessel was then
embolized with 2, 3, and 4 mm overlapping interlock coils with
cessation of flow in the segment. After final confirmatory
arteriogram, microcatheter and CT were removed. After confirmatory
common femoral arteriogram, the sheath was removed and hemostasis
achieved with the Exoseal device. The patient tolerated the
procedure well.
FINDINGS: Superior mesenteric arteriogram demonstrated patency of the trunk,
replaced right hepatic arterial supply (anatomic variant), and
well-developed collateral supply to the celiac axis distribution.

Celiac arteriography demonstrated high-grade origin stenosis with
moderate atheromatous irregularity distally in the celiac axis,
classic trifurcation anatomy.

Selective gastroduodenal arteriography demonstrates branch vessels
pseudoaneurysm or contained extravasation. After coil embolization,
there is durable closure of the treated segment of the GDA,
nonvisualization of the vascular lesion, no evident complication.

FLUOROSCOPY TIME:  46 minutes 30 seconds; 0907 mGy

COMPLICATIONS:
None immediate.
IMPRESSION: 1. Visceral arterial lesion from a branch of the gastroduodenal
artery, correlating with endoscopic findings.
2. Technically successful coil embolization of gastroduodenal
artery.
3. High-grade proximal stenosis of the celiac axis with
well-developed collateral supply from the SMA distribution.
4. Replaced right hepatic arterial supply from the SMA, an anatomic
variant.

## 2022-07-16 DIAGNOSIS — Z23 Encounter for immunization: Secondary | ICD-10-CM | POA: Diagnosis not present

## 2022-07-25 DIAGNOSIS — Z6827 Body mass index (BMI) 27.0-27.9, adult: Secondary | ICD-10-CM | POA: Diagnosis not present

## 2022-07-25 DIAGNOSIS — M1991 Primary osteoarthritis, unspecified site: Secondary | ICD-10-CM | POA: Diagnosis not present

## 2022-07-25 DIAGNOSIS — M810 Age-related osteoporosis without current pathological fracture: Secondary | ICD-10-CM | POA: Diagnosis not present

## 2022-07-25 DIAGNOSIS — G894 Chronic pain syndrome: Secondary | ICD-10-CM | POA: Diagnosis not present

## 2022-07-25 DIAGNOSIS — E039 Hypothyroidism, unspecified: Secondary | ICD-10-CM | POA: Diagnosis not present

## 2022-07-25 DIAGNOSIS — F329 Major depressive disorder, single episode, unspecified: Secondary | ICD-10-CM | POA: Diagnosis not present

## 2022-07-25 DIAGNOSIS — Z0001 Encounter for general adult medical examination with abnormal findings: Secondary | ICD-10-CM | POA: Diagnosis not present

## 2022-07-25 DIAGNOSIS — D518 Other vitamin B12 deficiency anemias: Secondary | ICD-10-CM | POA: Diagnosis not present

## 2022-07-25 DIAGNOSIS — E559 Vitamin D deficiency, unspecified: Secondary | ICD-10-CM | POA: Diagnosis not present

## 2022-07-25 DIAGNOSIS — M545 Low back pain, unspecified: Secondary | ICD-10-CM | POA: Diagnosis not present

## 2022-07-25 DIAGNOSIS — M5030 Other cervical disc degeneration, unspecified cervical region: Secondary | ICD-10-CM | POA: Diagnosis not present

## 2022-09-08 DIAGNOSIS — M1991 Primary osteoarthritis, unspecified site: Secondary | ICD-10-CM | POA: Diagnosis not present

## 2022-09-08 DIAGNOSIS — F419 Anxiety disorder, unspecified: Secondary | ICD-10-CM | POA: Diagnosis not present

## 2022-09-08 DIAGNOSIS — G894 Chronic pain syndrome: Secondary | ICD-10-CM | POA: Diagnosis not present

## 2022-09-23 DIAGNOSIS — Z1211 Encounter for screening for malignant neoplasm of colon: Secondary | ICD-10-CM | POA: Diagnosis not present

## 2022-10-02 DIAGNOSIS — M5030 Other cervical disc degeneration, unspecified cervical region: Secondary | ICD-10-CM | POA: Diagnosis not present

## 2022-10-02 DIAGNOSIS — F330 Major depressive disorder, recurrent, mild: Secondary | ICD-10-CM | POA: Diagnosis not present

## 2022-10-02 DIAGNOSIS — G894 Chronic pain syndrome: Secondary | ICD-10-CM | POA: Diagnosis not present

## 2022-10-02 DIAGNOSIS — M1991 Primary osteoarthritis, unspecified site: Secondary | ICD-10-CM | POA: Diagnosis not present

## 2022-10-14 DIAGNOSIS — J984 Other disorders of lung: Secondary | ICD-10-CM | POA: Diagnosis not present

## 2022-10-14 DIAGNOSIS — R825 Elevated urine levels of drugs, medicaments and biological substances: Secondary | ICD-10-CM | POA: Diagnosis not present

## 2022-10-14 DIAGNOSIS — F17210 Nicotine dependence, cigarettes, uncomplicated: Secondary | ICD-10-CM | POA: Diagnosis not present

## 2022-10-14 DIAGNOSIS — N179 Acute kidney failure, unspecified: Secondary | ICD-10-CM | POA: Diagnosis not present

## 2022-10-14 DIAGNOSIS — R918 Other nonspecific abnormal finding of lung field: Secondary | ICD-10-CM | POA: Diagnosis not present

## 2022-10-14 DIAGNOSIS — E875 Hyperkalemia: Secondary | ICD-10-CM | POA: Diagnosis not present

## 2022-10-14 DIAGNOSIS — I959 Hypotension, unspecified: Secondary | ICD-10-CM | POA: Diagnosis not present

## 2022-10-14 DIAGNOSIS — R55 Syncope and collapse: Secondary | ICD-10-CM | POA: Diagnosis not present

## 2022-10-14 DIAGNOSIS — N189 Chronic kidney disease, unspecified: Secondary | ICD-10-CM | POA: Diagnosis not present

## 2022-10-14 DIAGNOSIS — T402X1D Poisoning by other opioids, accidental (unintentional), subsequent encounter: Secondary | ICD-10-CM | POA: Diagnosis not present

## 2022-10-14 DIAGNOSIS — T402X4A Poisoning by other opioids, undetermined, initial encounter: Secondary | ICD-10-CM | POA: Diagnosis not present

## 2022-10-14 DIAGNOSIS — I5032 Chronic diastolic (congestive) heart failure: Secondary | ICD-10-CM | POA: Diagnosis not present

## 2022-10-14 DIAGNOSIS — Z9981 Dependence on supplemental oxygen: Secondary | ICD-10-CM | POA: Diagnosis not present

## 2022-10-14 DIAGNOSIS — Z20822 Contact with and (suspected) exposure to covid-19: Secondary | ICD-10-CM | POA: Diagnosis not present

## 2022-10-14 DIAGNOSIS — T402X1A Poisoning by other opioids, accidental (unintentional), initial encounter: Secondary | ICD-10-CM | POA: Diagnosis not present

## 2022-10-14 DIAGNOSIS — J90 Pleural effusion, not elsewhere classified: Secondary | ICD-10-CM | POA: Diagnosis not present

## 2022-10-14 DIAGNOSIS — R7989 Other specified abnormal findings of blood chemistry: Secondary | ICD-10-CM | POA: Diagnosis not present

## 2022-10-14 DIAGNOSIS — I509 Heart failure, unspecified: Secondary | ICD-10-CM | POA: Diagnosis not present

## 2022-10-14 DIAGNOSIS — Z720 Tobacco use: Secondary | ICD-10-CM | POA: Diagnosis not present

## 2022-10-14 DIAGNOSIS — Z792 Long term (current) use of antibiotics: Secondary | ICD-10-CM | POA: Diagnosis not present

## 2022-10-14 DIAGNOSIS — Z79899 Other long term (current) drug therapy: Secondary | ICD-10-CM | POA: Diagnosis not present

## 2022-10-14 DIAGNOSIS — Z7983 Long term (current) use of bisphosphonates: Secondary | ICD-10-CM | POA: Diagnosis not present

## 2022-10-14 DIAGNOSIS — J189 Pneumonia, unspecified organism: Secondary | ICD-10-CM | POA: Diagnosis not present

## 2022-10-14 DIAGNOSIS — D72829 Elevated white blood cell count, unspecified: Secondary | ICD-10-CM | POA: Diagnosis not present

## 2022-10-14 DIAGNOSIS — R4182 Altered mental status, unspecified: Secondary | ICD-10-CM | POA: Diagnosis not present

## 2022-10-14 DIAGNOSIS — R7402 Elevation of levels of lactic acid dehydrogenase (LDH): Secondary | ICD-10-CM | POA: Diagnosis not present

## 2022-10-14 DIAGNOSIS — R0902 Hypoxemia: Secondary | ICD-10-CM | POA: Diagnosis not present

## 2022-10-28 DIAGNOSIS — F329 Major depressive disorder, single episode, unspecified: Secondary | ICD-10-CM | POA: Diagnosis not present

## 2022-10-28 DIAGNOSIS — M5030 Other cervical disc degeneration, unspecified cervical region: Secondary | ICD-10-CM | POA: Diagnosis not present

## 2022-10-28 DIAGNOSIS — I5031 Acute diastolic (congestive) heart failure: Secondary | ICD-10-CM | POA: Diagnosis not present

## 2022-10-28 DIAGNOSIS — G894 Chronic pain syndrome: Secondary | ICD-10-CM | POA: Diagnosis not present

## 2022-10-28 DIAGNOSIS — F330 Major depressive disorder, recurrent, mild: Secondary | ICD-10-CM | POA: Diagnosis not present

## 2022-10-28 DIAGNOSIS — Z6827 Body mass index (BMI) 27.0-27.9, adult: Secondary | ICD-10-CM | POA: Diagnosis not present

## 2022-10-28 DIAGNOSIS — E663 Overweight: Secondary | ICD-10-CM | POA: Diagnosis not present

## 2022-10-28 DIAGNOSIS — M810 Age-related osteoporosis without current pathological fracture: Secondary | ICD-10-CM | POA: Diagnosis not present

## 2022-10-28 DIAGNOSIS — M1991 Primary osteoarthritis, unspecified site: Secondary | ICD-10-CM | POA: Diagnosis not present

## 2022-11-27 ENCOUNTER — Other Ambulatory Visit (INDEPENDENT_AMBULATORY_CARE_PROVIDER_SITE_OTHER): Payer: Self-pay | Admitting: Gastroenterology

## 2022-11-28 ENCOUNTER — Other Ambulatory Visit (INDEPENDENT_AMBULATORY_CARE_PROVIDER_SITE_OTHER): Payer: Self-pay | Admitting: Gastroenterology

## 2022-11-28 ENCOUNTER — Telehealth (INDEPENDENT_AMBULATORY_CARE_PROVIDER_SITE_OTHER): Payer: Self-pay | Admitting: *Deleted

## 2022-11-28 DIAGNOSIS — K269 Duodenal ulcer, unspecified as acute or chronic, without hemorrhage or perforation: Secondary | ICD-10-CM

## 2022-11-28 MED ORDER — PANTOPRAZOLE SODIUM 40 MG PO TBEC: 40.0000 mg | DELAYED_RELEASE_TABLET | Freq: Every day | ORAL | 0 refills | Status: DC

## 2022-11-28 NOTE — Telephone Encounter (Signed)
Discussed with patient she needed ov to continue to get refills. She will take her last one today. I offered to send in 30 day supply and let her make an appt but she declined to make an appt because she said she wasn't told she needed to come back and told that she would continue to get refills. Tried to explain to her that she needed office at least once a year if getting prescription med but she still declined to make an appt.

## 2022-11-28 NOTE — Telephone Encounter (Signed)
[facility] [facility] called to get refill on protonix. Reports it was sent to [place] yesterday and they have not heard back and patient is calling them. Pt last seen in office 2022. She did have EGD jan 2023. I tried to call patient to discuss she needs office visit since its been over one year since seen. Left her a message to return call. [place] [facility] aware she needs appt before further refills.

## 2022-11-28 NOTE — Telephone Encounter (Signed)
Will refill PPI daily for 3 months. I agree, per our policy she needs follow up appointment with any provider in order to receive any refills. If she does not follow up, no further refills will be provided  Thanks,  Maylon Peppers, MD [facility]

## 2022-12-01 DIAGNOSIS — G894 Chronic pain syndrome: Secondary | ICD-10-CM | POA: Diagnosis not present

## 2022-12-01 DIAGNOSIS — M810 Age-related osteoporosis without current pathological fracture: Secondary | ICD-10-CM | POA: Diagnosis not present

## 2022-12-01 DIAGNOSIS — M5030 Other cervical disc degeneration, unspecified cervical region: Secondary | ICD-10-CM | POA: Diagnosis not present

## 2022-12-01 DIAGNOSIS — F330 Major depressive disorder, recurrent, mild: Secondary | ICD-10-CM | POA: Diagnosis not present

## 2022-12-01 NOTE — Telephone Encounter (Signed)
Left message to return call 

## 2022-12-03 NOTE — Telephone Encounter (Signed)
Left message to return call 

## 2023-01-06 ENCOUNTER — Telehealth (INDEPENDENT_AMBULATORY_CARE_PROVIDER_SITE_OTHER): Payer: Self-pay

## 2023-01-06 NOTE — Telephone Encounter (Signed)
Patient called office today stating she needed refills on her ppi,but she can not come into the office as her car is broke down. I advised and then transferred the patient to the front to speak to Mitzie, I advised she could have a My Chart visit or phone visit with any provider, but she would need to call her insurance and speak with them to see if they cover tele visits or My Char visits. Patient made aware she will need appointment here at [facility] clinic for further refills on her ppi (pantoprazole 40 mg daily). See 11/28/2022 telephone note  dated 11/28/2022. Per Toniann Fail patient never returned call at that time,but there was a three month prescription sent to [facility].    Will refill PPI daily for 3 months. I agree, per our policy she needs follow up appointment with any provider in order to receive any refills. If she does not follow up, no further refills will be provided   Thanks,   Katrinka Blazing, MD Gastroenterology and Hepatology [facility]         to Dolores Frame, MD     11/28/22  9:33 AM Note

## 2023-01-12 ENCOUNTER — Telehealth (INDEPENDENT_AMBULATORY_CARE_PROVIDER_SITE_OTHER): Payer: Self-pay | Admitting: *Deleted

## 2023-01-12 NOTE — Telephone Encounter (Signed)
Patient was previously taking protonix bid and last refill on 11/28/22 got sent in for one daily #90 but she was still taking one bid. She has 4 pills left and does not know if she needs to take one qd or one bid. She does have appt this Thursday at 10:45. She has transportation issues and wanted to change visit to virtual. Last seen 08/06/21 and endoscopy on 10/11/21.

## 2023-01-13 DIAGNOSIS — M1991 Primary osteoarthritis, unspecified site: Secondary | ICD-10-CM | POA: Diagnosis not present

## 2023-01-13 DIAGNOSIS — M5030 Other cervical disc degeneration, unspecified cervical region: Secondary | ICD-10-CM | POA: Diagnosis not present

## 2023-01-13 DIAGNOSIS — G894 Chronic pain syndrome: Secondary | ICD-10-CM | POA: Diagnosis not present

## 2023-01-13 DIAGNOSIS — M810 Age-related osteoporosis without current pathological fracture: Secondary | ICD-10-CM | POA: Diagnosis not present

## 2023-01-13 NOTE — Telephone Encounter (Signed)
Pharmacy aware she is suppose to take med once daily. Tried to call pt to let her know and no answer. Unable to leave a message to return call.

## 2023-01-13 NOTE — Telephone Encounter (Signed)
[place] [facility] calling to get clarification for protonix. Rx sent in on 3/8 was once a day and pt states she is taking one bid. She has virtual appt with you this Thursday. Has not been seen since 2022.

## 2023-01-13 NOTE — Telephone Encounter (Signed)
Tried to call again and no answer.  

## 2023-01-13 NOTE — Telephone Encounter (Signed)
changed

## 2023-01-14 NOTE — Telephone Encounter (Signed)
Pt has appt tomorrow 4/25. Will discuss at appt since unable to get in touch with pt.

## 2023-01-15 ENCOUNTER — Ambulatory Visit (INDEPENDENT_AMBULATORY_CARE_PROVIDER_SITE_OTHER): Payer: Medicare HMO | Admitting: Gastroenterology

## 2023-01-15 DIAGNOSIS — K269 Duodenal ulcer, unspecified as acute or chronic, without hemorrhage or perforation: Secondary | ICD-10-CM | POA: Diagnosis not present

## 2023-01-15 MED ORDER — PANTOPRAZOLE SODIUM 40 MG PO TBEC: 40.0000 mg | DELAYED_RELEASE_TABLET | Freq: Every day | ORAL | 0 refills | Status: DC

## 2023-01-15 NOTE — Patient Instructions (Signed)
Please take your protonix  ONCE A DAY I have sent the prescription for 2 months to [facility] so that you can obtain this with a good rx card as due to the confusion and you taking extra of your pills prior, your insurance will not pay or another refill this soon. Once you are getting close to the end of this prescription, please let me know and we can transition your medication back to [facility] if you prefer  Please continue to avoid ALL NSAID medications We will plan to repeat your EGD again in 2025  Follow up 1 year  It was a pleasure to see you today. I want to create trusting relationships with patients and provide genuine, compassionate, and quality care. I truly value your feedback! please be on the lookout for a survey regarding your visit with me today. I appreciate your input about our visit and your time in completing this!    [place] Mccuen L. Jeanmarie Hubert, MSN, APRN, AGNP-C Adult-Gerontology Nurse Practitioner [facility] Gastroenterology at [facility]

## 2023-01-15 NOTE — Progress Notes (Addendum)
Primary Care Physician:  Elfredia Nevins, MD  Primary GI: Levon Hedger  Patient Location: Home Provider Location: [facility] Reason for Visit: follow up of duodenal ulcer    Persons present on the virtual encounter, with roles: Nicole Laux L. Jeanmarie Hubert, MSN, APRN, AGNP-C, Elizabeth Sauer, patient    Total time (minutes) spent on medical discussion: 10 minutes  Virtual Visit via telephone  visit is conducted virtually and was requested by patient.   I connected with Nicole Welch on 01/15/23 at 10:45 AM EDT by telephone and verified that I am speaking with the correct person using two identifiers.   I discussed the limitations, risks, security and privacy concerns of performing an evaluation and management service by telephone and the availability of in person appointments. I also discussed with the patient that there may be a patient responsible charge related to this service. The patient expressed understanding and agreed to proceed.  Chief Complaint  Patient presents with   Gastroesophageal Reflux    Phone visit today to refill protonix. Pt had been taking one bid instead of one daily. She reports she did not realize it was changed.    History of Present Illness: Nicole Welch is a 67 y.o. 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   Patient presenting for follow up of duodenal ulcer.  Last seen 07/2021, at that time doing well, monitoring stool closely for melena. She denied rectal bleeding or black stools, taking protonix  BID and avoiding NSAIDs  Recommended to continue PPI BID, schedule surveillance EGD in 1-2 months, as outlined below.  Today, patient states her stomach is doing well, she advised to decrease her PPI to once daily dosing after EGD which she recently made [place] aware she had not done as she was not aware she was supposed  to decrease the dose. She has been taking protonix  BID despite once daily instructions on prescription and her pharmacy will not refill the prescription for another 45 days. She has very rare episodes of GERD, stays away from spicy, greasy foods. Denies any rectal bleeding or melena. No issues with appetite, early satiety or weight loss. She reports appetite is very good. She is not taking any NSAIDs.    EGD: 09/2021  - Normal esophagus.                           - A single gastric polyp. Resected and retrieved.                           - Double pylorus. Biopsied. (Healing ulcer, no H pylori, presence of gastric xanthoma, repeat EGD 2 years)  Last Colonoscopy:unsure, cologuard in 2021 that was negative   Past Medical History:  Diagnosis Date   Anxiety    Back pain    Chronic neck pain    DDD (degenerative disc disease), cervical    Depression    DJD (degenerative joint disease)    Gait abnormality    Heart failure (HCC) 2017   Osteoporosis      Past Surgical History:  Procedure Laterality Date   ABDOMINAL HYSTERECTOMY     BIOPSY  10/11/2021   Procedure: BIOPSY;  Surgeon: Dolores Frame, MD;  [facility];  Service: Gastroenterology;;   CESAREAN SECTION  ESOPHAGOGASTRODUODENOSCOPY (EGD) WITH PROPOFOL N/A 04/29/2020   Procedure: ESOPHAGOGASTRODUODENOSCOPY (EGD) WITH PROPOFOL;  Surgeon: Malissa Hippo, MD;  [facility];  Service: Endoscopy;  Laterality: N/A;   ESOPHAGOGASTRODUODENOSCOPY (EGD) WITH PROPOFOL N/A 05/01/2020   Procedure: ESOPHAGOGASTRODUODENOSCOPY (EGD) WITH PROPOFOL;  Surgeon: Dolores Frame, MD;  [facility];  Service: Gastroenterology;  Laterality: N/A;   ESOPHAGOGASTRODUODENOSCOPY (EGD) WITH PROPOFOL N/A 07/31/2020   Castaneda: normal esophagus, double pylorus without stigmata of recent bleeding, duodenal erosion w/o bleeding, no specimens   ESOPHAGOGASTRODUODENOSCOPY (EGD) WITH PROPOFOL N/A 10/11/2021    Procedure: ESOPHAGOGASTRODUODENOSCOPY (EGD) WITH PROPOFOL;  Surgeon: Dolores Frame, MD;  [facility];  Service: Gastroenterology;  Laterality: N/A;  815   HERNIA REPAIR Right    IR ANGIOGRAM SELECTIVE EACH ADDITIONAL VESSEL  05/01/2020   IR ANGIOGRAM SELECTIVE EACH ADDITIONAL VESSEL  05/01/2020   IR ANGIOGRAM VISCERAL SELECTIVE  05/01/2020   IR ANGIOGRAM VISCERAL SELECTIVE  05/01/2020   IR US GUIDE VASC ACCESS RIGHT  05/01/2020   POLYPECTOMY  10/11/2021   Procedure: POLYPECTOMY;  Surgeon: Dolores Frame, MD;  [facility];  Service: Gastroenterology;;  gastric nodule used snare   RADIOLOGY WITH ANESTHESIA N/A 05/01/2020   Procedure: IR WITH ANESTHESIA;  Surgeon: Radiologist, Medication, MD;  [facility]: [facility];  Service: Radiology;  Laterality: N/A;   TUBAL LIGATION       Current Meds  Medication Sig   alendronate (FOSAMAX) 70 MG tablet Take 70 mg by mouth every Tuesday. Take with a full glass of water on an empty stomach. Tuesday   ALPRAZolam (XANAX) 0.5 MG tablet Take 0.5 mg by mouth at bedtime. Additional 0.5 mg if needed during the day   Ascorbic Acid (VITAMIN C) 1000 MG tablet Take 1,000 mg by mouth daily.   aspirin EC 81 MG EC tablet Take 1 tablet (81 mg total) by mouth daily.   cholecalciferol (VITAMIN D) 25 MCG (1000 UNIT) tablet Take 1,000 mg by mouth daily.   ezetimibe (ZETIA) 10 MG tablet Take 10 mg by mouth at bedtime.   furosemide (LASIX) 40 MG tablet Take 60 mg by mouth 2 (two) times daily.    levothyroxine (SYNTHROID) 25 MCG tablet Take 25 mcg by mouth daily before breakfast.   Menthol, Topical Analgesic, (BIOFREEZE) 4 % GEL Apply 1 application topically daily as needed (pain).   oxyCODONE-acetaminophen (PERCOCET) 5-325 MG per tablet Take 1 tablet by mouth every 6 (six) hours as needed for moderate pain (Back pain).    pantoprazole (PROTONIX) 40 MG tablet Take 1 tablet (40 mg total) by mouth daily.   polyethylene glycol (MIRALAX  / GLYCOLAX) 17 g packet Take 17 g by mouth daily.   potassium chloride (KLOR-CON) 20 MEQ packet Take 30 mEq by mouth daily.   Quercetin 500 MG CAPS Take 500 mg by mouth daily.   simvastatin (ZOCOR) 40 MG tablet Take 40 mg by mouth at bedtime.   Zinc 30 MG CAPS Take 30 mg by mouth daily.   [DISCONTINUED] Biotin 16109 MCG TABS Take 10,000 mcg by mouth daily. Hair Skin and Nails   [DISCONTINUED] docusate sodium (COLACE) 100 MG capsule Take 300 mg by mouth at bedtime.   [DISCONTINUED] Wheat Dextrin (BENEFIBER) CHEW Chew 1 each by mouth 3 (three) times a week.     Family History  Problem Relation Age of Onset   Heart disease Other        family history    Arthritis Other  family history    CAD Father        58's    Social History   Socioeconomic History   Marital status: Married    Spouse name: Not on file   Number of children: Not on file   Years of education: ged    Highest education level: Not on file  Occupational History   Occupation: Set designer work   Tobacco Use   Smoking status: Former    Packs/day: 1    Types: Cigarettes   Smokeless tobacco: Never  Vaping Use   Vaping Use: Never used  Substance and Sexual Activity   Alcohol use: Yes    Comment: occa   Drug use: No   Sexual activity: Not on file  Other Topics Concern   Not on file  Social History Narrative   Not on file   Social Determinants of Health   Financial Resource Strain: Not on file  Food Insecurity: Not on file  Transportation Needs: Not on file  Physical Activity: Not on file  Stress: Not on file  Social Connections: Not on file    Review of Systems: Gen: Denies fever, chills, anorexia. Denies fatigue, weakness, weight loss.  CV: Denies chest pain, palpitations, syncope, peripheral edema, and claudication. Resp: Denies dyspnea at rest, cough, wheezing, coughing up blood, and pleurisy. GI: see HPI Derm: Denies rash, itching, dry skin Psych: Denies depression, anxiety, memory loss,  confusion. No homicidal or suicidal ideation.  Heme: Denies bruising, bleeding, and enlarged lymph nodes.  Observations/Objective: No distress. Unable to perform physical exam due to telephone encounter. No video available.   Assessment and Plan: Nicole Welch is a 67 y.o. 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 She is doing well. Last EGD in early 2023 with healing ulcer. Advised to repeat in 2 years. There was some confusion and patient continued PPI BID despite once daily prescription, therefore, her insurance will not allow her to refill this for about 45 more days. As she is doing well on protonix, will send 2 months supply to [facility] so she can use a goodrx card to obtain her PPI. She needs to take this ONCE A DAY. Once she is nearing the end of her Rx she can let us know and we can transition it back to [employer].  -continue with protonix 40mg  once daily (#60 to [facility] with goodrx card then will transition Rx back to [facility]) - repeat EGD 2025  -continue to avoid all NSAIDs   Follow Up Instructions: 1 year    I discussed the assessment and treatment plan with the patient. The patient was provided an opportunity to ask questions and all were answered. The patient agreed with the plan and demonstrated an understanding of the instructions.   The patient was advised to call back or seek an in-person evaluation if the symptoms worsen or if the condition fails to improve as anticipated.  I provided 10 minutes of NON face-to-face time during this telephone encounter  Nicole Welch L. Tramar Brueckner, MSN, APRN, AGNP-C Adult-Gerontology Nurse Practitioner [facility] for [facility] Diseases  I have reviewed the note and agree with the APP's assessment as described in this progress note  Patient will need surveillance  EGD in 2025 due to high risk of biliary reflux to her stomach in the setting of double pylorus.  We  can discuss in her upcoming appointment if she would like to have a colonoscopy at that time or have repeat Cologuard as last was performed in 2021.  Katrinka Blazing, MD Gastroenterology and Hepatology [facility] Gastroenterology

## 2023-02-19 DIAGNOSIS — L209 Atopic dermatitis, unspecified: Secondary | ICD-10-CM | POA: Diagnosis not present

## 2023-02-19 DIAGNOSIS — G894 Chronic pain syndrome: Secondary | ICD-10-CM | POA: Diagnosis not present

## 2023-02-19 DIAGNOSIS — F458 Other somatoform disorders: Secondary | ICD-10-CM | POA: Diagnosis not present

## 2023-02-19 DIAGNOSIS — M810 Age-related osteoporosis without current pathological fracture: Secondary | ICD-10-CM | POA: Diagnosis not present

## 2023-03-10 ENCOUNTER — Other Ambulatory Visit (INDEPENDENT_AMBULATORY_CARE_PROVIDER_SITE_OTHER): Payer: Self-pay | Admitting: *Deleted

## 2023-03-10 MED ORDER — PANTOPRAZOLE SODIUM 40 MG PO TBEC: 40.0000 mg | DELAYED_RELEASE_TABLET | Freq: Every day | ORAL | 3 refills | Status: DC

## 2023-04-27 DIAGNOSIS — J209 Acute bronchitis, unspecified: Secondary | ICD-10-CM | POA: Diagnosis not present

## 2023-04-27 DIAGNOSIS — G894 Chronic pain syndrome: Secondary | ICD-10-CM | POA: Diagnosis not present

## 2023-04-27 DIAGNOSIS — U071 COVID-19: Secondary | ICD-10-CM | POA: Diagnosis not present

## 2023-04-27 DIAGNOSIS — M810 Age-related osteoporosis without current pathological fracture: Secondary | ICD-10-CM | POA: Diagnosis not present

## 2023-06-15 DIAGNOSIS — G894 Chronic pain syndrome: Secondary | ICD-10-CM | POA: Diagnosis not present

## 2023-06-15 DIAGNOSIS — Z23 Encounter for immunization: Secondary | ICD-10-CM | POA: Diagnosis not present

## 2023-06-15 DIAGNOSIS — Z6828 Body mass index (BMI) 28.0-28.9, adult: Secondary | ICD-10-CM | POA: Diagnosis not present

## 2023-06-15 DIAGNOSIS — M545 Low back pain, unspecified: Secondary | ICD-10-CM | POA: Diagnosis not present

## 2023-06-15 DIAGNOSIS — E663 Overweight: Secondary | ICD-10-CM | POA: Diagnosis not present

## 2023-06-15 DIAGNOSIS — M1991 Primary osteoarthritis, unspecified site: Secondary | ICD-10-CM | POA: Diagnosis not present

## 2023-07-06 DIAGNOSIS — J329 Chronic sinusitis, unspecified: Secondary | ICD-10-CM | POA: Diagnosis not present

## 2023-07-06 DIAGNOSIS — J209 Acute bronchitis, unspecified: Secondary | ICD-10-CM | POA: Diagnosis not present

## 2023-07-06 DIAGNOSIS — M5030 Other cervical disc degeneration, unspecified cervical region: Secondary | ICD-10-CM | POA: Diagnosis not present

## 2023-07-06 DIAGNOSIS — G894 Chronic pain syndrome: Secondary | ICD-10-CM | POA: Diagnosis not present

## 2023-08-13 DIAGNOSIS — J329 Chronic sinusitis, unspecified: Secondary | ICD-10-CM | POA: Diagnosis not present

## 2023-08-13 DIAGNOSIS — R0789 Other chest pain: Secondary | ICD-10-CM | POA: Diagnosis not present

## 2023-08-13 DIAGNOSIS — J209 Acute bronchitis, unspecified: Secondary | ICD-10-CM | POA: Diagnosis not present

## 2023-08-13 DIAGNOSIS — G894 Chronic pain syndrome: Secondary | ICD-10-CM | POA: Diagnosis not present

## 2023-09-09 ENCOUNTER — Encounter (INDEPENDENT_AMBULATORY_CARE_PROVIDER_SITE_OTHER): Payer: Self-pay | Admitting: *Deleted

## 2023-10-12 DIAGNOSIS — Z9229 Personal history of other drug therapy: Secondary | ICD-10-CM | POA: Diagnosis not present

## 2023-10-12 DIAGNOSIS — Z0001 Encounter for general adult medical examination with abnormal findings: Secondary | ICD-10-CM | POA: Diagnosis not present

## 2023-10-12 DIAGNOSIS — F330 Major depressive disorder, recurrent, mild: Secondary | ICD-10-CM | POA: Diagnosis not present

## 2023-10-12 DIAGNOSIS — E538 Deficiency of other specified B group vitamins: Secondary | ICD-10-CM | POA: Diagnosis not present

## 2023-10-12 DIAGNOSIS — E782 Mixed hyperlipidemia: Secondary | ICD-10-CM | POA: Diagnosis not present

## 2023-10-12 DIAGNOSIS — M5030 Other cervical disc degeneration, unspecified cervical region: Secondary | ICD-10-CM | POA: Diagnosis not present

## 2023-10-12 DIAGNOSIS — G894 Chronic pain syndrome: Secondary | ICD-10-CM | POA: Diagnosis not present

## 2023-10-12 DIAGNOSIS — F329 Major depressive disorder, single episode, unspecified: Secondary | ICD-10-CM | POA: Diagnosis not present

## 2023-10-12 DIAGNOSIS — R7309 Other abnormal glucose: Secondary | ICD-10-CM | POA: Diagnosis not present

## 2023-10-12 DIAGNOSIS — E663 Overweight: Secondary | ICD-10-CM | POA: Diagnosis not present

## 2023-10-12 DIAGNOSIS — M810 Age-related osteoporosis without current pathological fracture: Secondary | ICD-10-CM | POA: Diagnosis not present

## 2023-10-12 DIAGNOSIS — R011 Cardiac murmur, unspecified: Secondary | ICD-10-CM | POA: Diagnosis not present

## 2023-10-12 DIAGNOSIS — E559 Vitamin D deficiency, unspecified: Secondary | ICD-10-CM | POA: Diagnosis not present

## 2023-10-12 DIAGNOSIS — M1991 Primary osteoarthritis, unspecified site: Secondary | ICD-10-CM | POA: Diagnosis not present

## 2023-11-27 DIAGNOSIS — M810 Age-related osteoporosis without current pathological fracture: Secondary | ICD-10-CM | POA: Diagnosis not present

## 2023-11-27 DIAGNOSIS — M5030 Other cervical disc degeneration, unspecified cervical region: Secondary | ICD-10-CM | POA: Diagnosis not present

## 2023-11-27 DIAGNOSIS — G894 Chronic pain syndrome: Secondary | ICD-10-CM | POA: Diagnosis not present

## 2023-12-21 DIAGNOSIS — E782 Mixed hyperlipidemia: Secondary | ICD-10-CM | POA: Diagnosis not present

## 2023-12-21 DIAGNOSIS — F1120 Opioid dependence, uncomplicated: Secondary | ICD-10-CM | POA: Diagnosis not present

## 2023-12-21 DIAGNOSIS — E063 Autoimmune thyroiditis: Secondary | ICD-10-CM | POA: Diagnosis not present

## 2023-12-21 DIAGNOSIS — F419 Anxiety disorder, unspecified: Secondary | ICD-10-CM | POA: Diagnosis not present

## 2023-12-21 DIAGNOSIS — G894 Chronic pain syndrome: Secondary | ICD-10-CM | POA: Diagnosis not present

## 2024-01-18 ENCOUNTER — Ambulatory Visit (INDEPENDENT_AMBULATORY_CARE_PROVIDER_SITE_OTHER): Payer: Medicare HMO | Admitting: Gastroenterology

## 2024-01-20 DIAGNOSIS — F1120 Opioid dependence, uncomplicated: Secondary | ICD-10-CM | POA: Diagnosis not present

## 2024-01-20 DIAGNOSIS — M5030 Other cervical disc degeneration, unspecified cervical region: Secondary | ICD-10-CM | POA: Diagnosis not present

## 2024-01-20 DIAGNOSIS — M810 Age-related osteoporosis without current pathological fracture: Secondary | ICD-10-CM | POA: Diagnosis not present

## 2024-01-20 DIAGNOSIS — E063 Autoimmune thyroiditis: Secondary | ICD-10-CM | POA: Diagnosis not present

## 2024-01-20 DIAGNOSIS — G894 Chronic pain syndrome: Secondary | ICD-10-CM | POA: Diagnosis not present

## 2024-01-20 DIAGNOSIS — E782 Mixed hyperlipidemia: Secondary | ICD-10-CM | POA: Diagnosis not present

## 2024-02-02 ENCOUNTER — Ambulatory Visit: Payer: Medicare HMO | Admitting: Internal Medicine

## 2024-03-03 ENCOUNTER — Ambulatory Visit (INDEPENDENT_AMBULATORY_CARE_PROVIDER_SITE_OTHER): Admitting: Gastroenterology

## 2024-03-03 ENCOUNTER — Encounter (INDEPENDENT_AMBULATORY_CARE_PROVIDER_SITE_OTHER): Payer: Self-pay | Admitting: Gastroenterology

## 2024-03-03 ENCOUNTER — Telehealth (INDEPENDENT_AMBULATORY_CARE_PROVIDER_SITE_OTHER): Payer: Self-pay | Admitting: Gastroenterology

## 2024-03-03 VITALS — BP 138/77 | HR 111 | Temp 97.8°F | Ht 62.0 in | Wt 160.2 lb

## 2024-03-03 DIAGNOSIS — K219 Gastro-esophageal reflux disease without esophagitis: Secondary | ICD-10-CM | POA: Diagnosis not present

## 2024-03-03 DIAGNOSIS — K269 Duodenal ulcer, unspecified as acute or chronic, without hemorrhage or perforation: Secondary | ICD-10-CM

## 2024-03-03 MED ORDER — PANTOPRAZOLE SODIUM 40 MG PO TBEC: 40.0000 mg | DELAYED_RELEASE_TABLET | Freq: Every day | ORAL | 3 refills | Status: AC

## 2024-03-03 NOTE — Telephone Encounter (Signed)
-----   Message from Patterson Bora sent at 03/03/2024  3:21 PM EDT ----- I requested recent cologuard, I'm unable to read this. Can we see if they can fax over a better copy? Thanks! ----- Message ----- From: Vallie Gay Sent: 03/03/2024   3:13 PM EDT To: Patterson Bora, NP

## 2024-03-03 NOTE — Progress Notes (Addendum)
 Referring Provider: Kathyleen Parkins, MD Primary Care Physician:  Kathyleen Parkins, MD Primary GI Physician: Dr. Sammi Crick   Chief Complaint  Patient presents with   Follow-up    Patient here today for a follow up. Patient denies any current gi issues. Will need refills of Pantoprazole  40 mg daily.   HPI:   Nicole Welch is a 68 y.o. 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    Patient presenting today for:  Follow up of duodenal ulcer disease GERD Need for CRC screening   Last seen April 2024, virtually at that time, doing well. On protonix  40mg  daily, Having rare breakthrough   Advised to continue protonix  40mg  daily, repeat EGD in 2025, continue to avoid NSAIDs, discuss colonoscopy vs. Cologuard as well  Present:  Doing well on protonix  40mg  daily, no issues with reflux. Denies abdominal pain. Having a Bm daily. No rectal bleeding, melena, appetite is good. No weight loss. Denies NSAIDs.  Reports she had cologuard within the last year or so with PCP. I cannot review these records.   States she prefers to do virtual visits if possible  EGD: 09/2021  -Normal esophagus.                           -A single gastric polyp. Resected and retrieved.                           -Double pylorus. Biopsied. (Healing ulcer, no H pylori, presence of gastric xanthoma, repeat EGD 2 years)   Last Colonoscopy:unsure, cologuard in 2021 that was negative   Filed Weights   03/03/24 1115  Weight: 160 lb 3.2 oz (72.7 kg)     Past Medical History:  Diagnosis Date   Anxiety    Back pain    Chronic neck pain    DDD (degenerative disc disease), cervical    Depression    DJD (degenerative joint disease)    Gait abnormality    Heart failure (HCC) 2017   Osteoporosis     Past Surgical History:  Procedure Laterality Date   ABDOMINAL HYSTERECTOMY     BIOPSY   10/11/2021   Procedure: BIOPSY;  Surgeon: Urban Garden, MD;  [facility];  Service: Gastroenterology;;   CESAREAN SECTION     ESOPHAGOGASTRODUODENOSCOPY (EGD) WITH PROPOFOL  N/A 04/29/2020   Procedure: ESOPHAGOGASTRODUODENOSCOPY (EGD) WITH PROPOFOL ;  Surgeon: Ruby Corporal, MD;  [facility];  Service: Endoscopy;  Laterality: N/A;   ESOPHAGOGASTRODUODENOSCOPY (EGD) WITH PROPOFOL  N/A 05/01/2020   Procedure: ESOPHAGOGASTRODUODENOSCOPY (EGD) WITH PROPOFOL ;  Surgeon: Urban Garden, MD;  [facility];  Service: Gastroenterology;  Laterality: N/A;   ESOPHAGOGASTRODUODENOSCOPY (EGD) WITH PROPOFOL  N/A 07/31/2020   Castaneda: normal esophagus, double pylorus without stigmata of recent bleeding, duodenal erosion w/o bleeding, no specimens   ESOPHAGOGASTRODUODENOSCOPY (EGD) WITH PROPOFOL  N/A 10/11/2021   Procedure: ESOPHAGOGASTRODUODENOSCOPY (EGD) WITH PROPOFOL ;  Surgeon: Urban Garden, MD;  [facility];  Service: Gastroenterology;  Laterality: N/A;  815   HERNIA REPAIR Right    IR ANGIOGRAM SELECTIVE EACH ADDITIONAL VESSEL  05/01/2020   IR ANGIOGRAM SELECTIVE EACH ADDITIONAL VESSEL  05/01/2020   IR ANGIOGRAM VISCERAL SELECTIVE  05/01/2020   IR ANGIOGRAM VISCERAL SELECTIVE  05/01/2020   IR US  GUIDE VASC ACCESS RIGHT  05/01/2020  POLYPECTOMY  10/11/2021   Procedure: POLYPECTOMY;  Surgeon: Urban Garden, MD;  [facility];  Service: Gastroenterology;;  gastric nodule used snare   RADIOLOGY WITH ANESTHESIA N/A 05/01/2020   Procedure: IR WITH ANESTHESIA;  Surgeon: Radiologist, Medication, MD;  [facility]: [facility];  Service: Radiology;  Laterality: N/A;   TUBAL LIGATION      Current Outpatient Medications  Medication Sig Dispense Refill   alendronate (FOSAMAX) 70 MG tablet Take 70 mg by mouth every Tuesday. Take with a full glass of water  on an empty stomach. Tuesday     ALPRAZolam  (XANAX ) 0.5 MG tablet  Take 0.5 mg by mouth at bedtime. Additional 0.5 mg if needed during the day     Ascorbic Acid (VITAMIN C) 1000 MG tablet Take 1,000 mg by mouth daily.     aspirin  EC 81 MG EC tablet Take 1 tablet (81 mg total) by mouth daily. 30 tablet 0   cholecalciferol (VITAMIN D ) 25 MCG (1000 UNIT) tablet Take 1,000 mg by mouth daily.     ezetimibe  (ZETIA ) 10 MG tablet Take 10 mg by mouth at bedtime.     furosemide  (LASIX ) 40 MG tablet Take 60 mg by mouth 2 (two) times daily.      levothyroxine (SYNTHROID) 25 MCG tablet Take 25 mcg by mouth daily before breakfast.     Menthol, Topical Analgesic, (BIOFREEZE) 4 % GEL Apply 1 application topically daily as needed (pain).     oxyCODONE -acetaminophen  (PERCOCET) 5-325 MG per tablet Take 1 tablet by mouth every 6 (six) hours as needed for moderate pain (Back pain).      pantoprazole  (PROTONIX ) 40 MG tablet Take 1 tablet (40 mg total) by mouth daily. 90 tablet 3   polyethylene glycol (MIRALAX  / GLYCOLAX ) 17 g packet Take 17 g by mouth daily.     potassium chloride  (KLOR-CON ) 20 MEQ packet Take 30 mEq by mouth daily. 20 tablet 0   Quercetin 500 MG CAPS Take 500 mg by mouth daily.     simvastatin  (ZOCOR ) 40 MG tablet Take 40 mg by mouth at bedtime.     Zinc 30 MG CAPS Take 30 mg by mouth daily.     No current facility-administered medications for this visit.    Allergies as of 03/03/2024 - Review Complete 03/03/2024  Allergen Reaction Noted   Other Other (See Comments) 07/27/2020    Social History   Socioeconomic History   Marital status: Married    Spouse name: Not on file   Number of children: Not on file   Years of education: ged    Highest education level: Not on file  Occupational History   Occupation: Set designer work   Tobacco Use   Smoking status: Former    Current packs/day: 1.00    Types: Cigarettes   Smokeless tobacco: Never  Vaping Use   Vaping status: Never Used  Substance and Sexual Activity   Alcohol use: Yes    Comment: occa    Drug use: No   Sexual activity: Not on file  Other Topics Concern   Not on file  Social History Narrative   Not on file   Social Drivers of Health   Financial Resource Strain: Low Risk  (10/15/2022)   Received from [facility]   Overall Financial Resource Strain (CARDIA)    Difficulty of Paying Living Expenses: Not hard at all  Food Insecurity: No Food Insecurity (10/15/2022)   Received from [facility]   Hunger Vital Sign  Worried About Programme researcher, broadcasting/film/video in the Last Year: Never true    Ran Out of Food in the Last Year: Never true  Transportation Needs: No Transportation Needs (10/15/2022)   Received from [facility] - Transportation    Lack of Transportation (Medical): No    Lack of Transportation (Non-Medical): No  Physical Activity: Insufficiently Active (10/15/2022)   Received from [facility]   Exercise Vital Sign    Days of Exercise per Week: 5 days    Minutes of Exercise per Session: 10 min  Stress: No Stress Concern Present (10/15/2022)   Received from [facility] of Occupational [facility] - Occupational Stress Questionnaire    Feeling of Stress : Not at all  Social Connections: Moderately Isolated (10/15/2022)   Received from [facility]   Social Connection and Isolation Panel    Frequency of Communication with Friends and Family: Three times a week    Frequency of Social Gatherings with Friends and Family: Three times a week    Attends Religious Services: Never    Active Member of Clubs or Organizations: No    Attends Engineer, structural: Never    Marital Status: Married    Review of systems General: negative for malaise, night sweats, fever, chills, weight loss Neck: Negative for lumps, goiter, pain and significant neck swelling Resp: Negative for cough, wheezing, dyspnea at rest CV: Negative for chest pain, leg swelling, palpitations, orthopnea GI: denies melena, hematochezia, nausea, vomiting,  diarrhea, constipation, dysphagia, odyonophagia, early satiety or unintentional weight loss.  The remainder of the review of systems is noncontributory.  Physical Exam: BP 138/77 (BP Location: Left Arm, Patient Position: Sitting, Cuff Size: Normal)   Pulse (!) 111   Temp 97.8 F (36.6 C) (Temporal)   Ht 5' 2 (1.575 m)   Wt 160 lb 3.2 oz (72.7 kg)   BMI 29.30 kg/m  General:   Alert and oriented. No distress noted. Pleasant and cooperative.  Head:  Normocephalic and atraumatic. Eyes:  Conjuctiva clear without scleral icterus. Mouth:  Oral mucosa pink and moist. Good dentition. No lesions. Heart: Normal rate and rhythm, s1 and s2 heart sounds present.  Lungs: Clear lung sounds in all lobes. Respirations equal and unlabored. Abdomen:  +BS, soft, non-tender and non-distended. No rebound or guarding. No HSM or masses noted. Neurologic:  Alert and  oriented x4 Psych:  Alert and cooperative. Normal mood and affect.  Invalid input(s): 6 MONTHS   ASSESSMENT: Nicole Welch is a 68 y.o. female presenting today for follow up of GERD, duodenal ulcer disease and need for CRC screening   GERD/duodenal ulcer disease: no abdominal pain, nausea, vomiting. No melena. GERD well managed with protonix  40mg  daily. Will continue with current regimen.  Last EGD in January 2023, recommended repeat EGD in 2 years due to double pylorus. I discussed recommendations and indications for repeat EGD with the patient, at this time she is uncertain if she wishes to undergo an EGD right now as she is feeling well. She will think about this and let me know  Need for CRC screening: last cologuard on file was in 2021, negative. She reports she thinks she has had a more recent EGD, I cannot review these records, will reach out to PCP to obtain most recent Cologuard. If no recent one done, we will need to repeat this. She does not wish to undergo any screening colonoscopy at this time.  PLAN:  -recommended  repeat EGD due to double pylorus, patient to make me aware if she wishes to proceed  -obtain cologuard results from PCP or perform updated Cologuard if not done  -continue protonix  40mg  daily  -good reflux precautions   All questions were answered, patient verbalized understanding and is in agreement with plan as outlined above.   Follow Up: 1 year   Jensen Kilburg L. Adrien Alberta, MSN, APRN, AGNP-C Adult-Gerontology Nurse Practitioner [facility] for GI Diseases  I have reviewed the note and agree with the APP's assessment as described in this progress note  Samantha Cress, MD Gastroenterology and Hepatology [facility] Gastroenterology

## 2024-03-03 NOTE — Patient Instructions (Signed)
 We will continue with protonix  40mg  daily Please consider repeat upper endoscopy as discussed I will try to obtain most recent cologuard records from PCP as your last on file here is from 2021  Follow up 1 year, we can do virtual if you prefer

## 2024-03-03 NOTE — Telephone Encounter (Signed)
Message sent to front desk

## 2024-03-10 ENCOUNTER — Telehealth (INDEPENDENT_AMBULATORY_CARE_PROVIDER_SITE_OTHER): Payer: Self-pay | Admitting: Gastroenterology

## 2024-03-10 NOTE — Telephone Encounter (Signed)
-----   Message from Patterson Bora sent at 03/03/2024  3:21 PM EDT ----- I requested recent cologuard, I'm unable to read this. Can we see if they can fax over a better copy? Thanks! ----- Message ----- From: Vallie Gay Sent: 03/03/2024   3:13 PM EDT To: Patterson Bora, NP

## 2024-03-10 NOTE — Telephone Encounter (Signed)
 Sent email to PCP office to see if we can get a clearer copy.

## 2024-03-23 ENCOUNTER — Encounter: Payer: Self-pay | Admitting: Internal Medicine

## 2024-03-23 ENCOUNTER — Ambulatory Visit: Attending: Internal Medicine | Admitting: Internal Medicine

## 2024-03-23 VITALS — BP 124/62 | HR 98 | Ht 63.0 in | Wt 158.4 lb

## 2024-03-23 DIAGNOSIS — R9431 Abnormal electrocardiogram [ECG] [EKG]: Secondary | ICD-10-CM | POA: Diagnosis not present

## 2024-03-23 DIAGNOSIS — M5030 Other cervical disc degeneration, unspecified cervical region: Secondary | ICD-10-CM | POA: Diagnosis not present

## 2024-03-23 DIAGNOSIS — I5031 Acute diastolic (congestive) heart failure: Secondary | ICD-10-CM

## 2024-03-23 DIAGNOSIS — F419 Anxiety disorder, unspecified: Secondary | ICD-10-CM | POA: Diagnosis not present

## 2024-03-23 DIAGNOSIS — R011 Cardiac murmur, unspecified: Secondary | ICD-10-CM | POA: Diagnosis not present

## 2024-03-23 DIAGNOSIS — Z6826 Body mass index (BMI) 26.0-26.9, adult: Secondary | ICD-10-CM | POA: Diagnosis not present

## 2024-03-23 DIAGNOSIS — M1991 Primary osteoarthritis, unspecified site: Secondary | ICD-10-CM | POA: Diagnosis not present

## 2024-03-23 DIAGNOSIS — E063 Autoimmune thyroiditis: Secondary | ICD-10-CM | POA: Diagnosis not present

## 2024-03-23 DIAGNOSIS — F1120 Opioid dependence, uncomplicated: Secondary | ICD-10-CM | POA: Diagnosis not present

## 2024-03-23 DIAGNOSIS — G894 Chronic pain syndrome: Secondary | ICD-10-CM | POA: Diagnosis not present

## 2024-03-23 NOTE — Telephone Encounter (Signed)
 Fax from PCP replying to email that was sent. Scanned in media  Per PCP office-the most recent we have is from 2023. They did include picture of [facility] and cards

## 2024-03-23 NOTE — Progress Notes (Signed)
 Cardiology Office Note   Date   03/23/24  ID:  Traniece, Boffa [birth year], MRN [id number]  PCP:  Bertell Satterfield, MD  Cardiologist:   Vina Gull, MD   Pt returns for follow up of HFpEF    History of Present Illness: Nicole Welch is a 68 y.o. female with a history of HFpEF, HL, tobacco abuse    The pt was previously seen by GORMAN Rout in cardiology, last visit in March 2020 Oct 2019  Pt admitted for CHF (HFpEF)  LVEF 50 to 55% on echo with mid apical hyopkinesis  Gr 1 diastolic dysfunction.   She went on to have a nuclear stress test in Jan 2020  This showed mod sized defect consistent with scar, possible soft tissue attenuation (note extensive soft tissue overlying heart)    LVEF calculated at 35%    Jan 2020  Limited echo showed LVEF normal at 60 to 65% with normal wall motion   Plan was for follow up in 6 months.  It is now 5 years\  Since seen the pt denies CP   She says her breathing is fine    She continues to smoke an occasional cigarette     Denies LE edema   Denies dizziness       Current Meds  Medication Sig   alendronate (FOSAMAX) 70 MG tablet Take 70 mg by mouth every Tuesday. Take with a full glass of water  on an empty stomach. Tuesday   ALPRAZolam  (XANAX ) 0.5 MG tablet Take 0.5 mg by mouth at bedtime. Additional 0.5 mg if needed during the day (Patient taking differently: Take 0.5 mg by mouth at bedtime. Additional 0.5 mg if needed during the day every six hour as needed.)   Ascorbic Acid (VITAMIN C) 1000 MG tablet Take 1,000 mg by mouth daily.   aspirin  EC 81 MG EC tablet Take 1 tablet (81 mg total) by mouth daily.   cholecalciferol (VITAMIN D ) 25 MCG (1000 UNIT) tablet Take 1,000 mg by mouth daily. (Patient taking differently: Take 2,000 mg by mouth daily.)   ezetimibe  (ZETIA ) 10 MG tablet Take 10 mg by mouth at bedtime.   furosemide  (LASIX ) 40 MG tablet Take 60 mg by mouth 2 (two) times daily.    levothyroxine (SYNTHROID) 25 MCG tablet Take 25  mcg by mouth daily before breakfast.   Menthol, Topical Analgesic, (BIOFREEZE) 4 % GEL Apply 1 application topically daily as needed (pain).   oxyCODONE -acetaminophen  (PERCOCET) 5-325 MG per tablet Take 1 tablet by mouth every 6 (six) hours as needed for moderate pain (Back pain).    pantoprazole  (PROTONIX ) 40 MG tablet Take 1 tablet (40 mg total) by mouth daily.   polyethylene glycol (MIRALAX  / GLYCOLAX ) 17 g packet Take 17 g by mouth daily. (Patient taking differently: Take 17 g by mouth daily as needed.)   potassium chloride  (KLOR-CON ) 20 MEQ packet Take 30 mEq by mouth daily.   Quercetin 500 MG CAPS Take 500 mg by mouth daily.   simvastatin  (ZOCOR ) 40 MG tablet Take 40 mg by mouth at bedtime.   Zinc 30 MG CAPS Take 30 mg by mouth daily.     Allergies:   Other   Past Medical History:  Diagnosis Date   Anxiety    Back pain    Chronic neck pain    DDD (degenerative disc disease), cervical    Depression    DJD (degenerative joint disease)    Gait abnormality    Heart failure (  HCC) 2017   Osteoporosis     Past Surgical History:  Procedure Laterality Date   ABDOMINAL HYSTERECTOMY     BIOPSY  10/11/2021   Procedure: BIOPSY;  Surgeon: Eartha Flavors, Toribio, MD;  Location: [facility];  Service: Gastroenterology;;   CESAREAN SECTION     ESOPHAGOGASTRODUODENOSCOPY (EGD) WITH PROPOFOL  N/A 04/29/2020   Procedure: ESOPHAGOGASTRODUODENOSCOPY (EGD) WITH PROPOFOL ;  Surgeon: Golda Claudis PENNER, MD;  Location: [facility];  Service: Endoscopy;  Laterality: N/A;   ESOPHAGOGASTRODUODENOSCOPY (EGD) WITH PROPOFOL  N/A 05/01/2020   Procedure: ESOPHAGOGASTRODUODENOSCOPY (EGD) WITH PROPOFOL ;  Surgeon: Eartha Flavors Toribio, MD;  Location: [facility];  Service: Gastroenterology;  Laterality: N/A;   ESOPHAGOGASTRODUODENOSCOPY (EGD) WITH PROPOFOL  N/A 07/31/2020   Castaneda: normal esophagus, double pylorus without stigmata of recent bleeding, duodenal erosion w/o bleeding, no specimens    ESOPHAGOGASTRODUODENOSCOPY (EGD) WITH PROPOFOL  N/A 10/11/2021   Procedure: ESOPHAGOGASTRODUODENOSCOPY (EGD) WITH PROPOFOL ;  Surgeon: Eartha Flavors Toribio, MD;  Location: [facility];  Service: Gastroenterology;  Laterality: N/A;  815   HERNIA REPAIR Right    IR ANGIOGRAM SELECTIVE EACH ADDITIONAL VESSEL  05/01/2020   IR ANGIOGRAM SELECTIVE EACH ADDITIONAL VESSEL  05/01/2020   IR ANGIOGRAM VISCERAL SELECTIVE  05/01/2020   IR ANGIOGRAM VISCERAL SELECTIVE  05/01/2020   IR US  GUIDE VASC ACCESS RIGHT  05/01/2020   POLYPECTOMY  10/11/2021   Procedure: POLYPECTOMY;  Surgeon: Eartha Flavors Toribio, MD;  Location: [facility];  Service: Gastroenterology;;  gastric nodule used snare   RADIOLOGY WITH ANESTHESIA N/A 05/01/2020   Procedure: IR WITH ANESTHESIA;  Surgeon: Radiologist, Medication, MD;  Location: [facility];  Service: Radiology;  Laterality: N/A;   TUBAL LIGATION       Social History:  The patient  reports that she has quit smoking. Her smoking use included cigarettes. She has never used smokeless tobacco. She reports current alcohol use. She reports that she does not use drugs.   Family History:  The patient's family history includes Arthritis in an other family member; CAD in her father; Heart disease in an other family member.    ROS:  Please see the history of present illness. All other systems are reviewed and  Negative to the above problem except as noted.    PHYSICAL EXAM: VS:  BP 124/62   Pulse 98   Ht 5' 3 (1.6 m)   Wt 158 lb 6.4 oz (71.8 kg)   SpO2 91%   BMI 28.06 kg/m   GEN: Overweight 68 yo in no acute distress  HEENT: normal  Neck: no JVD, carotid bruits Cardiac: RRR; no murmurs  No LE  edema  Respiratory:  clear to auscultation GI: soft, nontender, no masses  No hepatomegaly  MS: no deformity Moving all extremities    EKG:  EKG is ordered today.  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 N  2.  HL  Pt has been on simvistatin   WIll get lipomed to evlauate   her LDL c was 87 in Jan 2025    3  PAD   PT with atheromatous dz of mesenteric vessels  (Underwent angio in 2021 after GI bleed)  SHe remains symptom free    Rx risk factors   3  Metabolics   A1C was 5.8 in Jan 2025   Discussed diet     Limit carbs Encouraged her to increase activity   4.  Hypothyroidism   Pt on synthroid  5  Tobacco  Still smoking some   Encouraged her to continue cutting back, quitting   Tentative follow up in 1 year   Current medicines are reviewed at length with the patient today.  The patient does not have concerns regarding medicines.  Signed, Vina Gull, MD  [facility] [address] [place], [place], [place]  [address] Phone: [phone]; Fax: [phone]

## 2024-03-23 NOTE — Patient Instructions (Addendum)
 Medication Instructions:   Your physician recommends that you continue on your current medications as directed. Please refer to the Current Medication list given to you today.  Labwork: Fasting NMR   Testing/Procedures: Your physician has requested that you have an echocardiogram. Echocardiography is a painless test that uses sound waves to create images of your heart. It provides your doctor with information about the size and shape of your heart and how well your heart's chambers and valves are working. This procedure takes approximately one hour. There are no restrictions for this procedure. Please do NOT wear cologne, perfume, aftershave, or lotions (deodorant is allowed). Please arrive 15 minutes prior to your appointment time.  Please note: We ask at that you not bring children with you during ultrasound (echo/ vascular) testing. Due to room size and safety concerns, children are not allowed in the ultrasound rooms during exams. Our front office staff cannot provide observation of children in our lobby area while testing is being conducted. An adult accompanying a patient to their appointment will only be allowed in the ultrasound room at the discretion of the ultrasound technician under special circumstances. We apologize for any inconvenience.   Follow-Up: 1 year  Any Other Special Instructions Will Be Listed Below (If Applicable).  If you need a refill on your cardiac medications before your next appointment, please call your pharmacy.+

## 2024-03-30 ENCOUNTER — Other Ambulatory Visit (HOSPITAL_COMMUNITY)
Admission: RE | Admit: 2024-03-30 | Discharge: 2024-03-30 | Disposition: A | Discharge status: Home / Self Care | Source: Ambulatory Visit | Attending: Internal Medicine | Admitting: Internal Medicine

## 2024-03-30 DIAGNOSIS — R9431 Abnormal electrocardiogram [ECG] [EKG]: Secondary | ICD-10-CM | POA: Insufficient documentation

## 2024-03-30 DIAGNOSIS — I5031 Acute diastolic (congestive) heart failure: Secondary | ICD-10-CM | POA: Diagnosis not present

## 2024-03-31 LAB — NMR, LIPOPROFILE
Cholesterol, Total: 151 mg/dL (ref 100–199)
HDL Cholesterol by NMR: 53 mg/dL (ref >39)
HDL Particle Number: 36.4 umol/L (ref >=30.5)
LDL Particle Number: 1262 nmol/L — ABNORMAL HIGH (ref <1000)
LDL Size: 20 nm — ABNORMAL LOW (ref >20.5)
LDL-C (NIH Calc): 70 mg/dL (ref 0–99)
LP-IR Score: 55 — ABNORMAL HIGH (ref <=45)
Small LDL Particle Number: 963 nmol/L — ABNORMAL HIGH (ref <=527)
Triglycerides by NMR: 169 mg/dL — ABNORMAL HIGH (ref 0–149)

## 2024-04-01 ENCOUNTER — Ambulatory Visit: Payer: Self-pay | Admitting: *Deleted

## 2024-04-04 ENCOUNTER — Other Ambulatory Visit: Payer: Self-pay

## 2024-04-04 DIAGNOSIS — Z79899 Other long term (current) drug therapy: Secondary | ICD-10-CM

## 2024-04-13 ENCOUNTER — Ambulatory Visit (HOSPITAL_COMMUNITY)
Admission: RE | Admit: 2024-04-13 | Discharge: 2024-04-13 | Disposition: A | Discharge status: Home / Self Care | Source: Ambulatory Visit | Attending: Internal Medicine | Admitting: Internal Medicine

## 2024-04-13 DIAGNOSIS — R9431 Abnormal electrocardiogram [ECG] [EKG]: Secondary | ICD-10-CM | POA: Insufficient documentation

## 2024-04-13 LAB — ECHOCARDIOGRAM COMPLETE
Area-P 1/2: 1.86 cm2
Est EF: >75
S' Lateral: 2.6 cm

## 2024-04-13 NOTE — Progress Notes (Signed)
*  PRELIMINARY RESULTS* Echocardiogram 2D Echocardiogram has been performed.  Nicole Welch 04/13/2024, 3:56 PM

## 2024-04-26 DIAGNOSIS — F419 Anxiety disorder, unspecified: Secondary | ICD-10-CM | POA: Diagnosis not present

## 2024-04-26 DIAGNOSIS — G894 Chronic pain syndrome: Secondary | ICD-10-CM | POA: Diagnosis not present

## 2024-04-26 DIAGNOSIS — E063 Autoimmune thyroiditis: Secondary | ICD-10-CM | POA: Diagnosis not present

## 2024-04-26 DIAGNOSIS — M1991 Primary osteoarthritis, unspecified site: Secondary | ICD-10-CM | POA: Diagnosis not present

## 2024-05-25 DIAGNOSIS — M1991 Primary osteoarthritis, unspecified site: Secondary | ICD-10-CM | POA: Diagnosis not present

## 2024-05-25 DIAGNOSIS — G894 Chronic pain syndrome: Secondary | ICD-10-CM | POA: Diagnosis not present

## 2024-05-25 DIAGNOSIS — M5030 Other cervical disc degeneration, unspecified cervical region: Secondary | ICD-10-CM | POA: Diagnosis not present

## 2024-07-06 ENCOUNTER — Encounter (INDEPENDENT_AMBULATORY_CARE_PROVIDER_SITE_OTHER): Payer: Self-pay | Admitting: Gastroenterology

## 2024-10-14 ENCOUNTER — Telehealth: Payer: Self-pay

## 2024-10-14 NOTE — Telephone Encounter (Signed)
 Called and informed patient per provider message below, she verbalizes understanding  Unable to refill medications for patients before they establish care. Please advise patient that we do not manage chronic narcotic pain medications and this would require a referral to pain management for ongoing refills of Percocet.

## 2024-10-14 NOTE — Telephone Encounter (Signed)
--   please advise ---     Copied from CRM #[id number]. Topic: Clinical - Medication Question >> Oct 14, 2024 10:40 AM Emylou G wrote: Reason for CRM: Patient is new to us .. still wanting to ask us  if at all possible if we can prescribe her oxyCODONE -acetaminophen  (PERCOCET) 5-325 MG per tablet until her new patient appt.. I did adv we need to see her first.. Pls call her back for any guidance.SABRA  [facility] [employer] - [place], [place] - [place] [address]

## 2024-11-09 ENCOUNTER — Ambulatory Visit: Admitting: Physician Assistant
